# Patient Record
Sex: Male | Born: 1950 | Race: White | Hispanic: No | Marital: Married | Smoking: Never smoker
Health system: Southern US, Community
[De-identification: ages and names within clinical notes are randomized; demographics above are authoritative.]

## PROBLEM LIST (undated history)

## (undated) DIAGNOSIS — I10 Essential (primary) hypertension: Secondary | ICD-10-CM

## (undated) SURGERY — Surgical Case
Anesthesia: *Unknown

---

## 2005-01-04 ENCOUNTER — Emergency Department: Payer: Self-pay | Admitting: Internal Medicine

## 2005-01-04 ENCOUNTER — Other Ambulatory Visit: Payer: Self-pay

## 2005-01-05 ENCOUNTER — Ambulatory Visit: Payer: Self-pay | Admitting: Internal Medicine

## 2008-11-24 ENCOUNTER — Ambulatory Visit: Payer: Self-pay | Admitting: Oncology

## 2008-12-01 ENCOUNTER — Inpatient Hospital Stay: Payer: Self-pay | Admitting: Internal Medicine

## 2008-12-01 ENCOUNTER — Ambulatory Visit: Payer: Self-pay | Admitting: Internal Medicine

## 2008-12-10 ENCOUNTER — Ambulatory Visit: Payer: Self-pay | Admitting: Internal Medicine

## 2008-12-13 ENCOUNTER — Ambulatory Visit: Payer: Self-pay | Admitting: Oncology

## 2008-12-24 ENCOUNTER — Ambulatory Visit: Payer: Self-pay | Admitting: Oncology

## 2009-01-24 ENCOUNTER — Ambulatory Visit: Payer: Self-pay | Admitting: Oncology

## 2009-06-24 ENCOUNTER — Ambulatory Visit: Payer: Self-pay | Admitting: Oncology

## 2009-07-04 ENCOUNTER — Ambulatory Visit: Payer: Self-pay | Admitting: Oncology

## 2009-07-24 ENCOUNTER — Ambulatory Visit: Payer: Self-pay | Admitting: Oncology

## 2014-04-06 DIAGNOSIS — E785 Hyperlipidemia, unspecified: Secondary | ICD-10-CM | POA: Insufficient documentation

## 2014-04-06 DIAGNOSIS — R7302 Impaired glucose tolerance (oral): Secondary | ICD-10-CM | POA: Insufficient documentation

## 2015-04-07 DIAGNOSIS — Z79899 Other long term (current) drug therapy: Secondary | ICD-10-CM | POA: Insufficient documentation

## 2017-07-01 DIAGNOSIS — N182 Chronic kidney disease, stage 2 (mild): Secondary | ICD-10-CM | POA: Insufficient documentation

## 2017-12-16 DIAGNOSIS — R42 Dizziness and giddiness: Secondary | ICD-10-CM | POA: Insufficient documentation

## 2018-04-04 DIAGNOSIS — D509 Iron deficiency anemia, unspecified: Secondary | ICD-10-CM | POA: Insufficient documentation

## 2018-08-02 ENCOUNTER — Other Ambulatory Visit: Payer: Self-pay

## 2018-08-02 ENCOUNTER — Encounter: Payer: Self-pay | Admitting: Emergency Medicine

## 2018-08-02 ENCOUNTER — Emergency Department
Admission: EM | Admit: 2018-08-02 | Discharge: 2018-08-02 | Disposition: A | Discharge status: Home / Self Care | Payer: Managed Care, Other (non HMO) | Attending: Emergency Medicine | Admitting: Emergency Medicine

## 2018-08-02 ENCOUNTER — Emergency Department: Payer: Managed Care, Other (non HMO)

## 2018-08-02 DIAGNOSIS — W231XXA Caught, crushed, jammed, or pinched between stationary objects, initial encounter: Secondary | ICD-10-CM | POA: Insufficient documentation

## 2018-08-02 DIAGNOSIS — Y9389 Activity, other specified: Secondary | ICD-10-CM | POA: Insufficient documentation

## 2018-08-02 DIAGNOSIS — S62665A Nondisplaced fracture of distal phalanx of left ring finger, initial encounter for closed fracture: Secondary | ICD-10-CM | POA: Diagnosis not present

## 2018-08-02 DIAGNOSIS — Y9289 Other specified places as the place of occurrence of the external cause: Secondary | ICD-10-CM | POA: Diagnosis not present

## 2018-08-02 DIAGNOSIS — Y998 Other external cause status: Secondary | ICD-10-CM | POA: Diagnosis not present

## 2018-08-02 DIAGNOSIS — S61315A Laceration without foreign body of left ring finger with damage to nail, initial encounter: Secondary | ICD-10-CM | POA: Diagnosis not present

## 2018-08-02 HISTORY — DX: Essential (primary) hypertension: I10

## 2018-08-02 MED ORDER — BUPIVACAINE HCL 0.5 % IJ SOLN
5.0000 mL | Freq: Once | INTRAMUSCULAR | Status: DC
  Filled 2018-08-02: qty 5

## 2018-08-02 MED ORDER — BUPIVACAINE HCL (PF) 0.5 % IJ SOLN
5.0000 mL | Freq: Once | INTRAMUSCULAR | Status: AC
  Administered 2018-08-02: 12:00:00 5 mL
  Filled 2018-08-02: qty 30

## 2018-08-02 MED ORDER — CEPHALEXIN 500 MG PO CAPS: 500.0000 mg | ORAL_CAPSULE | Freq: Four times a day (QID) | ORAL | 0 refills | Status: AC

## 2018-08-02 MED ORDER — CEPHALEXIN 500 MG PO CAPS
500.0000 mg | ORAL_CAPSULE | Freq: Once | ORAL | Status: AC
  Administered 2018-08-02: 500 mg via ORAL
  Filled 2018-08-02: qty 1

## 2018-08-02 NOTE — ED Provider Notes (Signed)
[facility] Emergency Department Provider Note   ____________________________________________   First MD Initiated Contact with Patient 08/02/18 1008     (approximate)  I have reviewed the triage vital signs and the nursing notes.   HISTORY  Chief Complaint Laceration    HPI Paul Mitchell is a 68 y.o. male here for evaluation for left ring finger injury  Just about 2 hours ago, patient was splitting wood when he got his left ring finger stuck between the splinter and a piece of wood.  He reports it has not been super painful, he has had injury to the right hand with loss of part of the finger in a similar situation years ago.  Reports that seem similar, he denies any injury to the hand itself or other fingers just the left ring finger got crushed between a wooden the splitter edge.  It bled some.  Denies history of any bleeding problems or diabetes.  His last tetanus shot he reports was given in January by his primary doctor.  He is not any nausea or vomiting.  He feels a little bit of tingling on the left side of the tip of the ring finger on the left, but reports that the right side of that finger he can feel normally and is not super painful.  He has not noticed a cold or blue tip of the finger.  He does not leave any of the fingertip behind, and reports it is able to bend back-and-forth okay without difficulty but obviously is lacerated.  He went to urgent care who referred him here.   Past Medical History:  Diagnosis Date  . Hypertension     There are no active problems to display for this patient.   History reviewed. No pertinent surgical history.  Prior to Admission medications   Medication Sig Start Date End Date Taking? Authorizing Provider  aspirin EC 81 MG tablet Take 81 mg by mouth daily.   Yes [provider]  hydrochlorothiazide (MICROZIDE) 12.5 MG capsule Take 12.5 mg by mouth every morning. 04/04/18  Yes [provider]  losartan (COZAAR) 100 MG tablet Take 100 mg by mouth daily. 04/04/18  Yes [provider]  cephALEXin (KEFLEX) 500 MG capsule Take 1 capsule (500 mg total) by mouth 4 (four) times daily for 10 days. 08/02/18 08/12/18  Sharyn Creamer, MD    Allergies Enalapril  No family history on file.  Social History Social History   Tobacco Use  . Smoking status: Never Smoker  . Smokeless tobacco: Never Used  Substance Use Topics  . Alcohol use: Not Currently  . Drug use: Not Currently    Review of Systems Constitutional: No fever/chills or recent illness.  Denies any coronavirus-like symptoms. Eyes: No visual changes. Cardiovascular: Denies chest pain. Respiratory: Denies shortness of breath. Genitourinary: Negative for dysuria. Musculoskeletal: Negative for back pain.  The HPI. Skin: Negative for rash. Neurological: Negative for headaches, areas of focal weakness or numbness.    ____________________________________________   PHYSICAL EXAM:  VITAL SIGNS: ED Triage Vitals  Enc Vitals Group     BP 08/02/18 1003 110/74     Pulse Rate 08/02/18 1003 75     Resp 08/02/18 1003 16     Temp 08/02/18 1003 (!) 97.5 F (36.4 C)     Temp Source 08/02/18 1003 Oral     SpO2 08/02/18 1003 97 %     Weight 08/02/18 1001 245 lb (111.1 kg)     Height  08/02/18 1001 6\' 2"  (1.88 m)     Head Circumference --      Peak Flow --      Pain Score 08/02/18 1001 0     Pain Loc --      Pain Edu? --      Excl. in GC? --     Constitutional: Alert and oriented. Well appearing and in no acute distress. Eyes: Conjunctivae are normal. Head: Atraumatic. Nose: No congestion/rhinnorhea. Mouth/Throat: Mucous membranes are moist. Neck: No stridor.  Cardiovascular: Normal rate, regular rhythm. Grossly normal heart sounds.  Good peripheral circulation. Respiratory: Normal respiratory effort.  No retractions. Lungs CTAB. Gastrointestinal: Soft and nontender. No distention. Musculoskeleta  RIGHT  Right upper extremity demonstrates normal strength, good use of all muscles. No edema bruising or contusions of the right shoulder/upper arm, right elbow, right forearm / hand. Full range of motion of the right right upper extremity without pain. No evidence of trauma. Strong radial pulse. Intact median/ulnar/radial neuro-muscular exam.  LEFT Left upper extremity demonstrates normal strength, good use of all muscles. No edema bruising or contusions of the left shoulder/upper arm, left elbow, left forearm / hand. Full range of motion of the left  upper extremity without pain kept a little bit at the distal tuft of the left ring finger.  Strong radial pulse. Intact median/ulnar/radial neuro-muscular exam.  Patient has macerated laceration that is not completely through and through, but does expose fatty tissue with an underlying fracture of the distal tuft.  Bleeding is well controlled.  Some damage to the distal nailbed the horizontal fracturing of it but the majority of the nail in the mid to nail matrix are intact.   Neurologic:  Normal speech and language. No gross focal neurologic deficits are appreciated.  Skin:  Skin is warm, dry and intact. No rash noted. Psychiatric: Mood and affect are normal. Speech and behavior are normal.  ____________________________________________   LABS (all labs ordered are listed, but only abnormal results are displayed)  Labs Reviewed - No data to display ____________________________________________  EKG   ____________________________________________  RADIOLOGY  Dg Finger Ring Left  Result Date: 08/02/2018 CLINICAL DATA:  Patient left ring finger caught between wood splitter and wood today. Now with laceration to distal end of ring finger. wood splitter vs finger, tip injury EXAM: LEFT RING FINGER 2+V COMPARISON:  None. FINDINGS: Mildly comminuted fracture of the distal aspect of the distal phalanx. Laceration in the nail bed and inferior margin of the  distal phalanx IMPRESSION: Comminuted fracture of the tuft distal phalanx. Electronically Signed   By: Genevive BiStewart  Edmunds M.D.   On: 08/02/2018 10:38    ____________________________________________   PROCEDURES  Procedure(s) performed: laceration  .Marland Kitchen.Laceration Repair Date/Time: 08/02/2018 2:53 PM Performed by: Sharyn CreamerQuale, , MD Authorized by: Sharyn CreamerQuale, , MD   Consent:    Consent obtained:  Verbal   Consent given by:  Patient   Risks discussed:  Infection, vascular damage, poor wound healing, poor cosmetic result, need for additional repair and nerve damage Anesthesia (see MAR for exact dosages):    Anesthesia method:  Nerve block   Block location:  Digital L ring   Block needle gauge:  24 G   Block anesthetic:  Bupivacaine 0.5% w/o epi   Block injection procedure:  Anatomic landmarks identified, introduced needle, incremental injection, negative aspiration for blood and anatomic landmarks palpated   Block outcome:  Anesthesia achieved Laceration details:    Location:  Finger   Finger location:  L ring finger  Length (cm):  2   Depth (mm):  5 Repair type:    Repair type:  Simple Pre-procedure details:    Preparation:  Patient was prepped and draped in usual sterile fashion and imaging obtained to evaluate for foreign bodies Exploration:    Hemostasis achieved with:  Direct pressure   Wound exploration: wound explored through full range of motion     Wound extent: areolar tissue violated, nerve damage and underlying fracture     Wound extent: no fascia violation noted, no foreign bodies/material noted, no muscle damage noted, no tendon damage noted and no vascular damage noted     Contaminated: no   Treatment:    Area cleansed with:  Betadine and soap and water   Amount of cleaning:  Extensive   Irrigation solution:  Tap water   Irrigation method:  Tap   Visualized foreign bodies/material removed: no   Skin repair:    Repair method:  Sutures   Suture size:  4-0   Suture  material:  Prolene   Suture technique:  Simple interrupted   Number of sutures: 9. Approximation:    Approximation:  Loose Post-procedure details:    Dressing:  Non-adherent dressing and splint for protection   Patient tolerance of procedure:  Tolerated well, no immediate complications Comments:     Did discuss with the patient that it does appear that he has some damage likely to the digital nerve as he has loss of sensation over one aspect of the finger.  Patient understanding, comfortable with plan for follow-up with his primary.  Buddy taped to the adjoining finger.  Repair went very well, loosely approximated.  A single suture was placed through the center of the nailbed and good approximation was made with 9 interrupted sutures.    Critical Care performed: No  ____________________________________________   INITIAL IMPRESSION / ASSESSMENT AND PLAN / ED COURSE  Pertinent labs & imaging results that were available during my care of the patient were reviewed by me and considered in my medical decision making (see chart for details).   Isolated injury to the left index finger distal.  Underlying fracture is evident consistent with his mechanism of injury.  Though the tissue is somewhat macerated, I was able to realign and get fairly good alignment considering the amount of tissue damage.  There is no obvious tendon injury and is able to flex and extend the entirety of the left ring finger.  I repaired this, discussed very careful return precautions, follow-up signs and symptoms of infection and provided antibiotic.  Patient is in good agreement.     Paul Mitchell was evaluated in Emergency Department on 08/02/2018 for the symptoms described in the history of present illness. He was evaluated in the context of the global COVID-19 pandemic, which necessitated consideration that the patient might be at risk for infection with the SARS-CoV-2 virus that causes COVID-19. Institutional protocols  and algorithms that pertain to the evaluation of patients at risk for COVID-19 are in a state of rapid change based on information released by regulatory bodies including the CDC and federal and state organizations. These policies and algorithms were followed during the patient's care in the ED.  Return precautions and treatment recommendations and follow-up discussed with the patient who is agreeable with the plan.  ____________________________________________   FINAL CLINICAL IMPRESSION(S) / ED DIAGNOSES  Final diagnoses:  Laceration of left ring finger without foreign body with damage to nail, initial encounter  Closed nondisplaced fracture of distal  phalanx of left ring finger, initial encounter        Note:  This document was prepared using Dragon voice recognition software and may include unintentional dictation errors       Sharyn Creamer, MD 08/02/18 1501

## 2018-08-02 NOTE — ED Triage Notes (Signed)
Pt to ED via POV c/o laceration to the left middle finger. Pt states that he was splitting wood this morning and accidentally cut his finger. Tip of finger is almost cut completely off. Bleeding is controlled at this time.

## 2018-08-02 NOTE — ED Notes (Signed)
Pt finger that is injured is L ring finger, NOT L middle finger.

## 2018-08-02 NOTE — Discharge Instructions (Addendum)
You have been seen in the Emergency Department (ED) today for a laceration (cut).  Please keep the cut clean but do not submerge it in the water.  It has been repaired with staples or sutures that will need to be removed in about 7-9 days. Please follow up with your doctor, an urgent care, or return to the ED for suture removal.    Please take Tylenol (acetaminophen) or Motrin (ibuprofen) as needed for discomfort as written on the box.   Please follow up with your doctor as soon as possible regarding today's emergent visit.   Return to the ED or call your doctor if you notice any signs of infection such as fever, increased pain, increased redness, pus, or other symptoms that concern you.

## 2020-02-19 ENCOUNTER — Emergency Department: Payer: Managed Care, Other (non HMO)

## 2020-02-19 ENCOUNTER — Other Ambulatory Visit: Payer: Self-pay

## 2020-02-19 ENCOUNTER — Emergency Department
Admission: EM | Admit: 2020-02-19 | Discharge: 2020-02-19 | Disposition: A | Discharge status: Home / Self Care | Payer: Managed Care, Other (non HMO) | Attending: Emergency Medicine | Admitting: Emergency Medicine

## 2020-02-19 DIAGNOSIS — R031 Nonspecific low blood-pressure reading: Secondary | ICD-10-CM | POA: Diagnosis not present

## 2020-02-19 DIAGNOSIS — Z5321 Procedure and treatment not carried out due to patient leaving prior to being seen by health care provider: Secondary | ICD-10-CM | POA: Insufficient documentation

## 2020-02-19 DIAGNOSIS — R531 Weakness: Secondary | ICD-10-CM | POA: Insufficient documentation

## 2020-02-19 LAB — BASIC METABOLIC PANEL
Anion gap: 11 (ref 5–15)
BUN: 30 mg/dL — ABNORMAL HIGH (ref 8–23)
CO2: 26 mmol/L (ref 22–32)
Calcium: 8.4 mg/dL — ABNORMAL LOW (ref 8.9–10.3)
Chloride: 95 mmol/L — ABNORMAL LOW (ref 98–111)
Creatinine, Ser: 1.65 mg/dL — ABNORMAL HIGH (ref 0.61–1.24)
GFR, Estimated: 45 mL/min — ABNORMAL LOW (ref >60)
Glucose, Bld: 103 mg/dL — ABNORMAL HIGH (ref 70–99)
Potassium: 3.6 mmol/L (ref 3.5–5.1)
Sodium: 132 mmol/L — ABNORMAL LOW (ref 135–145)

## 2020-02-19 LAB — CBC
HCT: 41.5 % (ref 39.0–52.0)
Hemoglobin: 14.2 g/dL (ref 13.0–17.0)
MCH: 30.6 pg (ref 26.0–34.0)
MCHC: 34.2 g/dL (ref 30.0–36.0)
MCV: 89.4 fL (ref 80.0–100.0)
Platelets: 126 10*3/uL — ABNORMAL LOW (ref 150–400)
RBC: 4.64 MIL/uL (ref 4.22–5.81)
RDW: 13.7 % (ref 11.5–15.5)
WBC: 5.1 10*3/uL (ref 4.0–10.5)
nRBC: 0 % (ref 0.0–0.2)

## 2020-02-19 NOTE — ED Triage Notes (Signed)
Pt comes via WC from [facility] with c/o weakness. Pt states he has bee nfeeling weak for few days. Pt states he went to walk in clinic to get checked out. Pt states they told him his BP was low and his O2.   Pt denies any CP or SOB, N/V/D.  Pt arrived with 2L [place] unsure what O2 reading was from [facility].

## 2020-02-22 ENCOUNTER — Telehealth: Payer: Self-pay | Admitting: Emergency Medicine

## 2020-02-22 NOTE — Telephone Encounter (Signed)
Called patient due to left emergency department before provider exam to inquire about condition and follow up plans. He says he has called his pcp this am and is going at 1pm today.

## 2020-02-24 ENCOUNTER — Inpatient Hospital Stay
Admission: EM | Admit: 2020-02-24 | Discharge: 2020-02-29 | DRG: 177 | Disposition: A | Discharge status: Home Health | Payer: Managed Care, Other (non HMO) | Attending: Internal Medicine | Admitting: Internal Medicine

## 2020-02-24 ENCOUNTER — Emergency Department: Payer: Managed Care, Other (non HMO)

## 2020-02-24 ENCOUNTER — Encounter: Payer: Self-pay | Admitting: Intensive Care

## 2020-02-24 ENCOUNTER — Other Ambulatory Visit: Payer: Self-pay

## 2020-02-24 DIAGNOSIS — G8929 Other chronic pain: Secondary | ICD-10-CM | POA: Diagnosis present

## 2020-02-24 DIAGNOSIS — J1282 Pneumonia due to coronavirus disease 2019: Secondary | ICD-10-CM | POA: Diagnosis present

## 2020-02-24 DIAGNOSIS — A419 Sepsis, unspecified organism: Secondary | ICD-10-CM | POA: Diagnosis not present

## 2020-02-24 DIAGNOSIS — N179 Acute kidney failure, unspecified: Secondary | ICD-10-CM | POA: Diagnosis not present

## 2020-02-24 DIAGNOSIS — I10 Essential (primary) hypertension: Secondary | ICD-10-CM | POA: Diagnosis present

## 2020-02-24 DIAGNOSIS — J9611 Chronic respiratory failure with hypoxia: Secondary | ICD-10-CM | POA: Insufficient documentation

## 2020-02-24 DIAGNOSIS — U071 COVID-19: Secondary | ICD-10-CM | POA: Diagnosis present

## 2020-02-24 DIAGNOSIS — Z7982 Long term (current) use of aspirin: Secondary | ICD-10-CM

## 2020-02-24 DIAGNOSIS — Z79899 Other long term (current) drug therapy: Secondary | ICD-10-CM | POA: Diagnosis not present

## 2020-02-24 DIAGNOSIS — R7989 Other specified abnormal findings of blood chemistry: Secondary | ICD-10-CM | POA: Diagnosis not present

## 2020-02-24 DIAGNOSIS — I824Y2 Acute embolism and thrombosis of unspecified deep veins of left proximal lower extremity: Secondary | ICD-10-CM | POA: Diagnosis not present

## 2020-02-24 DIAGNOSIS — J9601 Acute respiratory failure with hypoxia: Secondary | ICD-10-CM | POA: Diagnosis present

## 2020-02-24 DIAGNOSIS — I4891 Unspecified atrial fibrillation: Secondary | ICD-10-CM | POA: Diagnosis not present

## 2020-02-24 LAB — CBC
HCT: 40.6 % (ref 39.0–52.0)
Hemoglobin: 13.9 g/dL (ref 13.0–17.0)
MCH: 30.1 pg (ref 26.0–34.0)
MCHC: 34.2 g/dL (ref 30.0–36.0)
MCV: 87.9 fL (ref 80.0–100.0)
Platelets: 196 10*3/uL (ref 150–400)
RBC: 4.62 MIL/uL (ref 4.22–5.81)
RDW: 13.3 % (ref 11.5–15.5)
WBC: 6.7 10*3/uL (ref 4.0–10.5)
nRBC: 0 % (ref 0.0–0.2)

## 2020-02-24 LAB — BASIC METABOLIC PANEL
Anion gap: 10 (ref 5–15)
BUN: 22 mg/dL (ref 8–23)
CO2: 26 mmol/L (ref 22–32)
Calcium: 7.8 mg/dL — ABNORMAL LOW (ref 8.9–10.3)
Chloride: 99 mmol/L (ref 98–111)
Creatinine, Ser: 1.09 mg/dL (ref 0.61–1.24)
GFR, Estimated: >60 mL/min (ref >60)
Glucose, Bld: 124 mg/dL — ABNORMAL HIGH (ref 70–99)
Potassium: 3.5 mmol/L (ref 3.5–5.1)
Sodium: 135 mmol/L (ref 135–145)

## 2020-02-24 LAB — RESP PANEL BY RT-PCR (FLU A&B, COVID) ARPGX2
Influenza A by PCR: NEGATIVE
Influenza B by PCR: NEGATIVE
SARS Coronavirus 2 by RT PCR: POSITIVE — AB

## 2020-02-24 LAB — LACTIC ACID, PLASMA: Lactic Acid, Venous: 1.7 mmol/L (ref 0.5–1.9)

## 2020-02-24 LAB — PROCALCITONIN: Procalcitonin: 0.1 ng/mL

## 2020-02-24 LAB — FIBRIN DERIVATIVES D-DIMER (ARMC ONLY): Fibrin derivatives D-dimer (ARMC): 1281.86 ng/mL (FEU) — ABNORMAL HIGH (ref 0.00–499.00)

## 2020-02-24 MED ORDER — GUAIFENESIN-DM 100-10 MG/5ML PO SYRP: 10.0000 mL | ORAL_SOLUTION | ORAL | Status: DC | PRN

## 2020-02-24 MED ORDER — HYDROCOD POLST-CPM POLST ER 10-8 MG/5ML PO SUER
5.0000 mL | Freq: Two times a day (BID) | ORAL | Status: DC | PRN
  Administered 2020-02-26: 5 mL via ORAL
  Filled 2020-02-24: qty 5

## 2020-02-24 MED ORDER — SODIUM CHLORIDE 0.9 % IV SOLN
100.0000 mg | Freq: Every day | INTRAVENOUS | Status: AC
  Administered 2020-02-25 – 2020-02-28 (×4): 100 mg via INTRAVENOUS
  Filled 2020-02-24 (×5): qty 20

## 2020-02-24 MED ORDER — METHYLPREDNISOLONE SODIUM SUCC 125 MG IJ SOLR
1.0000 mg/kg | Freq: Two times a day (BID) | INTRAMUSCULAR | Status: AC
  Administered 2020-02-24 – 2020-02-27 (×6): 111.25 mg via INTRAVENOUS
  Filled 2020-02-24 (×6): qty 2

## 2020-02-24 MED ORDER — ONDANSETRON HCL 4 MG/2ML IJ SOLN: 4.0000 mg | Freq: Four times a day (QID) | INTRAMUSCULAR | Status: DC | PRN

## 2020-02-24 MED ORDER — DEXAMETHASONE SODIUM PHOSPHATE 10 MG/ML IJ SOLN
10.0000 mg | Freq: Once | INTRAMUSCULAR | Status: AC
  Administered 2020-02-24: 10 mg via INTRAVENOUS
  Filled 2020-02-24: qty 1

## 2020-02-24 MED ORDER — ONDANSETRON HCL 4 MG PO TABS: 4.0000 mg | ORAL_TABLET | Freq: Four times a day (QID) | ORAL | Status: DC | PRN

## 2020-02-24 MED ORDER — ACETAMINOPHEN 325 MG PO TABS: 325.0000 mg | ORAL_TABLET | Freq: Four times a day (QID) | ORAL | Status: DC | PRN

## 2020-02-24 MED ORDER — SODIUM CHLORIDE 0.9 % IV SOLN
200.0000 mg | Freq: Once | INTRAVENOUS | Status: AC
  Administered 2020-02-24: 200 mg via INTRAVENOUS
  Filled 2020-02-24: qty 40

## 2020-02-24 MED ORDER — PREDNISONE 50 MG PO TABS
50.0000 mg | ORAL_TABLET | Freq: Every day | ORAL | Status: DC
  Administered 2020-02-27 – 2020-02-29 (×3): 50 mg via ORAL
  Filled 2020-02-24 (×3): qty 1

## 2020-02-24 MED ORDER — ENOXAPARIN SODIUM 60 MG/0.6ML ~~LOC~~ SOLN
0.5000 mg/kg | SUBCUTANEOUS | Status: DC
  Administered 2020-02-24 – 2020-02-28 (×5): 55 mg via SUBCUTANEOUS
  Filled 2020-02-24 (×6): qty 0.6

## 2020-02-24 MED ORDER — LACTATED RINGERS IV BOLUS
1000.0000 mL | Freq: Once | INTRAVENOUS | Status: AC
  Administered 2020-02-24: 1000 mL via INTRAVENOUS

## 2020-02-24 MED ORDER — ASPIRIN EC 81 MG PO TBEC
81.0000 mg | DELAYED_RELEASE_TABLET | Freq: Every day | ORAL | Status: DC
  Administered 2020-02-25 – 2020-02-29 (×5): 81 mg via ORAL
  Filled 2020-02-24 (×5): qty 1

## 2020-02-24 MED ORDER — LOSARTAN POTASSIUM 50 MG PO TABS
100.0000 mg | ORAL_TABLET | Freq: Every day | ORAL | Status: DC
  Administered 2020-02-26 – 2020-02-29 (×3): 100 mg via ORAL
  Filled 2020-02-24 (×5): qty 2

## 2020-02-24 MED ORDER — HYDROCHLOROTHIAZIDE 12.5 MG PO CAPS
12.5000 mg | ORAL_CAPSULE | Freq: Every morning | ORAL | Status: DC
  Administered 2020-02-25 – 2020-02-29 (×4): 12.5 mg via ORAL
  Filled 2020-02-24 (×5): qty 1

## 2020-02-24 NOTE — H&P (Signed)
History and Physical   Paul Mitchell:[id number] DOB: 07/27/1950 DOA: 02/24/2020  PCP: [facility] Specialists: Dr. Bennie [facility] Patient coming from: home   I have personally briefly reviewed patient's old medical records in [facility] EMR.  Chief Concern: worsening shortness of breath since Monday, 02/22/20.  HPI: Paul Mitchell is a 69 y.o. male with medical history significant for hypertension presents to the ED with chief concerns of shortness of breath and low energy on Monday, 02/22/20 and was tested positive for [facility].   He reports he was tested for Covid on 02/22/2020 and results came back today showing that he tested positive for Covid.  He felt worsening shortness of breath and therefore prompted him to present to the ED for further evaluation.  He reports improved symptoms with O2 supplementations in the emergency department.  ROS denies fever, headache, vision changes, chest pain, abdominal pain, nausea, vomiting, hearing loss, dysphagia, odynophagia, dysuria, hematuria, hematuria, diarrhea, constipation, blood in stool. He endorses intact sense of smell and taste. He denies extremity weakness. He denies muscle aches.   [name] endorses that he will get vaccinated for COVID-19 after discharge.   ED Course: Cussed with ED provider, requesting admission for COVID-19 pneumonia causing acute hypoxic respiratory failure.  Review of Systems: As per HPI otherwise 10 point review of systems negative.  Assessment/Plan  Principal Problem:   Acute hypoxemic respiratory failure due to COVID-19 [facility]) Active Problems:   Essential hypertension   Acute hypoxic respiratory failure secondary to covid 19  - Remdesivir pharmacy consult and decadron per ED provider - Continue remdesivir and solumedrol IV - Admit to inpatient progressive cardiac -Continue high flow nasal cannula to maintain SPO2 greater than  92%  Hypertension - resumed home hctz 12.5 mg daily and losartan 100 mg daily  Sinus tachycardia secondary to acute hypoxic respiratory failure secondary to covid 19 - Treat as above   Chart reviewed.   DVT prophylaxis: enoxaparin 55 mg subcutaneous Code Status: full code Diet: heart healthy Family Communication: updated spouse, Mrs. Tenoch Mcclure. Mrs. Latin states she has covid infection as well  Disposition Plan: pending clinical course Consults called: not at this time Admission status: inpatient to progressive cardiac  Past Medical History:  Diagnosis Date  . Hypertension    History reviewed. No pertinent surgical history.  Social History:  reports that he has never smoked. He has never used smokeless tobacco. He reports previous alcohol use. He reports previous drug use.  Allergies  Allergen Reactions  . Enalapril Cough   History reviewed. No pertinent family history. Family history: Family history reviewed and not pertinent  Prior to Admission medications   Medication Sig Start Date End Date Taking? Authorizing Provider  aspirin EC 81 MG tablet Take 81 mg by mouth daily.    [provider]  hydrochlorothiazide (MICROZIDE) 12.5 MG capsule Take 12.5 mg by mouth every morning. 04/04/18   [provider]  losartan (COZAAR) 100 MG tablet Take 100 mg by mouth daily. 04/04/18   [provider]   Physical Exam: Vitals:   02/24/20 1514 02/24/20 1530 02/24/20 1535 02/24/20 1645  BP:   135/71 116/70  Pulse:   99 97  Resp:   (!) 22 (!) 28  Temp:      TempSrc:      SpO2: (!) 88% 95% 91% 94%  Weight:      Height:       Constitutional: appears age appropriate, NAD, calm,  comfortable Eyes: PERRL, lids and conjunctivae normal ENMT: Mucous membranes are moist. Posterior pharynx clear of any exudate or lesions. Age-appropriate dentition. Hearing appropriate Neck: normal, supple, no masses, no thyromegaly Respiratory: bilateral decreased breath sounds.  Normal respiratory effort. mild accessory muscle use.  Cardiovascular: Regular rate and rhythm, no murmurs / rubs / gallops. No extremity edema. 2+ pedal pulses. No carotid bruits.  Abdomen: no tenderness, no masses palpated, no hepatosplenomegaly. Bowel sounds positive.  Musculoskeletal: no clubbing / cyanosis. No joint deformity upper and lower extremities. Good ROM, no contractures, no atrophy. Normal muscle tone.  Skin: no rashes, lesions, ulcers. No induration Neurologic: Sensation intact. Strength 5/5 in all 4.  Psychiatric: Normal judgment and insight. Alert and oriented x 3. Normal mood.   EKG: Independently reviewed, showing sinus tachycardia with rate of 101 and Qtc 444  Chest x-ray on Admission: Personally reviewed and I agree with radiologist reading as below.  DG Chest Port 1 View  Result Date: 02/24/2020 CLINICAL DATA:  69 year old male with shortness of breath. EXAM: PORTABLE CHEST 1 VIEW COMPARISON:  Chest radiograph dated 02/19/2020. FINDINGS: Significant interval worsening of bilateral pulmonary infiltrate compared to prior radiograph in keeping with worsening multifocal pneumonia. Clinical correlation and follow-up to resolution recommended. No pleural effusion pneumothorax. Stable cardiac silhouette. Atherosclerotic calcification of the aorta. No acute osseous pathology. IMPRESSION: Worsening multifocal pneumonia. Electronically Signed   By: Elgie Collard M.D.   On: 02/24/2020 15:34   Labs on Admission: I have personally reviewed following labs  CBC: Recent Labs  Lab 02/19/20 1253 02/24/20 1528  WBC 5.1 6.7  HGB 14.2 13.9  HCT 41.5 40.6  MCV 89.4 87.9  PLT 126* 196   Basic Metabolic Panel: Recent Labs  Lab 02/19/20 1253 02/24/20 1528  NA 132* 135  K 3.6 3.5  CL 95* 99  CO2 26 26  GLUCOSE 103* 124*  BUN 30* 22  CREATININE 1.65* 1.09  CALCIUM 8.4* 7.8*   Zayn Selley N Dorianne Perret D.O. [facility]  If 12AM-7AM, please contact overnight-coverage provider If  7AM-7PM, please contact day coverage provider [email or web address]  02/24/2020, 5:09 PM

## 2020-02-24 NOTE — ED Provider Notes (Signed)
[facility] Emergency Department Provider Note   ____________________________________________   First MD Initiated Contact with Patient 02/24/20 1502     (approximate)  I have reviewed the triage vital signs and the nursing notes.   HISTORY  Chief Complaint Shortness of Breath and Covid Positive    HPI Paul Mitchell is a 69 y.o. male history of hypertension  Patient comes for evaluation due to concerns of COVID-19.  Reports shortness of breath and fatigue worsening over the last 2 days.  Symptoms were support started just a couple of days ago, tested positive for Covid at his doctor's office 2 days ago  No chest pain.  Does report shortness of breath.  Improved on oxygen.  No muscle aches or pains.  Reports he just has a dry cough and shortness of breath that are worsening   Past Medical History:  Diagnosis Date  . Hypertension     Patient Active Problem List   Diagnosis Date Noted  . Acute hypoxemic respiratory failure due to COVID-19 [facility]) 02/24/2020    History reviewed. No pertinent surgical history.  Prior to Admission medications   Medication Sig Start Date End Date Taking? Authorizing Provider  aspirin EC 81 MG tablet Take 81 mg by mouth daily.    [provider]  hydrochlorothiazide (MICROZIDE) 12.5 MG capsule Take 12.5 mg by mouth every morning. 04/04/18   [provider]  losartan (COZAAR) 100 MG tablet Take 100 mg by mouth daily. 04/04/18   [provider]    Allergies Enalapril  History reviewed. No pertinent family history.  Social History Social History   Tobacco Use  . Smoking status: Never Smoker  . Smokeless tobacco: Never Used  Substance Use Topics  . Alcohol use: Not Currently  . Drug use: Not Currently    Review of Systems Constitutional: No fever/chills but feeling fatigued Eyes: No visual changes. ENT: No sore throat. Cardiovascular: Denies chest pain. Respiratory: See  HPI Gastrointestinal: No abdominal pain.   Genitourinary: Negative for dysuria. Musculoskeletal: Negative for back pain. Skin: Negative for rash. Neurological: Negative for headaches, areas of focal weakness or numbness.    ____________________________________________   PHYSICAL EXAM:  VITAL SIGNS: ED Triage Vitals  Enc Vitals Group     BP 02/24/20 1456 111/68     Pulse Rate 02/24/20 1456 (!) 110     Resp 02/24/20 1456 (!) 27     Temp 02/24/20 1456 99.8 F (37.7 C)     Temp Source 02/24/20 1456 Oral     SpO2 02/24/20 1456 (!) 79 %     Weight 02/24/20 1506 245 lb (111.1 kg)     Height 02/24/20 1506 6\' 3"  (1.905 m)     Head Circumference --      Peak Flow --      Pain Score 02/24/20 1456 0     Pain Loc --      Pain Edu? --      Excl. in GC? --     Constitutional: Alert and oriented.  Teague in appearance slight increased work of breathing but no frank distress Eyes: Conjunctivae are normal. Head: Atraumatic. Nose: No congestion/rhinnorhea. Mouth/Throat: Mucous membranes are moist. Neck: No stridor.  Cardiovascular: Slightly tachycardic rate, regular rhythm. Grossly normal heart sounds.  Good peripheral circulation. Respiratory: Moderately tachypneic, lung sounds clear bilaterally.  Mild use of accessory muscles.  Of note patient hypoxic saturations less than 80% on room air, correct to low 90s on 5 L nasal cannula.  Gastrointestinal: Soft and nontender. No distention. Musculoskeletal: No lower extremity tenderness nor edema. Neurologic:  Normal speech and language. No gross focal neurologic deficits are appreciated.  Skin:  Skin is warm, dry and intact. No rash noted. Psychiatric: Mood and affect are normal. Speech and behavior are normal.  ____________________________________________   LABS (all labs ordered are listed, but only abnormal results are displayed)  Labs Reviewed  BASIC METABOLIC PANEL - Abnormal; Notable for the following components:      Result Value    Glucose, Bld 124 (*)    Calcium 7.8 (*)    All other components within normal limits  CULTURE, BLOOD (ROUTINE X 2)  CULTURE, BLOOD (ROUTINE X 2)  CBC  LACTIC ACID, PLASMA  PROCALCITONIN   ____________________________________________  EKG  Reviewed interpreted by me at 1520 Heart rate 100 QRS 110 QTc 440 Sinus tachycardia, no evidence of acute ischemia. ____________________________________________  RADIOLOGY  DG Chest Port 1 View  Result Date: 02/24/2020 CLINICAL DATA:  69 year old male with shortness of breath. EXAM: PORTABLE CHEST 1 VIEW COMPARISON:  Chest radiograph dated 02/19/2020. FINDINGS: Significant interval worsening of bilateral pulmonary infiltrate compared to prior radiograph in keeping with worsening multifocal pneumonia. Clinical correlation and follow-up to resolution recommended. No pleural effusion pneumothorax. Stable cardiac silhouette. Atherosclerotic calcification of the aorta. No acute osseous pathology. IMPRESSION: Worsening multifocal pneumonia. Electronically Signed   By: Elgie Collard M.D.   On: 02/24/2020 15:34    Chest x-ray reviewed concerning for multifocal pneumonia and infiltrate. ____________________________________________   PROCEDURES  Procedure(s) performed: None  Procedures  Critical Care performed: Yes, see critical care note(s)  CRITICAL CARE Performed by: Sharyn Creamer   Total critical care time: 30 minutes  Critical care time was exclusive of separately billable procedures and treating other patients.  Critical care was necessary to treat or prevent imminent or life-threatening deterioration.  Critical care was time spent personally by me on the following activities: development of treatment plan with patient and/or surrogate as well as nursing, discussions with consultants, evaluation of patient's response to treatment, examination of patient, obtaining history from patient or surrogate, ordering and performing treatments  and interventions, ordering and review of laboratory studies, ordering and review of radiographic studies, pulse oximetry and re-evaluation of patient's condition.  ____________________________________________   INITIAL IMPRESSION / ASSESSMENT AND PLAN / ED COURSE  Pertinent labs & imaging results that were available during my care of the patient were reviewed by me and considered in my medical decision making (see chart for details).   Patient Modena Jansky for dyspnea, found to be notably hypoxic.  Known COVID-19 tested positive just couple days ago in [employer] reviewed.  He has not initiated any Covid treatments.  Discussed with the patient he is agreeable to proceed with remdesivir and steroids  Additionally will rule out bacterial etiologies though I have a low suspicion for this.  Awaiting procalcitonin, additionally awaiting blood cultures was have been ordered.  Fluid bolus lactic acid pending.  Admission discussed with Dr. Sedalia Muta.  Test such as procalcitonin blood culture results etc. are pending at time of admission decision  ----------------------------------------- 4:21 PM on 02/24/2020 -----------------------------------------  Patient respiratory status improving.  Tolerating high flow heated oxygenation well, respiratory rate has improved markedly and his work of breathing appears much more comfortable.  Patient comfortable, awaiting admission to hospital      ____________________________________________   FINAL CLINICAL IMPRESSION(S) / ED DIAGNOSES  Final diagnoses:  Sepsis, due to unspecified organism, unspecified whether acute organ  dysfunction present [facility])  Pneumonia due to COVID-19 virus        Note:  This document was prepared using Dragon voice recognition software and may include unintentional dictation errors       Sharyn Creamer, MD 02/24/20 1621

## 2020-02-24 NOTE — Progress Notes (Signed)
PHARMACIST - PHYSICIAN COMMUNICATION  CONCERNING:  Enoxaparin (Lovenox) for DVT Prophylaxis    RECOMMENDATION: Patient was prescribed enoxaparin 40 mg q24 hours for VTE prophylaxis.   Filed Weights   02/24/20 1506  Weight: 111.1 kg (245 lb)    Body mass index is 30.62 kg/m.  Estimated Creatinine Clearance: 88.5 mL/min (by C-G formula based on SCr of 1.09 mg/dL).   Based on [facility] policy patient is candidate for enoxaparin 0.5mg /kg TBW SQ every 24 hours based on BMI being >30.   DESCRIPTION: Pharmacy has adjusted enoxaparin dose per [facility] policy.  Patient is now receiving enoxaparin 55 mg every 24 hours    Pricilla Riffle, PharmD Clinical Pharmacist  02/24/2020 4:37 PM

## 2020-02-24 NOTE — ED Triage Notes (Signed)
Patient arrived sob from home. Tested for covid X2 days ago and results came back today positive. Reports O2 at home 83-87%. Patient has been feeling sick since 02/17/2020.

## 2020-02-24 NOTE — Consult Note (Signed)
Remdesivir - Pharmacy Brief Note   O:  CXR: significant worsening of bilateral pulmonary infiltration c/f multifocal PNA or Covid PNA SpO2: 91% on 40+ HFNC   A/P:  Remdesivir 200 mg IVPB once followed by 100 mg IVPB daily x 4 days.   .me 02/24/2020 3:57 PM

## 2020-02-24 NOTE — ED Notes (Signed)
RT called for heated high flow oxygen

## 2020-02-25 DIAGNOSIS — U071 COVID-19: Secondary | ICD-10-CM | POA: Diagnosis not present

## 2020-02-25 DIAGNOSIS — J9601 Acute respiratory failure with hypoxia: Secondary | ICD-10-CM | POA: Diagnosis not present

## 2020-02-25 LAB — C-REACTIVE PROTEIN: CRP: 7.1 mg/dL — ABNORMAL HIGH (ref <1.0)

## 2020-02-25 LAB — HIV ANTIBODY (ROUTINE TESTING W REFLEX): HIV Screen 4th Generation wRfx: NONREACTIVE

## 2020-02-25 MED ORDER — BARICITINIB 2 MG PO TABS
2.0000 mg | ORAL_TABLET | Freq: Every day | ORAL | Status: DC
  Filled 2020-02-25: qty 1

## 2020-02-25 MED ORDER — ORAL CARE MOUTH RINSE
15.0000 mL | Freq: Two times a day (BID) | OROMUCOSAL | Status: DC
  Administered 2020-02-25 – 2020-02-29 (×7): 15 mL via OROMUCOSAL

## 2020-02-25 MED ORDER — BARICITINIB 2 MG PO TABS
4.0000 mg | ORAL_TABLET | Freq: Every day | ORAL | Status: DC
  Administered 2020-02-25 – 2020-02-29 (×5): 4 mg via ORAL
  Filled 2020-02-25 (×5): qty 2

## 2020-02-25 MED ORDER — FUROSEMIDE 10 MG/ML IJ SOLN
40.0000 mg | Freq: Once | INTRAMUSCULAR | Status: AC
  Administered 2020-02-25: 40 mg via INTRAVENOUS
  Filled 2020-02-25: qty 4

## 2020-02-25 NOTE — Progress Notes (Signed)
PROGRESS NOTE    Paul Mitchell  ZJI:[id number] DOB: 25-May-1950 DOA: 02/24/2020 PCP: Patient, No Pcp Per   Brief Narrative:   69 y.o. male with medical history significant for hypertension presents to the ED with chief concerns of shortness of breath and low energy on Monday, 02/22/20 and was tested positive for [facility].   He reports he was tested for Covid on 02/22/2020 and results came back today showing that he tested positive for Covid.  He felt worsening shortness of breath and therefore prompted him to present to the ED for further evaluation.  12/2: Patient has been on heated high flow nasal cannula at 60 L overnight.  Saturations low to mid 90s on this rate.  Patient reports improvement in respiratory symptoms since initiation of antivirals, steroids, supplemental oxygen   Assessment & Plan:   Principal Problem:   Acute hypoxemic respiratory failure due to COVID-19 [facility]) Active Problems:   Essential hypertension  Multifocal pneumonia secondary to COVID-19 Acute hypoxic respiratory failure secondary to above Patient has required heated high flow nasal cannula since admission Normal work of breathing, intact mentation Chest x-ray findings consistent with multifocal infiltrate Procalcitonin negative, do not suspect bacterial coinfection Plan: Continue remdesivir, day 2/5 Continue steroids, day 2/10 Add baricitinib, day 1/14 High rate oxygen, wean as tolerated Stress incentive spirometry and flutter valve use Prone as tolerated Diet laboratory markers Lasix 40 mg IV x1 today, reassess daily for diuretic need and tolerance  Essential hypertension Continue home hydrochlorothiazide 12.5 mg daily Normal start 100 mg daily  Tachycardia Sinus rhythm on telemetry Suspect secondary to hypoxia and acute illness Treat acute issues as above  DVT prophylaxis: Lovenox Code Status: Full code Family Communication: None today.  Offered to call patient's spouse but he  declined.  Will revisit tomorrow Disposition Plan: Status is: Inpatient  Remains inpatient appropriate because:Inpatient level of care appropriate due to severity of illness   Dispo: The patient is from: Home              Anticipated d/c is to: Home              Anticipated d/c date is: > 3 days              Patient currently is not medically stable to d/c.   Remains markedly hypoxic dependent on heated high flow nasal cannula.  Not medically stable for discharge      Consultants:   None  Procedures:   None  Antimicrobials:   Remdesivir   Subjective: Patient seen and examined.  Reports improvement in respiratory status since admission.  Reports chronic pain in left hip.  Otherwise asymptomatic  Objective: Vitals:   02/25/20 1200 02/25/20 1224 02/25/20 1230 02/25/20 1300  BP: 111/66 114/66 104/71 113/77  Pulse: 89 80 81 79  Resp: (!) 23 (!) 21 18 (!) 24  Temp:  98 F (36.7 C)    TempSrc:  Oral    SpO2: 94% 95% 94% 99%  Weight:      Height:        Intake/Output Summary (Last 24 hours) at 02/25/2020 1338 Last data filed at 02/25/2020 1200 Gross per 24 hour  Intake 99.93 ml  Output 550 ml  Net -450.07 ml   Filed Weights   02/24/20 1506  Weight: 111.1 kg    Examination:  General exam: Appears calm and comfortable  Respiratory system: Coarse crackles bilaterally.  Normal work of breathing.  60 L Cardiovascular system: S1 & S2 heard,  RRR. No JVD, murmurs, rubs, gallops or clicks. No pedal edema. Gastrointestinal system: Abdomen is nondistended, soft and nontender. No organomegaly or masses felt. Normal bowel sounds heard. Central nervous system: Alert and oriented. No focal neurological deficits. Extremities: Symmetric 5 x 5 power. Skin: No rashes, lesions or ulcers Psychiatry: Judgement and insight appear normal. Mood & affect appropriate.     Data Reviewed: I have personally reviewed following labs and imaging studies  CBC: Recent Labs  Lab  02/19/20 1253 02/24/20 1528  WBC 5.1 6.7  HGB 14.2 13.9  HCT 41.5 40.6  MCV 89.4 87.9  PLT 126* 196   Basic Metabolic Panel: Recent Labs  Lab 02/19/20 1253 02/24/20 1528  NA 132* 135  K 3.6 3.5  CL 95* 99  CO2 26 26  GLUCOSE 103* 124*  BUN 30* 22  CREATININE 1.65* 1.09  CALCIUM 8.4* 7.8*   GFR: Estimated Creatinine Clearance: 88.5 mL/min (by C-G formula based on SCr of 1.09 mg/dL). Liver Function Tests: No results for input(s): AST, ALT, ALKPHOS, BILITOT, PROT, ALBUMIN in the last 168 hours. No results for input(s): LIPASE, AMYLASE in the last 168 hours. No results for input(s): AMMONIA in the last 168 hours. Coagulation Profile: No results for input(s): INR, PROTIME in the last 168 hours. Cardiac Enzymes: No results for input(s): CKTOTAL, CKMB, CKMBINDEX, TROPONINI in the last 168 hours. BNP (last 3 results) No results for input(s): PROBNP in the last 8760 hours. HbA1C: No results for input(s): HGBA1C in the last 72 hours. CBG: No results for input(s): GLUCAP in the last 168 hours. Lipid Profile: No results for input(s): CHOL, HDL, LDLCALC, TRIG, CHOLHDL, LDLDIRECT in the last 72 hours. Thyroid Function Tests: No results for input(s): TSH, T4TOTAL, FREET4, T3FREE, THYROIDAB in the last 72 hours. Anemia Panel: No results for input(s): VITAMINB12, FOLATE, FERRITIN, TIBC, IRON, RETICCTPCT in the last 72 hours. Sepsis Labs: Recent Labs  Lab 02/24/20 1528 02/24/20 1544  PROCALCITON 0.10  --   LATICACIDVEN  --  1.7    Recent Results (from the past 240 hour(s))  Culture, blood (Routine X 2) w Reflex to ID Panel     Status: None (Preliminary result)   Collection Time: 02/24/20  3:44 PM   Specimen: BLOOD  Result Value Ref Range Status   Specimen Description BLOOD RIGHT ANTECUBITAL  Final   Special Requests   Final    BOTTLES DRAWN AEROBIC AND ANAEROBIC Blood Culture adequate volume   Culture   Final    NO GROWTH < 24 HOURS Performed at [facility],  [address] [place], [place] [address]    Report Status PENDING  Incomplete  Culture, blood (Routine X 2) w Reflex to ID Panel     Status: None (Preliminary result)   Collection Time: 02/24/20  3:44 PM   Specimen: BLOOD  Result Value Ref Range Status   Specimen Description BLOOD LEFT ANTECUBITAL  Final   Special Requests   Final    BOTTLES DRAWN AEROBIC AND ANAEROBIC Blood Culture results may not be optimal due to an excessive volume of blood received in culture bottles   Culture   Final    NO GROWTH < 24 HOURS Performed at [facility] [address] [place], [place] [address]    Report Status PENDING  Incomplete  Resp Panel by RT-PCR (Flu A&B, Covid)     Status: Abnormal   Collection Time: 02/24/20  8:23 PM  Result Value Ref Range Status   SARS Coronavirus 2  by RT PCR POSITIVE (A) NEGATIVE Final    Comment: RESULT CALLED TO, READ BACK BY AND VERIFIED WITH: ZAC REGISTER @2150  ON 02/24/20 SKL (NOTE) SARS-CoV-2 target nucleic acids are DETECTED.  The SARS-CoV-2 RNA is generally detectable in upper respiratory specimens during the acute phase of infection. Positive results are indicative of the presence of the identified virus, but do not rule out bacterial infection or co-infection with other pathogens not detected by the test. Clinical correlation with patient history and other diagnostic information is necessary to determine patient infection status. The expected result is Negative.  Fact Sheet for Patients: 14/01/21  Fact Sheet for Healthcare Providers: BloggerCourse.com  This test is not yet approved or cleared by the [employer].[place] FDA and  has been authorized for detection and/or diagnosis of SARS-CoV-2 by FDA under an Emergency Use Authorization (EUA).  This EUA will remain in effect (meaning this test can b e used) for the duration of  the COVID-19 declaration under Section 564(b)(1) of the Act,  21 U.S.C. section 360bbb-3(b)(1), unless the authorization is terminated or revoked sooner.     Influenza A by PCR NEGATIVE NEGATIVE Final   Influenza B by PCR NEGATIVE NEGATIVE Final    Comment: (NOTE) The Xpert Xpress SARS-CoV-2/FLU/RSV plus assay is intended as an aid in the diagnosis of influenza from Nasopharyngeal swab specimens and should not be used as a sole basis for treatment. Nasal washings and aspirates are unacceptable for Xpert Xpress SARS-CoV-2/FLU/RSV testing.  Fact Sheet for Patients: [place]  Fact Sheet for Healthcare Providers: BloggerCourse.com  This test is not yet approved or cleared by the [employer]ious[employer] FDA and has been authorized for detection and/or diagnosis of SARS-CoV-2 by FDA under an Emergency Use Authorization (EUA). This EUA will remain in effect (meaning this test can be used) for the duration of the COVID-19 declaration under Section 564(b)(1) of the Act, 21 U.S.C. section 360bbb-3(b)(1), unless the authorization is terminated or revoked.  Performed at [facility], [address], [place], [place]          Radiology Studies: DG Chest [place] 1 View  Result Date: 02/24/2020 CLINICAL DATA:  69 year old male with shortness of breath. EXAM: PORTABLE CHEST 1 VIEW COMPARISON:  Chest radiograph dated 02/19/2020. FINDINGS: Significant interval worsening of bilateral pulmonary infiltrate compared to prior radiograph in keeping with worsening multifocal pneumonia. Clinical correlation and follow-up to resolution recommended. No pleural effusion pneumothorax. Stable cardiac silhouette. Atherosclerotic calcification of the aorta. No acute osseous pathology. IMPRESSION: Worsening multifocal pneumonia. Electronically Signed   By: 02/21/2020 M.D.   On: 02/24/2020 15:34        Scheduled Meds: . aspirin EC  81 mg Oral Daily  . baricitinib  4 mg Oral Daily  . enoxaparin  (LOVENOX) injection  0.5 mg/kg Subcutaneous Q24H  . hydrochlorothiazide  12.5 mg Oral q morning - 10a  . losartan  100 mg Oral Daily  . methylPREDNISolone (SOLU-MEDROL) injection  1 mg/kg Intravenous Q12H   Followed by  . [START ON 02/27/2020] predniSONE  50 mg Oral Daily   Continuous Infusions: . remdesivir 100 mg in NS 100 mL Stopped (02/25/20 1059)     LOS: 1 day    Time spent: 25 minutes    14/02/21, MD [facility] Pager [phone]  If 7PM-7AM, please contact night-coverage 02/25/2020, 1:38 PM

## 2020-02-25 NOTE — Progress Notes (Signed)
PHARMACY NOTE:  ANTIMICROBIAL RENAL DOSAGE ADJUSTMENT  Current antimicrobial regimen includes a mismatch between antimicrobial dosage and estimated renal function.  As per policy approved by the Pharmacy & Therapeutics and Medical Executive Committees, the antimicrobial dosage will be adjusted accordingly.  Current antimicrobial dosage:  baricitinib 2mg   Indication: COVID  Renal Function:  GFR now > 60  Estimated Creatinine Clearance: 88.5 mL/min (by C-G formula based on SCr of 1.09 mg/dL).     Antimicrobial dosage has been changed to:  baricitinib 4mg    Thank you for allowing pharmacy to be a part of this patient's care.  , PharmD, BCPS Clinical Pharmacist 02/25/2020 8:22 AM

## 2020-02-25 NOTE — TOC Initial Note (Signed)
Transition of Care [facility]) - Initial/Assessment Note    Patient Details  Name: Paul Mitchell MRN: [id number] Date of Birth: Sep 24, 1950  [facility]) CM/SW Contact:    Marina Goodell Phone Number: [phone] 02/25/2020, 12:34 PM  Clinical Narrative:                  CSW received call from [facility] RN/Cm with Rosann Auerbach [phone], ext. [phone]. She stated if the patient needed in network home health or discharge planning services the contact number for [employer] is [phone], fax# [phone].  Morrie Sheldon RN/CM asked about patient status and contact information for family.  CSW updated Morrie Sheldon RN/CM on patient status and gave her patient's soupe's contact information.        Patient Goals and CMS Choice        Expected Discharge Plan and Services                                                Prior Living Arrangements/Services                       Activities of Daily Living      Permission Sought/Granted                  Emotional Assessment              Admission diagnosis:  Acute hypoxemic respiratory failure due to COVID-19 (HCC) [U07.1, J96.01] Patient Active Problem List   Diagnosis Date Noted  . Acute hypoxemic respiratory failure due to COVID-19 (HCC) 02/24/2020  . Essential hypertension 02/24/2020   PCP:  Patient, No Pcp Per Pharmacy:   [facility] [address]), [place] - [address] [place]) [place] [address] Phone: [phone] Fax: [phone]     Social Determinants of Health (SDOH) Interventions    Readmission Risk Interventions No flowsheet data found.

## 2020-02-25 NOTE — Plan of Care (Signed)

## 2020-02-26 DIAGNOSIS — U071 COVID-19: Secondary | ICD-10-CM | POA: Diagnosis not present

## 2020-02-26 DIAGNOSIS — J9601 Acute respiratory failure with hypoxia: Secondary | ICD-10-CM | POA: Diagnosis not present

## 2020-02-26 LAB — C-REACTIVE PROTEIN: CRP: 3.9 mg/dL — ABNORMAL HIGH (ref <1.0)

## 2020-02-26 MED ORDER — TRAZODONE HCL 50 MG PO TABS
50.0000 mg | ORAL_TABLET | Freq: Every day | ORAL | Status: DC
  Administered 2020-02-26 – 2020-02-28 (×3): 50 mg via ORAL
  Filled 2020-02-26 (×3): qty 1

## 2020-02-26 NOTE — Plan of Care (Signed)

## 2020-02-26 NOTE — Evaluation (Signed)
Occupational Therapy Evaluation Patient Details Name: Paul Mitchell MRN: [id number] DOB: 09-Mar-1951 Today's Date: 02/26/2020    History of Present Illness  Paul Mitchell is a 69 y.o. male with medical history significant for hypertension presents to the ED with chief concerns of shortness of breath and low energy on Monday, 02/22/20 and was tested positive for [facility]   Clinical Impression   Mr Schar was seen for OT evaluation this date. Prior to hospital admission, pt was Independent in mobility and I/ADLs. Pt lives c wife in 1 level home c 4 STE and R rail. Pt presents to acute OT demonstrating impaired ADL performance and functional mobility 2/2 decreased activity tolerance and functional balance defiicts. Pt currently requires SBA + single UE support for ADL t/f - intermittent CGA for mild R lateral LOB. SBA standing tooth brushing - requires single UE support and VCs for PLB/seated rest.   Upon arrival pt in bed c SpO2 86% on 3L [place] - RN notified and ok to increase O2 for mobility. Seated EOB pt remained in mid 80s on 4-6L [place] and on RA trial. Pt unable to close mouth fully for PLB. Pt desats SpO2 77% during standing tooth brushing - resolved c ~46min seated rest. RN in room to assess, left pt on 4L  SpO2 87%. Pt educated in energy conservation strategies including pursed lip breathing, activity pacing, home/routines modifications, and falls prevention. Pt verbalized understanding and would benefit from additional skilled OT services to maximize recall and carryover of learned techniques and facilitate implementation of learned techniques into daily routines. Upon discharge, anticipate no follow up OT needed.     Follow Up Recommendations  No OT follow up;Supervision - Intermittent    Equipment Recommendations  Tub/shower seat    Recommendations for Other Services       Precautions / Restrictions Precautions Precautions: Fall Restrictions Weight Bearing Restrictions: No       Mobility Bed Mobility Overal bed mobility: Modified Independent      General bed mobility comments: bed rails     Transfers Overall transfer level: Needs assistance Equipment used: 1 person hand held assist Transfers: Sit to/from Stand;Lateral/Scoot Transfers Sit to Stand: Supervision     Lateral/Scoot Transfers: Supervision      Balance Overall balance assessment: Needs assistance Sitting-balance support: No upper extremity supported;Feet supported Sitting balance-Leahy Scale: Normal     Standing balance support: Single extremity supported;During functional activity Standing balance-Leahy Scale: Good Standing balance comment: requires single UE support         ADL either performed or assessed with clinical judgement   ADL Overall ADL's : Needs assistance/impaired        General ADL Comments: SBA standing tooth brushing - requires single UE support and VCs for PLB/seated rest. SBA + ingle UE support for ADL t/f - intermittent CGA for mild R lateral LOB - able to self correct                  Pertinent Vitals/Pain Pain Assessment: No/denies pain     Hand Dominance Left   Extremity/Trunk Assessment Upper Extremity Assessment Upper Extremity Assessment: Overall WFL for tasks assessed   Lower Extremity Assessment Lower Extremity Assessment: Overall WFL for tasks assessed       Communication Communication Communication: No difficulties   Cognition Arousal/Alertness: Awake/alert Behavior During Therapy: WFL for tasks assessed/performed Overall Cognitive Status: Within Functional Limits for tasks assessed  General Comments  Upon arrival SpO2 86% on 3L [place] - RN notified and ok to increase for mobility. Mid 80s on RA - 6L [place] no change despite PLB - pt unable to close mouth. Desat 77% during standing tooth brushing - resolved c ~86min seated rest. RN in room to assess, left on 4L [place]    Exercises Exercises: Other exercises Other  Exercises Other Exercises: Pt educated re: OT role, DME recs, d/c recs, falls prevention, ECS Other Exercises: LBD, tooth brushing, sup>sit, sit<>stand, sitting/standing balance/toelrance, PLB,    Shoulder Instructions      Home Living Family/patient expects to be discharged to:: Private residence Living Arrangements: Spouse/significant other Available Help at Discharge: Family;Available 24 hours/day (wife works from home) Type of Home: House Home Access: Stairs to enter [employer] of Steps: 4 Entrance Stairs-Rails: Right Home Layout: One level     Bathroom Shower/Tub: Tub/shower unit;Door   [employer] Toilet: Standard     Home Equipment: None          Prior Functioning/Environment Level of Independence: Independent                 OT Problem List: Decreased strength;Decreased activity tolerance;Impaired balance (sitting and/or standing);Cardiopulmonary status limiting activity      OT Treatment/Interventions: Self-care/ADL training;Therapeutic exercise;Energy conservation;DME and/or AE instruction;Therapeutic activities;Patient/family education;Balance training    OT Goals(Current goals can be found in the care plan section) Acute Rehab OT Goals Patient Stated Goal: To go home OT Goal Formulation: With patient Time For Goal Achievement: 03/11/20 Potential to Achieve Goals: Good ADL Goals Pt Will Perform Grooming: Independently;standing Pt Will Transfer to Toilet: Independently;ambulating;regular height toilet Additional ADL Goal #1: Pt will Independentyl verbalize plan to implement x3 ECS  OT Frequency: Min 1X/week    AM-PAC OT "6 Clicks" Daily Activity     Outcome Measure Help from another person eating meals?: None Help from another person taking care of personal grooming?: A Little Help from another person toileting, which includes using toliet, bedpan, or urinal?: A Little Help from another person bathing (including washing, rinsing, drying)?:  A Little Help from another person to put on and taking off regular upper body clothing?: None Help from another person to put on and taking off regular lower body clothing?: A Little 6 Click Score: 20   End of Session Equipment Utilized During Treatment: Oxygen (4L [place]) Nurse Communication: Mobility status  Activity Tolerance: Patient tolerated treatment well Patient left: in bed;with call bell/phone within reach  OT Visit Diagnosis: Other abnormalities of gait and mobility (R26.89)                Time: 4010-2725 OT Time Calculation (min): 33 min Charges:  OT General Charges $OT Visit: 1 Visit OT Evaluation $OT Eval Moderate Complexity: 1 Mod OT Treatments $Self Care/Home Management : 8-22 mins $Therapeutic Activity: 8-22 mins  Kathie Dike, M.S. OTR/L  02/26/20, 3:29 PM  ascom [phone]

## 2020-02-26 NOTE — Progress Notes (Signed)
PROGRESS NOTE    Paul PaxKenneth E Mitchell  ZOX:[id number] DOB: 21-Oct-1950 DOA: 02/24/2020 PCP: Patient, No Pcp Per   Brief Narrative:   69 y.o. male with medical history significant for hypertension presents to the ED with chief concerns of shortness of breath and low energy on Monday, 02/22/20 and [facility]   He reports he was tested for Covid on 02/22/2020 and results came back today showing that he tested positive for Covid.  He felt worsening shortness of breath and therefore prompted him to present to the ED for further evaluation.  12/2: Patient has been on heated high flow nasal cannula at 60 L overnight.  Saturations low to mid 90s on this rate.  Patient reports improvement in respiratory symptoms since initiation of antivirals, steroids, supplemental oxygen  12/3: Substantial improvement in respiratory status overnight.  Patient was weaned from heated high flow nasal cannula to 3 L.  Respiratory status stabilized.  Patient denies overt shortness of breath or cough.   Assessment & Plan:   Principal Problem:   Acute hypoxemic respiratory failure due to COVID-19 [facility](HCC) Active Problems:   Essential hypertension  Multifocal pneumonia secondary to COVID-19 Acute hypoxic respiratory failure secondary to above Patient has required heated high flow nasal cannula since admission Normal work of breathing, intact mentation Chest x-ray findings consistent with multifocal infiltrate Procalcitonin negative, do not suspect bacterial coinfection Respiratory status improving Plan: Continue remdesivir day 3/5 Continue Solu-Medrol, day 3/10 Continue baricitinib, day 2/14 Wean oxygen as tolerated Stress incentive spirometry and flutter valve use Prone as tolerated Diet laboratory markers No diuretic today.  Reassess daily for diuretic need and tolerance  Essential hypertension Continue home hydrochlorothiazide 12.5 mg daily Normal start 100 mg  daily  Tachycardia Sinus rhythm on telemetry Suspect secondary to hypoxia and acute illness Treat acute issues as above  DVT prophylaxis: Lovenox Code Status: Full code Family Communication:  Disposition Plan: Status is: Inpatient  Remains inpatient appropriate because:Inpatient level of care appropriate due to severity of illness   Dispo: The patient is from: Home              Anticipated d/c is to: Home              Anticipated d/c date is: 1 day              Patient currently is not medically stable to d/c.   Substantial improvement over interval and respiratory status.  Will attempt to wean entirely from supplemental oxygen over the next 24 hours.  Possible discharge 02/27/2020.   Consultants:   None  Procedures:   None  Antimicrobials:   Remdesivir   Subjective: Patient seen and examined.  Improvement in respiratory status.  Pain in left hip improved.  Objective: Vitals:   02/25/20 2012 02/25/20 2345 02/26/20 0359 02/26/20 0812  BP:  109/64 104/60 113/67  Pulse:  78 76 79  Resp:  20 16 16   Temp:  97.7 F (36.5 C) 97.9 F (36.6 C) (!) 96.9 F (36.1 C)  TempSrc:    Axillary  SpO2: 91% 90% 91% 90%  Weight:      Height:        Intake/Output Summary (Last 24 hours) at 02/26/2020 0959 Last data filed at 02/26/2020 0800 Gross per 24 hour  Intake 99.93 ml  Output 950 ml  Net -850.07 ml   Filed Weights   02/24/20 1506  Weight: 111.1 kg    Examination:  General exam: Appears calm and  comfortable  Respiratory system: Fine crackles bilaterally.  Normal work of breathing.  3 L Cardiovascular system: S1 & S2 heard, RRR. No JVD, murmurs, rubs, gallops or clicks. No pedal edema. Gastrointestinal system: Abdomen is nondistended, soft and nontender. No organomegaly or masses felt. Normal bowel sounds heard. Central nervous system: Alert and oriented. No focal neurological deficits. Extremities: Symmetric 5 x 5 power. Skin: No rashes, lesions or  ulcers Psychiatry: Judgement and insight appear normal. Mood & affect appropriate.     Data Reviewed: I have personally reviewed following labs and imaging studies  CBC: Recent Labs  Lab 02/19/20 1253 02/24/20 1528  WBC 5.1 6.7  HGB 14.2 13.9  HCT 41.5 40.6  MCV 89.4 87.9  PLT 126* 196   Basic Metabolic Panel: Recent Labs  Lab 02/19/20 1253 02/24/20 1528  NA 132* 135  K 3.6 3.5  CL 95* 99  CO2 26 26  GLUCOSE 103* 124*  BUN 30* 22  CREATININE 1.65* 1.09  CALCIUM 8.4* 7.8*   GFR: Estimated Creatinine Clearance: 88.5 mL/min (by C-G formula based on SCr of 1.09 mg/dL). Liver Function Tests: No results for input(s): AST, ALT, ALKPHOS, BILITOT, PROT, ALBUMIN in the last 168 hours. No results for input(s): LIPASE, AMYLASE in the last 168 hours. No results for input(s): AMMONIA in the last 168 hours. Coagulation Profile: No results for input(s): INR, PROTIME in the last 168 hours. Cardiac Enzymes: No results for input(s): CKTOTAL, CKMB, CKMBINDEX, TROPONINI in the last 168 hours. BNP (last 3 results) No results for input(s): PROBNP in the last 8760 hours. HbA1C: No results for input(s): HGBA1C in the last 72 hours. CBG: No results for input(s): GLUCAP in the last 168 hours. Lipid Profile: No results for input(s): CHOL, HDL, LDLCALC, TRIG, CHOLHDL, LDLDIRECT in the last 72 hours. Thyroid Function Tests: No results for input(s): TSH, T4TOTAL, FREET4, T3FREE, THYROIDAB in the last 72 hours. Anemia Panel: No results for input(s): VITAMINB12, FOLATE, FERRITIN, TIBC, IRON, RETICCTPCT in the last 72 hours. Sepsis Labs: Recent Labs  Lab 02/24/20 1528 02/24/20 1544  PROCALCITON 0.10  --   LATICACIDVEN  --  1.7    Recent Results (from the past 240 hour(s))  Culture, blood (Routine X 2) w Reflex to ID Panel     Status: None (Preliminary result)   Collection Time: 02/24/20  3:44 PM   Specimen: BLOOD  Result Value Ref Range Status   Specimen Description BLOOD RIGHT  ANTECUBITAL  Final   Special Requests   Final    BOTTLES DRAWN AEROBIC AND ANAEROBIC Blood Culture adequate volume   Culture   Final    NO GROWTH 2 DAYS Performed at [facility], [address], [place], [place] [address]    Report Status PENDING  Incomplete  Culture, blood (Routine X 2) w Reflex to ID Panel     Status: None (Preliminary result)   Collection Time: 02/24/20  3:44 PM   Specimen: BLOOD  Result Value Ref Range Status   Specimen Description BLOOD LEFT ANTECUBITAL  Final   Special Requests   Final    BOTTLES DRAWN AEROBIC AND ANAEROBIC Blood Culture results may not be optimal due to an excessive volume of blood received in culture bottles   Culture   Final    NO GROWTH 2 DAYS Performed at [facility], [address] [place], [place] [address]    Report Status PENDING  Incomplete  Resp Panel by RT-PCR (Flu A&B, Covid)     Status: Abnormal  Collection Time: 02/24/20  8:23 PM  Result Value Ref Range Status   SARS Coronavirus 2 by RT PCR POSITIVE (A) NEGATIVE Final    Comment: RESULT CALLED TO, READ BACK BY AND VERIFIED WITH: ZAC REGISTER @2150  ON 02/24/20 SKL (NOTE) SARS-CoV-2 target nucleic acids are DETECTED.  The SARS-CoV-2 RNA is generally detectable in upper respiratory specimens during the acute phase of infection. Positive results are indicative of the presence of the identified virus, but do not rule out bacterial infection or co-infection with other pathogens not detected by the test. Clinical correlation with patient history and other diagnostic information is necessary to determine patient infection status. The expected result is Negative.  Fact Sheet for Patients: 14/01/21  Fact Sheet for Healthcare Providers: BloggerCourse.com  This test is not yet approved or cleared by the [employer] FDA and  has been authorized for detection and/or diagnosis of SARS-CoV-2 by FDA  under an Emergency Use Authorization (EUA).  This EUA will remain in effect (meaning this test can b e used) for the duration of  the COVID-19 declaration under Section 564(b)(1) of the Act, 21 U.S.C. section 360bbb-3(b)(1), unless the authorization is terminated or revoked sooner.     Influenza A by PCR NEGATIVE NEGATIVE Final   Influenza B by PCR NEGATIVE NEGATIVE Final    Comment: (NOTE) The Xpert Xpress SARS-CoV-2/FLU/RSV plus assay is intended as an aid in the diagnosis of influenza from Nasopharyngeal swab specimens and should not be used as a sole basis for treatment. Nasal washings and aspirates are unacceptable for Xpert Xpress SARS-CoV-2/FLU/RSV testing.  Fact Sheet for Patients: [place]  Fact Sheet for Healthcare Providers: BloggerCourse.com  This test is not yet approved or cleared by the [employer]ious[employer] FDA and has been authorized for detection and/or diagnosis of SARS-CoV-2 by FDA under an Emergency Use Authorization (EUA). This EUA will remain in effect (meaning this test can be used) for the duration of the COVID-19 declaration under Section 564(b)(1) of the Act, 21 U.S.C. section 360bbb-3(b)(1), unless the authorization is terminated or revoked.  Performed at [facility], [address] [place], [place]          Radiology Studies: DG Chest [place] 1 View  Result Date: 02/24/2020 CLINICAL DATA:  69 year old male with shortness of breath. EXAM: PORTABLE CHEST 1 VIEW COMPARISON:  Chest radiograph dated 02/19/2020. FINDINGS: Significant interval worsening of bilateral pulmonary infiltrate compared to prior radiograph in keeping with worsening multifocal pneumonia. Clinical correlation and follow-up to resolution recommended. No pleural effusion pneumothorax. Stable cardiac silhouette. Atherosclerotic calcification of the aorta. No acute osseous pathology. IMPRESSION: Worsening multifocal  pneumonia. Electronically Signed   By: 02/21/2020 M.D.   On: 02/24/2020 15:34        Scheduled Meds: . aspirin EC  81 mg Oral Daily  . baricitinib  4 mg Oral Daily  . enoxaparin (LOVENOX) injection  0.5 mg/kg Subcutaneous Q24H  . hydrochlorothiazide  12.5 mg Oral q morning - 10a  . losartan  100 mg Oral Daily  . mouth rinse  15 mL Mouth Rinse BID  . methylPREDNISolone (SOLU-MEDROL) injection  1 mg/kg Intravenous Q12H   Followed by  . [START ON 02/27/2020] predniSONE  50 mg Oral Daily  . traZODone  50 mg Oral QHS   Continuous Infusions: . remdesivir 100 mg in NS 100 mL 100 mg (02/26/20 0856)     LOS: 2 days    Time spent: 25 minutes    14/03/21, MD [facility]  Pager [phone]  If 7PM-7AM, please contact night-coverage 02/26/2020, 9:59 AM

## 2020-02-27 DIAGNOSIS — U071 COVID-19: Secondary | ICD-10-CM | POA: Diagnosis not present

## 2020-02-27 DIAGNOSIS — J9601 Acute respiratory failure with hypoxia: Secondary | ICD-10-CM | POA: Diagnosis not present

## 2020-02-27 LAB — BASIC METABOLIC PANEL
Anion gap: 11 (ref 5–15)
BUN: 43 mg/dL — ABNORMAL HIGH (ref 8–23)
CO2: 24 mmol/L (ref 22–32)
Calcium: 7.9 mg/dL — ABNORMAL LOW (ref 8.9–10.3)
Chloride: 103 mmol/L (ref 98–111)
Creatinine, Ser: 1.24 mg/dL (ref 0.61–1.24)
GFR, Estimated: >60 mL/min (ref >60)
Glucose, Bld: 175 mg/dL — ABNORMAL HIGH (ref 70–99)
Potassium: 4 mmol/L (ref 3.5–5.1)
Sodium: 138 mmol/L (ref 135–145)

## 2020-02-27 LAB — CBC WITH DIFFERENTIAL/PLATELET
Abs Immature Granulocytes: 0.07 10*3/uL (ref 0.00–0.07)
Basophils Absolute: 0 10*3/uL (ref 0.0–0.1)
Basophils Relative: 0 %
Eosinophils Absolute: 0 10*3/uL (ref 0.0–0.5)
Eosinophils Relative: 0 %
HCT: 36.6 % — ABNORMAL LOW (ref 39.0–52.0)
Hemoglobin: 12.4 g/dL — ABNORMAL LOW (ref 13.0–17.0)
Immature Granulocytes: 1 %
Lymphocytes Relative: 15 %
Lymphs Abs: 1.3 10*3/uL (ref 0.7–4.0)
MCH: 30.1 pg (ref 26.0–34.0)
MCHC: 33.9 g/dL (ref 30.0–36.0)
MCV: 88.8 fL (ref 80.0–100.0)
Monocytes Absolute: 0.6 10*3/uL (ref 0.1–1.0)
Monocytes Relative: 7 %
Neutro Abs: 6.7 10*3/uL (ref 1.7–7.7)
Neutrophils Relative %: 77 %
Platelets: 258 10*3/uL (ref 150–400)
RBC: 4.12 MIL/uL — ABNORMAL LOW (ref 4.22–5.81)
RDW: 13.2 % (ref 11.5–15.5)
WBC: 8.7 10*3/uL (ref 4.0–10.5)
nRBC: 0 % (ref 0.0–0.2)

## 2020-02-27 LAB — C-REACTIVE PROTEIN: CRP: 1.3 mg/dL — ABNORMAL HIGH (ref <1.0)

## 2020-02-27 MED ORDER — MELATONIN 5 MG PO TABS
2.5000 mg | ORAL_TABLET | Freq: Every evening | ORAL | Status: DC | PRN
  Filled 2020-02-27: qty 0.5

## 2020-02-27 NOTE — Plan of Care (Signed)

## 2020-02-27 NOTE — Progress Notes (Signed)
Paul Mitchell  JAS:[id number] DOB: 03-10-1951 DOA: 02/24/2020 PCP: Patient, No Pcp Per   Brief Narrative:   69 y.o. male with medical history significant for hypertension presents to the ED with chief concerns of shortness of breath and low energy on Monday, 02/22/20 and was tested positive for [facility].   He reports he was tested for Covid on 02/22/2020 and results came back today showing that he tested positive for Covid.  He felt worsening shortness of breath and therefore prompted him to present to the ED for further evaluation.  12/2: Patient has been on heated high flow nasal cannula at 60 L overnight.  Saturations low to mid 90s on this rate.  Patient reports improvement in respiratory symptoms since initiation of antivirals, steroids, supplemental oxygen  12/3: Substantial improvement in respiratory status overnight.  Patient was weaned from heated high flow nasal cannula to 3 L.  Respiratory status stabilized.  Patient denies overt shortness of breath or cough.    Assessment & Plan:   Principal Problem:   Acute hypoxemic respiratory failure due to COVID-19 [facility]) Active Problems:   Essential hypertension  Multifocal pneumonia secondary to COVID-19 Acute hypoxic respiratory failure secondary to above Patient has required heated high flow nasal cannula since admission Normal work of breathing, intact mentation Chest x-ray findings consistent with multifocal infiltrate Procalcitonin negative, do not suspect bacterial coinfection Oxygen requirement and respiratory status slowly improving Plan: Continue remdesivir, day 4/5 Solu-Medrol, day 4/10 Baricitinib, day 3/14 Continue oxygen, wean as tolerated Stress incentive spirometry and flutter valve use Prone as tolerated Daily inflammatory markers No Lasix today given mild increase in creatinine  Essential hypertension Continue home hydrochlorothiazide 12.5 mg daily Start 100 mg  daily  Tachycardia Sinus rhythm on telemetry Suspect secondary to hypoxia and acute illness Treat acute issues as above  DVT prophylaxis: Lovenox Code Status: Full code Family Communication: None today Disposition Plan: Status is: Inpatient  Remains inpatient appropriate because:Inpatient level of care appropriate due to severity of illness   Dispo: The patient is from: Home              Anticipated d/c is to: Home              Anticipated d/c date is: 1 day              Patient currently is not medically stable to d/c.   Remains hypoxic currently requiring between three and 5 L submental oxygen. Will attempt to wean entirely from supplemental oxygen prior to discharge. Anticipate discharge home on 02/28/2020. If patient on 2 L or less will ambulate and possibly recommend home oxygen.    Consultants:   None  Procedures:   None  Antimicrobials:   Remdesivir   Subjective: Patient seen and examined. Improvement in respiratory status. Request to go home. No pain complaints this morning.  Objective: Vitals:   02/27/20 0040 02/27/20 0300 02/27/20 0502 02/27/20 0817  BP: 117/65  108/62 (!) 102/57  Pulse: 67  71 73  Resp: 18  20 16   Temp: 97.6 F (36.4 C)  97.8 F (36.6 C) 97.6 F (36.4 C)  TempSrc: Oral  Oral   SpO2: 94% 94% 94% 92%  Weight:      Height:       No intake or output data in the 24 hours ending 02/27/20 1053 Filed Weights   02/24/20 1506  Weight: 111.1 kg    Examination:  General exam: Appears calm and comfortable  Respiratory system: Bibasilar crackles. Normal work of breathing. 3 L Cardiovascular system: S1 & S2 heard, RRR. No JVD, murmurs, rubs, gallops or clicks. No pedal edema. Gastrointestinal system: Abdomen is nondistended, soft and nontender. No organomegaly or masses felt. Normal bowel sounds heard. Central nervous system: Alert and oriented. No focal neurological deficits. Extremities: Symmetric 5 x 5 power. Skin: No rashes, lesions  or ulcers Psychiatry: Judgement and insight appear normal. Mood & affect appropriate.     Data Reviewed: I have personally reviewed following labs and imaging studies  CBC: Recent Labs  Lab 02/24/20 1528 02/27/20 0424  WBC 6.7 8.7  NEUTROABS  --  6.7  HGB 13.9 12.4*  HCT 40.6 36.6*  MCV 87.9 88.8  PLT 196 258   Basic Metabolic Panel: Recent Labs  Lab 02/24/20 1528 02/27/20 0424  NA 135 138  K 3.5 4.0  CL 99 103  CO2 26 24  GLUCOSE 124* 175*  BUN 22 43*  CREATININE 1.09 1.24  CALCIUM 7.8* 7.9*   GFR: Estimated Creatinine Clearance: 77.8 mL/min (by C-G formula based on SCr of 1.24 mg/dL). Liver Function Tests: No results for input(s): AST, ALT, ALKPHOS, BILITOT, PROT, ALBUMIN in the last 168 hours. No results for input(s): LIPASE, AMYLASE in the last 168 hours. No results for input(s): AMMONIA in the last 168 hours. Coagulation Profile: No results for input(s): INR, PROTIME in the last 168 hours. Cardiac Enzymes: No results for input(s): CKTOTAL, CKMB, CKMBINDEX, TROPONINI in the last 168 hours. BNP (last 3 results) No results for input(s): PROBNP in the last 8760 hours. HbA1C: No results for input(s): HGBA1C in the last 72 hours. CBG: No results for input(s): GLUCAP in the last 168 hours. Lipid Profile: No results for input(s): CHOL, HDL, LDLCALC, TRIG, CHOLHDL, LDLDIRECT in the last 72 hours. Thyroid Function Tests: No results for input(s): TSH, T4TOTAL, FREET4, T3FREE, THYROIDAB in the last 72 hours. Anemia Panel: No results for input(s): VITAMINB12, FOLATE, FERRITIN, TIBC, IRON, RETICCTPCT in the last 72 hours. Sepsis Labs: Recent Labs  Lab 02/24/20 1528 02/24/20 1544  PROCALCITON 0.10  --   LATICACIDVEN  --  1.7    Recent Results (from the past 240 hour(s))  Culture, blood (Routine X 2) w Reflex to ID Panel     Status: None (Preliminary result)   Collection Time: 02/24/20  3:44 PM   Specimen: BLOOD  Result Value Ref Range Status   Specimen  Description BLOOD RIGHT ANTECUBITAL  Final   Special Requests   Final    BOTTLES DRAWN AEROBIC AND ANAEROBIC Blood Culture adequate volume   Culture   Final    NO GROWTH 3 DAYS Performed at [facility], [address], [place], [place] [address]    Report Status PENDING  Incomplete  Culture, blood (Routine X 2) w Reflex to ID Panel     Status: None (Preliminary result)   Collection Time: 02/24/20  3:44 PM   Specimen: BLOOD  Result Value Ref Range Status   Specimen Description BLOOD LEFT ANTECUBITAL  Final   Special Requests   Final    BOTTLES DRAWN AEROBIC AND ANAEROBIC Blood Culture results may not be optimal due to an excessive volume of blood received in culture bottles   Culture   Final    NO GROWTH 3 DAYS Performed at [facility], [address] [place], [place] [address]    Report Status PENDING  Incomplete  Resp Panel by RT-PCR (Flu A&B, Covid)     Status:  Abnormal   Collection Time: 02/24/20  8:23 PM  Result Value Ref Range Status   SARS Coronavirus 2 by RT PCR POSITIVE (A) NEGATIVE Final    Comment: RESULT CALLED TO, READ BACK BY AND VERIFIED WITH: ZAC REGISTER @2150  ON 02/24/20 SK[facility] (NOTE) SARS-CoV-2 target nucleic acids are DETECTED.  The SARS-CoV-2 RNA is generally detectable in upper respiratory specimens during the acute phase of infection. Positive results are indicative of the presence of the identified virus, but do not rule out bacterial infection or co-infection with other pathogens not detected by the test. Clinical correlation with patient history and other diagnostic information is necessary to determine patient infection status. The expected result is Negative.  Fact Sheet for Patients: 14/01/21  Fact Sheet for Healthcare Providers: BloggerCourse.com  This test is not yet approved or cleared by the [employer]ious[employer] FDA and  has been authorized for detection and/or diagnosis  of SARS-CoV-2 by FDA under an Emergency Use Authorization (EUA).  This EUA will remain in effect (meaning this test can b e used) for the duration of  the COVID-19 declaration under Section 564(b)(1) of the Act, 21 U.S.C. section 360bbb-3(b)(1), unless the authorization is terminated or revoked sooner.     Influenza A by PCR NEGATIVE NEGATIVE Final   Influenza B by PCR NEGATIVE NEGATIVE Final    Comment: (NOTE) The Xpert Xpress SARS-CoV-2/FLU/RSV plus assay is intended as an aid in the diagnosis of influenza from Nasopharyngeal swab specimens and should not be used as a sole basis for treatment. Nasal washings and aspirates are unacceptable for Xpert Xpress SARS-CoV-2/FLU/RSV testing.  Fact Sheet for Patients: [place]  Fact Sheet for Healthcare Providers: BloggerCourse.com  This test is not yet approved or cleared by the [employer]ious[employer]ker.it FDA and has been authorized for detection and/or diagnosis of SARS-CoV-2 by FDA under an Emergency Use Authorization (EUA). This EUA will remain in effect (meaning this test can be used) for the duration of the COVID-19 declaration under Section 564(b)(1) of the Act, 21 U.S.C. section 360bbb-3(b)(1), unless the authorization is terminated or revoked.  Performed at [facility], [address], [place], [place]          Radiology Studies: No results found.      Scheduled Meds: . aspirin EC  81 mg Oral Daily  . baricitinib  4 mg Oral Daily  . enoxaparin (LOVENOX) injection  0.5 mg/kg Subcutaneous Q24H  . hydrochlorothiazide  12.5 mg Oral q morning - 10a  . losartan  100 mg Oral Daily  . mouth rinse  15 mL Mouth Rinse BID  . predniSONE  50 mg Oral Daily  . traZODone  50 mg Oral QHS   Continuous Infusions: . remdesivir 100 mg in NS 100 mL 100 mg (02/27/20 1004)     LOS: 3 days    Time spent: 25 minutes    14/04/21, MD [facility] Pager [phone]  If 7PM-7AM, please contact night-coverage 02/27/2020, 10:53 AM

## 2020-02-27 NOTE — Evaluation (Signed)
Physical Therapy Evaluation Patient Details Name: Paul Mitchell MRN: [id number] DOB: January 29, 1951 Today's Date: 02/27/2020   History of Present Illness  Paul Mitchell is a 69 y.o. male with medical history significant for hypertension presents to the ED with chief concerns of shortness of breath and low energy on Monday, 02/22/20 and was tested positive for COVID19    Clinical Impression  Patient reclining on bed at start of visit. Alert and willing to participate in PT. Most of history obtained from recent OT evaluation and reviewed with patient. Patient lives in a single story home with his wife. He works full time as a Radiographer, therapeutic. Prior to hospitalization he was fully independent in all aspects of care and mobility without an assistive device. Upon physical therapy evaluation, patient was independent with bed mobility and sit <> stand transfers. He ambulated 2x60 feet in the room with supervision and no AD. Did have one loss of balance which he self-corrected with stepping strategy. States he has not been up and walking due to being told not to without someone there. No stairs available in the room but patient was able to complete 5 Time Sit To Stand Test in 18 seconds from edge of bed with B UE support pushing off and was able to complete two sets of high knee marching, x7 each side and x10 each side which partially simulates the strength, balance, and endurance required for 4 steps. Patient was limited by SpO2 dropping and activity was discontinued for seated rest at ~80% but went as low as 75% at one time. Patient remained asymptomatic throughout. Practiced pursed lip breathing and incentive spirometer which patient was instructed in how to use and recommended to use 10 times every hour with rests between breaths. Patient does appear to have experienced a decrease in functional mobility tolerance and would benefit from continued PT to help him return to PLOF. Patient would  benefit from skilled physical therapy to address impairments and functional limitations (see PT Problem List) to work towards stated goals and return to PLOF or maximal functional independence.       Follow Up Recommendations Home health PT    Equipment Recommendations  3in1 (PT) (for use for energy conservation while showering)    Recommendations for Other Services       Precautions / Restrictions Precautions Precautions: Fall Restrictions Weight Bearing Restrictions: No      Mobility  Bed Mobility Overal bed mobility: Independent             General bed mobility comments: head of bed flat    Transfers Overall transfer level: Needs assistance Equipment used: None Transfers: Sit to/from Stand Sit to Stand: Independent         General transfer comment: sit <> stand from edge of bed with no AD. 5 Time Sit To Stand at edge of bed with BUE support to push off: 18 seconds.(SpO2 stayed at high 80s).  Ambulation/Gait Ambulation/Gait assistance: Supervision Gait Distance (Feet): 60 Feet (2x60 feet) Assistive device: None Gait Pattern/deviations: WFL(Within Functional Limits) Gait velocity: moderately slow (not a lot of space to go back and forth in room).   General Gait Details: Patient with one stagger but recovered on his own. SpO2 dropped to 76% from 89% so discontinued to allow PLB in seated position until he recoverd. Praticed standing marching with no AD: able to get to x7 each side but lost balance when lifting R leg (able to self correct with hip strategy once  and had to grab bed 2nd time, SpO2 dropped to 75% after stopping at 80%, and recovered with PLB and rest).  Stairs Stairs:  (No stairs available)          Wheelchair Mobility    Modified Rankin (Stroke Patients Only)       Balance     Sitting balance-Leahy Scale: Normal       Standing balance-Leahy Scale: Good Standing balance comment: able to stand and walk with one small LOB that  patient was able to recover from with step strategy. Did lose balance with standing marching high knees when standing on L LE and required hip strategy or grasping bed to recover. able to do 10 high knee marches with no UE support 2nd time through (spo2 stayed up to 89%)                             Pertinent Vitals/Pain Pain Assessment: No/denies pain    Home Living Family/patient expects to be discharged to:: Private residence Living Arrangements: Spouse/significant other Available Help at Discharge: Family;Available 24 hours/day (wife works from home) Type of Home: House Home Access: Stairs to enter Entrance Stairs-Rails: Right Entrance Stairs-Number of Steps: 4 Home Layout: One level Home Equipment: None      Prior Function Level of Independence: Independent         Comments: Independent in all aspects of care. Works full time driving a truck.     Hand Dominance   Dominant Hand: Left    Extremity/Trunk Assessment   Upper Extremity Assessment Upper Extremity Assessment: Overall WFL for tasks assessed    Lower Extremity Assessment Lower Extremity Assessment: Overall WFL for tasks assessed    Cervical / Trunk Assessment Cervical / Trunk Assessment: Normal  Communication   Communication: No difficulties  Cognition Arousal/Alertness: Awake/alert Behavior During Therapy: WFL for tasks assessed/performed Overall Cognitive Status: Within Functional Limits for tasks assessed                                        General Comments General comments (skin integrity, edema, etc.): 4.5L/min throughout session as found when PT arrived. SpO2 dropped as low 75% per finger pulse ox (no symptoms). recovered with seated PLB.    Exercises Other Exercises Other Exercises: educated pt on role of PT in acute care setting, discharge reccomendations. Other Exercises: Practiced high marching 2x10 each side, 5 times sit to stand with BUE on bed.    Assessment/Plan    PT Assessment Patient needs continued PT services  PT Problem List Decreased strength;Decreased activity tolerance;Decreased balance;Decreased mobility;Cardiopulmonary status limiting activity       PT Treatment Interventions Therapeutic exercise;Balance training;Functional mobility training;Stair training;Gait training;Therapeutic activities;Patient/family education;Neuromuscular re-education    PT Goals (Current goals can be found in the Care Plan section)  Acute Rehab PT Goals Patient Stated Goal: to go home PT Goal Formulation: With patient Time For Goal Achievement: 03/12/20 Potential to Achieve Goals: Good    Frequency Min 2X/week   Barriers to discharge        Co-evaluation               AM-PAC PT "6 Clicks" Mobility  Outcome Measure Help needed turning from your back to your side while in a flat bed without using bedrails?: None Help needed moving from lying on your back to sitting on  the side of a flat bed without using bedrails?: None Help needed moving to and from a bed to a chair (including a wheelchair)?: None Help needed standing up from a chair using your arms (e.g., wheelchair or bedside chair)?: None Help needed to walk in hospital room?: A Little Help needed climbing 3-5 steps with a railing? : A Little 6 Click Score: 22    End of Session Equipment Utilized During Treatment: Oxygen (4.5L/min) Activity Tolerance: Patient tolerated treatment well;Other (comment) (drop in SpO2) Patient left: in bed;with call bell/phone within reach Nurse Communication: Mobility status PT Visit Diagnosis: Difficulty in walking, not elsewhere classified (R26.2);Unsteadiness on feet (R26.81)    Time: 1716-1730 PT Time Calculation (min) (ACUTE ONLY): 14 min   Charges:   PT Evaluation $PT Eval Moderate Complexity: 1 Mod PT Treatments $Therapeutic Activity: 8-22 mins        Luretha Murphy. Ilsa Iha, PT, DPT 02/27/20, 6:12 PM

## 2020-02-28 DIAGNOSIS — J9601 Acute respiratory failure with hypoxia: Secondary | ICD-10-CM | POA: Diagnosis not present

## 2020-02-28 DIAGNOSIS — U071 COVID-19: Secondary | ICD-10-CM | POA: Diagnosis not present

## 2020-02-28 LAB — CBC WITH DIFFERENTIAL/PLATELET
Abs Immature Granulocytes: 0.09 10*3/uL — ABNORMAL HIGH (ref 0.00–0.07)
Basophils Absolute: 0 10*3/uL (ref 0.0–0.1)
Basophils Relative: 0 %
Eosinophils Absolute: 0 10*3/uL (ref 0.0–0.5)
Eosinophils Relative: 0 %
HCT: 34.4 % — ABNORMAL LOW (ref 39.0–52.0)
Hemoglobin: 11.9 g/dL — ABNORMAL LOW (ref 13.0–17.0)
Immature Granulocytes: 1 %
Lymphocytes Relative: 18 %
Lymphs Abs: 1.2 10*3/uL (ref 0.7–4.0)
MCH: 30.7 pg (ref 26.0–34.0)
MCHC: 34.6 g/dL (ref 30.0–36.0)
MCV: 88.9 fL (ref 80.0–100.0)
Monocytes Absolute: 0.6 10*3/uL (ref 0.1–1.0)
Monocytes Relative: 9 %
Neutro Abs: 4.7 10*3/uL (ref 1.7–7.7)
Neutrophils Relative %: 72 %
Platelets: 233 10*3/uL (ref 150–400)
RBC: 3.87 MIL/uL — ABNORMAL LOW (ref 4.22–5.81)
RDW: 13.2 % (ref 11.5–15.5)
WBC: 6.5 10*3/uL (ref 4.0–10.5)
nRBC: 0 % (ref 0.0–0.2)

## 2020-02-28 LAB — BASIC METABOLIC PANEL
Anion gap: 8 (ref 5–15)
BUN: 38 mg/dL — ABNORMAL HIGH (ref 8–23)
CO2: 25 mmol/L (ref 22–32)
Calcium: 7.9 mg/dL — ABNORMAL LOW (ref 8.9–10.3)
Chloride: 103 mmol/L (ref 98–111)
Creatinine, Ser: 1.12 mg/dL (ref 0.61–1.24)
GFR, Estimated: >60 mL/min (ref >60)
Glucose, Bld: 165 mg/dL — ABNORMAL HIGH (ref 70–99)
Potassium: 4.6 mmol/L (ref 3.5–5.1)
Sodium: 136 mmol/L (ref 135–145)

## 2020-02-28 LAB — C-REACTIVE PROTEIN: CRP: 0.7 mg/dL (ref <1.0)

## 2020-02-28 LAB — CULTURE, BLOOD (ROUTINE X 2): Culture: NO GROWTH

## 2020-02-28 MED ORDER — FUROSEMIDE 10 MG/ML IJ SOLN
40.0000 mg | Freq: Once | INTRAMUSCULAR | Status: DC
  Filled 2020-02-28: qty 4

## 2020-02-28 NOTE — Progress Notes (Signed)
Pt's O2 was 88% at rest. Increased O2 to 4 LPM. O2 back up to 92%. Will continue to monitor.

## 2020-02-28 NOTE — Progress Notes (Signed)
SATURATION QUALIFICATIONS: (This note is used to comply with regulatory documentation for home oxygen)  Patient Saturations on Room Air at Rest = 90%  Patient Saturations on Room Air while Ambulating = 86%  Patient Saturations on 2 Liters of oxygen while Ambulating = 87%  Please briefly explain why patient needs home oxygen:  Patient O2 sats 86%- 87% on 2L while ambulating.  Once back to bed placed on 3L O2.  O2 sats up to 92% on 3L.

## 2020-02-28 NOTE — TOC Progression Note (Signed)
Transition of Care [facility]) - Progression Note    Patient Details  Name: Paul Mitchell MRN: [id number] Date of Birth: 10/03/1950  Transition of Care [facility]) CM/SW Contact  [place], Johnnette Litter, [place] Phone Number: 02/28/2020, 9:33 AM  Clinical Narrative:  [facility] team spoke with patient. He is alert and shares he is ready to go home. He continues on O2 presently. Call to [facility]: [phone] to order 3 in 1 delivery to the bedside per PT recommendations. Explained the DME and its ust to the patient. Will continue to monitor for discharge planning.         Expected Discharge Plan and Services                                                 Social Determinants of Health (SDOH) Interventions    Readmission Risk Interventions No flowsheet data found.

## 2020-02-28 NOTE — Progress Notes (Signed)
PROGRESS NOTE    Paul Mitchell  HKV:[id number] DOB: May 11, 1950 DOA: 02/24/2020 PCP: Patient, No Pcp Per   Brief Narrative:   69 y.o. male with medical history significant for hypertension presents to the ED with chief concerns of shortness of breath and low energy on Monday, 02/22/20 and was tested positive for [facility].   He reports he was tested for Covid on 02/22/2020 and results came back today showing that he tested positive for Covid.  He felt worsening shortness of breath and therefore prompted him to present to the ED for further evaluation.  12/2: Patient has been on heated high flow nasal cannula at 60 L overnight.  Saturations low to mid 90s on this rate.  Patient reports improvement in respiratory symptoms since initiation of antivirals, steroids, supplemental oxygen  12/3: Substantial improvement in respiratory status overnight.  Patient was weaned from heated high flow nasal cannula to 3 L.  Respiratory status stabilized.  Patient denies overt shortness of breath or cough.    Assessment & Plan:   Principal Problem:   Acute hypoxemic respiratory failure due to COVID-19 [facility]) Active Problems:   Essential hypertension  Multifocal pneumonia secondary to COVID-19 Acute hypoxic respiratory failure secondary to above Patient has required heated high flow nasal cannula since admission Normal work of breathing, intact mentation Chest x-ray findings consistent with multifocal infiltrate Procalcitonin negative, do not suspect bacterial coinfection Oxygen requirement and respiratory status slowly improving Plan: Continue remdesivir, day 5/5 Solu-Medrol, day 5/10 Baricitinib, day 4/14 Continue oxygen, wean as tolerated Stress incentive spirometry and flutter valve use Prone as tolerated Daily inflammatory markers Lasix 40 mg IV x1 today  Essential hypertension Continue home hydrochlorothiazide 12.5 mg daily Losartan 100 mg daily  Tachycardia Sinus rhythm on  telemetry Suspect secondary to hypoxia and acute illness Treat acute issues as above  DVT prophylaxis: Lovenox Code Status: Full code Family Communication: None today Disposition Plan: Status is: Inpatient  Remains inpatient appropriate because:Inpatient level of care appropriate due to severity of illness   Dispo: The patient is from: Home              Anticipated d/c is to: Home              Anticipated d/c date is: 1 day              Patient currently is not medically stable to d/c.   Remains on 4 L nasal cannula.  Unable to wean further at this time.  Will keep stressing incentive spirometry use, ambulate as tolerated.  Lasix 40 mg IV x1 today.  Will attempt to wean to 2 L of oxygen or less.  If successful will discharge home on 02/29/2020   Consultants:   None  Procedures:   None  Antimicrobials:   Remdesivir   Subjective: Patient seen and examined.  Stable respiratory status over interval.  Again interested in going home.  Objective: Vitals:   02/28/20 0017 02/28/20 0512 02/28/20 0817 02/28/20 1221  BP: 104/66 102/65 99/69 114/81  Pulse: 66 65 74 66  Resp: 18 18 20 18   Temp: 98.5 F (36.9 C) 97.6 F (36.4 C) 97.6 F (36.4 C) 97.6 F (36.4 C)  TempSrc: Oral Oral Oral   SpO2: 93% 94% 92% 95%  Weight:      Height:        Intake/Output Summary (Last 24 hours) at 02/28/2020 1335 Last data filed at 02/28/2020 1023 Gross per 24 hour  Intake 306.76 ml  Output --  Net 306.76 ml   Filed Weights   02/24/20 1506  Weight: 111.1 kg    Examination:  General exam: Appears calm and comfortable  Respiratory system: Bibasilar crackles.  Normal work of breathing.  3 L Cardiovascular system: S1 & S2 heard, RRR. No JVD, murmurs, rubs, gallops or clicks. No pedal edema. Gastrointestinal system: Abdomen is nondistended, soft and nontender. No organomegaly or masses felt. Normal bowel sounds heard. Central nervous system: Alert and oriented. No focal neurological  deficits. Extremities: Symmetric 5 x 5 power. Skin: No rashes, lesions or ulcers Psychiatry: Judgement and insight appear normal. Mood & affect appropriate.     Data Reviewed: I have personally reviewed following labs and imaging studies  CBC: Recent Labs  Lab 02/24/20 1528 02/27/20 0424 02/28/20 0415  WBC 6.7 8.7 6.5  NEUTROABS  --  6.7 4.7  HGB 13.9 12.4* 11.9*  HCT 40.6 36.6* 34.4*  MCV 87.9 88.8 88.9  PLT 196 258 233   Basic Metabolic Panel: Recent Labs  Lab 02/24/20 1528 02/27/20 0424 02/28/20 0415  NA 135 138 136  K 3.5 4.0 4.6  CL 99 103 103  CO2 26 24 25   GLUCOSE 124* 175* 165*  BUN 22 43* 38*  CREATININE 1.09 1.24 1.12  CALCIUM 7.8* 7.9* 7.9*   GFR: Estimated Creatinine Clearance: 86.1 mL/min (by C-G formula based on SCr of 1.12 mg/dL). Liver Function Tests: No results for input(s): AST, ALT, ALKPHOS, BILITOT, PROT, ALBUMIN in the last 168 hours. No results for input(s): LIPASE, AMYLASE in the last 168 hours. No results for input(s): AMMONIA in the last 168 hours. Coagulation Profile: No results for input(s): INR, PROTIME in the last 168 hours. Cardiac Enzymes: No results for input(s): CKTOTAL, CKMB, CKMBINDEX, TROPONINI in the last 168 hours. BNP (last 3 results) No results for input(s): PROBNP in the last 8760 hours. HbA1C: No results for input(s): HGBA1C in the last 72 hours. CBG: No results for input(s): GLUCAP in the last 168 hours. Lipid Profile: No results for input(s): CHOL, HDL, LDLCALC, TRIG, CHOLHDL, LDLDIRECT in the last 72 hours. Thyroid Function Tests: No results for input(s): TSH, T4TOTAL, FREET4, T3FREE, THYROIDAB in the last 72 hours. Anemia Panel: No results for input(s): VITAMINB12, FOLATE, FERRITIN, TIBC, IRON, RETICCTPCT in the last 72 hours. Sepsis Labs: Recent Labs  Lab 02/24/20 1528 02/24/20 1544  PROCALCITON 0.10  --   LATICACIDVEN  --  1.7    Recent Results (from the past 240 hour(s))  Culture, blood (Routine X 2)  w Reflex to ID Panel     Status: None (Preliminary result)   Collection Time: 02/24/20  3:44 PM   Specimen: BLOOD  Result Value Ref Range Status   Specimen Description BLOOD RIGHT ANTECUBITAL  Final   Special Requests   Final    BOTTLES DRAWN AEROBIC AND ANAEROBIC Blood Culture adequate volume   Culture   Final    NO GROWTH 4 DAYS Performed at [facility], [address], [place], [place]    Report Status PENDING  Incomplete  Culture, blood (Routine X 2) w Reflex to ID Panel     Status: None (Preliminary result)   Collection Time: 02/24/20  3:44 PM   Specimen: BLOOD  Result Value Ref Range Status   Specimen Description BLOOD LEFT ANTECUBITAL  Final   Special Requests   Final    BOTTLES DRAWN AEROBIC AND ANAEROBIC Blood Culture results may not be optimal due to an excessive volume of blood received in culture  bottles   Culture   Final    NO GROWTH 4 DAYS Performed at [facility], [address] [place], [place] [address]    Report Status PENDING  Incomplete  Resp Panel by RT-PCR (Flu A&B, Covid)     Status: Abnormal   Collection Time: 02/24/20  8:23 PM  Result Value Ref Range Status   SARS Coronavirus 2 by RT PCR POSITIVE (A) NEGATIVE Final    Comment: RESULT CALLED TO, READ BACK BY AND VERIFIED WITH: ZAC REGISTER @2150  ON 02/24/20 SKL (NOTE) SARS-CoV-2 target nucleic acids are DETECTED.  The SARS-CoV-2 RNA is generally detectable in upper respiratory specimens during the acute phase of infection. Positive results are indicative of the presence of the identified virus, but do not rule out bacterial infection or co-infection with other pathogens not detected by the test. Clinical correlation with patient history and other diagnostic information is necessary to determine patient infection status. The expected result is Negative.  Fact Sheet for Patients: 14/01/21  Fact Sheet for Healthcare  Providers: BloggerCourse.com  This test is not yet approved or cleared by the [employer]ious[employer].[place] FDA and  has been authorized for detection and/or diagnosis of SARS-CoV-2 by FDA under an Emergency Use Authorization (EUA).  This EUA will remain in effect (meaning this test can b e used) for the duration of  the COVID-19 declaration under Section 564(b)(1) of the Act, 21 U.S.C. section 360bbb-3(b)(1), unless the authorization is terminated or revoked sooner.     Influenza A by PCR NEGATIVE NEGATIVE Final   Influenza B by PCR NEGATIVE NEGATIVE Final    Comment: (NOTE) The Xpert Xpress SARS-CoV-2/FLU/RSV plus assay is intended as an aid in the diagnosis of influenza from Nasopharyngeal swab specimens and should not be used as a sole basis for treatment. Nasal washings and aspirates are unacceptable for Xpert Xpress SARS-CoV-2/FLU/RSV testing.  Fact Sheet for Patients: [place]  Fact Sheet for Healthcare Providers: BloggerCourse.com  This test is not yet approved or cleared by the [employer]ious[employer]ker.it FDA and has been authorized for detection and/or diagnosis of SARS-CoV-2 by FDA under an Emergency Use Authorization (EUA). This EUA will remain in effect (meaning this test can be used) for the duration of the COVID-19 declaration under Section 564(b)(1) of the Act, 21 U.S.C. section 360bbb-3(b)(1), unless the authorization is terminated or revoked.  Performed at [facility], [address], [place], [place]          Radiology Studies: No results found.      Scheduled Meds: . aspirin EC  81 mg Oral Daily  . baricitinib  4 mg Oral Daily  . enoxaparin (LOVENOX) injection  0.5 mg/kg Subcutaneous Q24H  . furosemide  40 mg Intravenous Once  . hydrochlorothiazide  12.5 mg Oral q morning - 10a  . losartan  100 mg Oral Daily  . mouth rinse  15 mL Mouth Rinse BID  . predniSONE  50 mg  Oral Daily  . traZODone  50 mg Oral QHS   Continuous Infusions:    LOS: 4 days    Time spent: 15 minutes    [id number], MD [facility] Pager [phone]  If 7PM-7AM, please contact night-coverage 02/28/2020, 1:35 PM

## 2020-02-28 NOTE — Progress Notes (Signed)
Current BP 104/66.  Lasix IV due.  SBP in am was in the 90's.  BP meds were not given today.  Notified Dr. Georgeann Oppenheim of the above.  Per MD to hold lasix.

## 2020-02-29 DIAGNOSIS — J9601 Acute respiratory failure with hypoxia: Secondary | ICD-10-CM | POA: Diagnosis not present

## 2020-02-29 DIAGNOSIS — U071 COVID-19: Secondary | ICD-10-CM | POA: Diagnosis not present

## 2020-02-29 LAB — CBC WITH DIFFERENTIAL/PLATELET
Abs Immature Granulocytes: 0.07 K/uL (ref 0.00–0.07)
Basophils Absolute: 0 K/uL (ref 0.0–0.1)
Basophils Relative: 0 %
Eosinophils Absolute: 0.1 K/uL (ref 0.0–0.5)
Eosinophils Relative: 1 %
HCT: 35.3 % — ABNORMAL LOW (ref 39.0–52.0)
Hemoglobin: 12 g/dL — ABNORMAL LOW (ref 13.0–17.0)
Immature Granulocytes: 1 %
Lymphocytes Relative: 25 %
Lymphs Abs: 2.2 K/uL (ref 0.7–4.0)
MCH: 30.4 pg (ref 26.0–34.0)
MCHC: 34 g/dL (ref 30.0–36.0)
MCV: 89.4 fL (ref 80.0–100.0)
Monocytes Absolute: 0.9 K/uL (ref 0.1–1.0)
Monocytes Relative: 10 %
Neutro Abs: 5.5 K/uL (ref 1.7–7.7)
Neutrophils Relative %: 63 %
Platelets: 237 K/uL (ref 150–400)
RBC: 3.95 MIL/uL — ABNORMAL LOW (ref 4.22–5.81)
RDW: 13.1 % (ref 11.5–15.5)
WBC: 8.7 K/uL (ref 4.0–10.5)
nRBC: 0 % (ref 0.0–0.2)

## 2020-02-29 LAB — BASIC METABOLIC PANEL WITH GFR
Anion gap: 5 (ref 5–15)
BUN: 31 mg/dL — ABNORMAL HIGH (ref 8–23)
CO2: 28 mmol/L (ref 22–32)
Calcium: 8 mg/dL — ABNORMAL LOW (ref 8.9–10.3)
Chloride: 104 mmol/L (ref 98–111)
Creatinine, Ser: 1.21 mg/dL (ref 0.61–1.24)
GFR, Estimated: >60 mL/min
Glucose, Bld: 100 mg/dL — ABNORMAL HIGH (ref 70–99)
Potassium: 4.3 mmol/L (ref 3.5–5.1)
Sodium: 137 mmol/L (ref 135–145)

## 2020-02-29 LAB — CULTURE, BLOOD (ROUTINE X 2)
Culture: NO GROWTH
Special Requests: ADEQUATE

## 2020-02-29 LAB — C-REACTIVE PROTEIN: CRP: 0.5 mg/dL (ref <1.0)

## 2020-02-29 MED ORDER — GUAIFENESIN-DM 100-10 MG/5ML PO SYRP: 10.0000 mL | ORAL_SOLUTION | ORAL | 0 refills | Status: DC | PRN

## 2020-02-29 MED ORDER — BENZONATATE 100 MG PO CAPS: 100.0000 mg | ORAL_CAPSULE | Freq: Three times a day (TID) | ORAL | 0 refills | Status: AC | PRN

## 2020-02-29 MED ORDER — ALBUTEROL SULFATE HFA 108 (90 BASE) MCG/ACT IN AERS: 2.0000 | INHALATION_SPRAY | Freq: Four times a day (QID) | RESPIRATORY_TRACT | 0 refills | Status: AC | PRN

## 2020-02-29 MED ORDER — DEXAMETHASONE 6 MG PO TABS: 6.0000 mg | ORAL_TABLET | Freq: Every day | ORAL | 0 refills | Status: DC

## 2020-02-29 NOTE — Plan of Care (Signed)
  Problem: Education: Goal: Knowledge of General Education information will improve Description: Including pain rating scale, medication(s)/side effects and non-pharmacologic comfort measures Outcome: Progressing   Problem: Clinical Measurements: Goal: Ability to maintain clinical measurements within normal limits will improve Outcome: Progressing Goal: Will remain free from infection Outcome: Progressing Goal: Diagnostic test results will improve Outcome: Progressing Goal: Respiratory complications will improve Outcome: Progressing   Problem: Nutrition: Goal: Adequate nutrition will be maintained Outcome: Progressing   Problem: Coping: Goal: Level of anxiety will decrease Outcome: Progressing   Problem: Elimination: Goal: Will not experience complications related to bowel motility Outcome: Progressing Goal: Will not experience complications related to urinary retention Outcome: Progressing   Problem: Pain Managment: Goal: General experience of comfort will improve Outcome: Progressing   Problem: Safety: Goal: Ability to remain free from injury will improve Outcome: Progressing   Problem: Skin Integrity: Goal: Risk for impaired skin integrity will decrease Outcome: Progressing

## 2020-02-29 NOTE — TOC Initial Note (Signed)
Transition of Care [facility]) - Initial/Assessment Note    Patient Details  Name: Paul Mitchell MRN: [id number] Date of Birth: 1950-11-24  [facility] CM/SW Contact:    Allayne Butcher, RN Phone Number: 02/29/2020, 12:43 PM  Clinical Narrative:                 Patient admitted to the hospital with COVID requiring supplemental oxygen.  Patient weaned down to 3L [name] and qualifies for home oxygen.  Patient is medically cleared for discharge home today.  Patient declines home health services.  Patient reports that he lives with his wife and other family members at home they will be able to help him if needed.  Patient is current with PCP, Dr. Harrington Challenger.  Patient verbalizes understanding that he needs to set up PCP appointment for 1 week.  Home Oxygen provided by [employer] and has already been ordered and delivered to the room.  Patient's sister in law is picking him up today.  No other discharge needs noted.   Expected Discharge Plan: Home/Self Care Barriers to Discharge: Barriers Resolved   Patient Goals and CMS Choice Patient states their goals for this hospitalization and ongoing recovery are:: Glad to be going home      Expected Discharge Plan and Services Expected Discharge Plan: Home/Self Care   Discharge Planning Services: CM Consult   Living arrangements for the past 2 months: Single Family Home Expected Discharge Date: 02/29/20               DME Arranged: Oxygen, Pulse oximeter DME Agency: [employer] Date DME Agency Contacted: 02/29/20 Time DME Agency Contacted: 1000 Representative spoke with at DME Agency: Oletha Cruel HH Arranged: Refused HH HH Agency: NA        Prior Living Arrangements/Services Living arrangements for the past 2 months: Single Family Home Lives with:: Spouse Patient language and need for interpreter reviewed:: Yes Do you feel safe going back to the place where you live?: Yes      Need for Family Participation in Patient Care: Yes (Comment)  (COVID) Care giver support system in place?: Yes (comment) (wife, daughter, sister in Social worker)   Criminal Activity/Legal Involvement Pertinent to Current Situation/Hospitalization: No - Comment as needed  Activities of Daily Living Home Assistive Devices/Equipment: None ADL Screening (condition at time of admission) Patient's cognitive ability adequate to safely complete daily activities?: Yes Is the patient deaf or have difficulty hearing?: No Does the patient have difficulty seeing, even when wearing glasses/contacts?: No Does the patient have difficulty concentrating, remembering, or making decisions?: No Patient able to express need for assistance with ADLs?: Yes Does the patient have difficulty dressing or bathing?: No Independently performs ADLs?: Yes (appropriate for developmental age) Does the patient have difficulty walking or climbing stairs?: No Weakness of Legs: None Weakness of Arms/Hands: None  Permission Sought/Granted Permission sought to share information with : Case Manager, Family Supports Permission granted to share information with : Yes, Verbal Permission Granted  Share Information with NAME: Mindi Junker     Permission granted to share info w Relationship: wife     Emotional Assessment Appearance:: Appears stated age Attitude/Demeanor/Rapport: Engaged Affect (typically observed): Accepting Orientation: : Oriented to Self, Oriented to Place, Oriented to  Time, Oriented to Situation Alcohol / Substance Use: Not Applicable Psych Involvement: No (comment)  Admission diagnosis:  Sepsis, due to unspecified organism, unspecified whether acute organ dysfunction present (HCC) [A41.9] Acute hypoxemic respiratory failure due to COVID-19 (HCC) [U07.1, J96.01] Pneumonia due to  COVID-19 virus [U07.1, J12.82] COVID-19 [U07.1] Patient Active Problem List   Diagnosis Date Noted  . Acute hypoxemic respiratory failure due to COVID-19 (HCC) 02/24/2020  . Essential hypertension  02/24/2020   PCP:  Patient, No Pcp Per Pharmacy:   [facility] [address]), [place] - [address] [place]) [place] [address] Phone: [phone] Fax: [phone]     Social Determinants of Health (SDOH) Interventions    Readmission Risk Interventions No flowsheet data found.

## 2020-02-29 NOTE — Discharge Summary (Addendum)
Physician Discharge Summary  Paul Mitchell AOZ:[id number] DOB: 04-Jul-1950 DOA: 02/24/2020  PCP: Patient, No Pcp Per  Admit date: 02/24/2020 Discharge date: 02/29/2020  Admitted From: Home Disposition: Home with home health  Recommendations for Outpatient Follow-up:  1. Follow up with PCP in 1-2 weeks 2.   Home Health: Yes Equipment/Devices: Oxygen 3 L Discharge Condition: Stable CODE STATUS: Full Diet recommendation: Heart Healthy  Brief/Interim Summary:  69 y.o.malewith medical history significant forhypertension presents to the EDwith chief concerns ofshortness of breath and low energy on Monday, 02/22/20 and was tested positive for [facility].  He reports he was tested for Covid on 02/22/2020 and results came back today showing that hetested positive forCovid. He felt worsening shortness of breath and therefore prompted him to present to the ED for further evaluation.  12/2: Patient has been on heated high flow nasal cannula at 60 L overnight.  Saturations low to mid 90s on this rate.  Patient reports improvement in respiratory symptoms since initiation of antivirals, steroids, supplemental oxygen  12/3: Substantial improvement in respiratory status overnight.  Patient was weaned from heated high flow nasal cannula to 3 L.  Respiratory status stabilized.  Patient denies overt shortness of breath or cough.  12/6: Seen and examined the day of discharge.  Oxygen status relatively stable.  Remains on 3 L nasal cannula.  Ambulated around the room.  Did become short of breath and had brief desaturation but quickly recovered.  Stable for discharge home at this time.  Will complete steroid course, 10days total on discharge.  As needed albuterol MDI and Tessalon Perles prescribed.  Covid Home safety instructions included in discharge packet.  Discharge Diagnoses:  Principal Problem:   Acute hypoxemic respiratory failure due to COVID-19 [facility]) Active Problems:   Essential  hypertension  Multifocal pneumonia secondary to COVID-19 Acute hypoxic respiratory failure secondary to above Patient has required heated high flow nasal cannula since admission Normal work of breathing, intact mentation Chest x-ray findings consistent with multifocal infiltrate Procalcitonin negative, do not suspect bacterial coinfection Oxygen requirement and respiratory status slowly improving Completed remdesivir in house IV Solu-Medrol, transition p.o. Decadron on discharge.  Complete 10-day course Received 5 days baricitinib 3 L home oxygen prescribed Follow-up PCP 1 to 2 weeks Covid Home safety instructions included in discharge packet Sepsis (mentioned in ED note) ruled out  Essential hypertension Continue home hydrochlorothiazide 12.5 mg daily Losartan 100 mg daily  Tachycardia Sinus rhythm on telemetry Suspect secondary to hypoxia and acute illness Treat acute issues as above  Discharge Instructions  Discharge Instructions    Diet - low sodium heart healthy   Complete by: As directed    Increase activity slowly   Complete by: As directed      Allergies as of 02/29/2020      Reactions   Enalapril Cough      Medication List    TAKE these medications   albuterol 108 (90 Base) MCG/ACT inhaler Commonly known as: VENTOLIN HFA Inhale 2 puffs into the lungs every 6 (six) hours as needed for wheezing or shortness of breath.   aspirin EC 81 MG tablet Take 81 mg by mouth daily.   benzonatate 100 MG capsule Commonly known as: Tessalon Perles Take 1 capsule (100 mg total) by mouth 3 (three) times daily as needed for cough.   dexamethasone 6 MG tablet Commonly known as: Decadron Take 1 tablet (6 mg total) by mouth daily for 5 days.   furosemide 20 MG tablet Commonly known as:  LASIX Take 20 mg by mouth daily.   guaiFENesin-dextromethorphan 100-10 MG/5ML syrup Commonly known as: ROBITUSSIN DM Take 10 mLs by mouth every 4 (four) hours as needed for cough.    hydrochlorothiazide 12.5 MG capsule Commonly known as: MICROZIDE Take 12.5 mg by mouth every morning.   losartan 100 MG tablet Commonly known as: COZAAR Take 100 mg by mouth daily.            Durable Medical Equipment  (From admission, onward)         Start     Ordered   02/29/20 1039  For home use only DME oxygen  Once       Question Answer Comment  Length of Need 6 Months   Mode or (Route) Nasal cannula   Liters per Minute 3   Frequency Continuous (stationary and portable oxygen unit needed)   Oxygen conserving device Yes   Oxygen delivery system Gas      02/29/20 1038   02/28/20 0848  For home use only DME 3 n 1  Once        02/28/20 0847   02/27/20 1051  For home use only DME Shower stool  Once        02/27/20 1050          Allergies  Allergen Reactions  . Enalapril Cough    Consultations:  None   Procedures/Studies: DG Chest 2 View  Result Date: 02/19/2020 CLINICAL DATA:  Shortness of breath. EXAM: CHEST - 2 VIEW COMPARISON:  CT 12/01/2008. FINDINGS: Mediastinum hilar structures normal. Cardiomegaly. No pulmonary venous congestion. Bilateral pulmonary infiltrates/edema. No pleural effusion or pneumothorax. Degenerative change thoracic spine. IMPRESSION: 1. Cardiomegaly. No pulmonary venous congestion. 2. Bilateral pulmonary infiltrates/edema. Electronically Signed   By: Maisie Fushomas  Register   On: 02/19/2020 13:26   DG Chest Port 1 View  Result Date: 02/24/2020 CLINICAL DATA:  69 year old male with shortness of breath. EXAM: PORTABLE CHEST 1 VIEW COMPARISON:  Chest radiograph dated 02/19/2020. FINDINGS: Significant interval worsening of bilateral pulmonary infiltrate compared to prior radiograph in keeping with worsening multifocal pneumonia. Clinical correlation and follow-up to resolution recommended. No pleural effusion pneumothorax. Stable cardiac silhouette. Atherosclerotic calcification of the aorta. No acute osseous pathology. IMPRESSION: Worsening  multifocal pneumonia. Electronically Signed   By: Elgie CollardArash  Radparvar M.D.   On: 02/24/2020 15:34    (Echo, Carotid, EGD, Colonoscopy, ERCP)    Subjective: Seen and examined on the day of discharge.  Stable, no distress.  Speaking in complete sentences.  Not overtly dyspneic.  Remains dependent on 3 L nasal cannula.  Discharge Exam: Vitals:   02/29/20 0532 02/29/20 0810  BP: 104/66 108/68  Pulse: 76 87  Resp: 18 18  Temp: 97.8 F (36.6 C) 98.5 F (36.9 C)  SpO2: 92% 92%   Vitals:   02/28/20 2009 02/29/20 0024 02/29/20 0532 02/29/20 0810  BP: 109/65 104/65 104/66 108/68  Pulse: 89 77 76 87  Resp: 18 18 18 18   Temp: (!) 97.5 F (36.4 C) 98 F (36.7 C) 97.8 F (36.6 C) 98.5 F (36.9 C)  TempSrc: Oral     SpO2: 92% 94% 92% 92%  Weight:      Height:        General: Pt is alert, awake, not in acute distress Cardiovascular: RRR, S1/S2 +, no rubs, no gallops Respiratory: Bibasilar crackles.  Normal work of breathing.  3 L Abdominal: Soft, NT, ND, bowel sounds + Extremities: no edema, no cyanosis    The results of  significant diagnostics from this hospitalization (including imaging, microbiology, ancillary and laboratory) are listed below for reference.     Microbiology: Recent Results (from the past 240 hour(s))  Culture, blood (Routine X 2) w Reflex to ID Panel     Status: None   Collection Time: 02/24/20  3:44 PM   Specimen: BLOOD  Result Value Ref Range Status   Specimen Description BLOOD RIGHT ANTECUBITAL  Final   Special Requests   Final    BOTTLES DRAWN AEROBIC AND ANAEROBIC Blood Culture adequate volume   Culture   Final    NO GROWTH 5 DAYS Performed at [facility], [address] [place], [place] [address]    Report Status 02/29/2020 FINAL  Final  Culture, blood (Routine X 2) w Reflex to ID Panel     Status: None   Collection Time: 02/24/20  3:44 PM   Specimen: BLOOD  Result Value Ref Range Status   Specimen Description BLOOD LEFT ANTECUBITAL   Final   Special Requests   Final    BOTTLES DRAWN AEROBIC AND ANAEROBIC Blood Culture results may not be optimal due to an excessive volume of blood received in culture bottles   Culture   Final    NO GROWTH 5 DAYS Performed at [facility], [address] [place], [place] [address]    Report Status 02/29/2020 FINAL  Final  Resp Panel by RT-PCR (Flu A&B, Covid)     Status: Abnormal   Collection Time: 02/24/20  8:23 PM  Result Value Ref Range Status   SARS Coronavirus 2 by RT PCR POSITIVE (A) NEGATIVE Final    Comment: RESULT CALLED TO, READ BACK BY AND VERIFIED WITH: ZAC REGISTER @2150  ON 02/24/20 SKL (NOTE) SARS-CoV-2 target nucleic acids are DETECTED.  The SARS-CoV-2 RNA is generally detectable in upper respiratory specimens during the acute phase of infection. Positive results are indicative of the presence of the identified virus, but do not rule out bacterial infection or co-infection with other pathogens not detected by the test. Clinical correlation with patient history and other diagnostic information is necessary to determine patient infection status. The expected result is Negative.  Fact Sheet for Patients: 14/01/21  Fact Sheet for Healthcare Providers: BloggerCourse.com  This test is not yet approved or cleared by the [employer]ious[employer].[place] FDA and  has been authorized for detection and/or diagnosis of SARS-CoV-2 by FDA under an Emergency Use Authorization (EUA).  This EUA will remain in effect (meaning this test can b e used) for the duration of  the COVID-19 declaration under Section 564(b)(1) of the Act, 21 U.S.C. section 360bbb-3(b)(1), unless the authorization is terminated or revoked sooner.     Influenza A by PCR NEGATIVE NEGATIVE Final   Influenza B by PCR NEGATIVE NEGATIVE Final    Comment: (NOTE) The Xpert Xpress SARS-CoV-2/FLU/RSV plus assay is intended as an aid in the diagnosis of influenza  from Nasopharyngeal swab specimens and should not be used as a sole basis for treatment. Nasal washings and aspirates are unacceptable for Xpert Xpress SARS-CoV-2/FLU/RSV testing.  Fact Sheet for Patients: [place]  Fact Sheet for Healthcare Providers: BloggerCourse.com  This test is not yet approved or cleared by the SeriousBroker.it FDA and has been authorized for detection and/or diagnosis of SARS-CoV-2 by FDA under an Emergency Use Authorization (EUA). This EUA will remain in effect (meaning this test can be used) for the duration of the COVID-19 declaration under Section 564(b)(1) of the Act, 21 U.S.C. section 360bbb-3(b)(1), unless the authorization  is terminated or revoked.  Performed at [facility], [address] [place], [place] [address]      Labs: BNP (last 3 results) No results for input(s): BNP in the last 8760 hours. Basic Metabolic Panel: Recent Labs  Lab 02/24/20 1528 02/27/20 0424 02/28/20 0415 02/29/20 0431  NA 135 138 136 137  K 3.5 4.0 4.6 4.3  CL 99 103 103 104  CO2 26 24 25 28   GLUCOSE 124* 175* 165* 100*  BUN 22 43* 38* 31*  CREATININE 1.09 1.24 1.12 1.21  CALCIUM 7.8* 7.9* 7.9* 8.0*   Liver Function Tests: No results for input(s): AST, ALT, ALKPHOS, BILITOT, PROT, ALBUMIN in the last 168 hours. No results for input(s): LIPASE, AMYLASE in the last 168 hours. No results for input(s): AMMONIA in the last 168 hours. CBC: Recent Labs  Lab 02/24/20 1528 02/27/20 0424 02/28/20 0415 02/29/20 0431  WBC 6.7 8.7 6.5 8.7  NEUTROABS  --  6.7 4.7 5.5  HGB 13.9 12.4* 11.9* 12.0*  HCT 40.6 36.6* 34.4* 35.3*  MCV 87.9 88.8 88.9 89.4  PLT 196 258 233 237   Cardiac Enzymes: No results for input(s): CKTOTAL, CKMB, CKMBINDEX, TROPONINI in the last 168 hours. BNP: Invalid input(s): POCBNP CBG: No results for input(s): GLUCAP in the last 168 hours. D-Dimer No results for input(s):  DDIMER in the last 72 hours. Hgb A1c No results for input(s): HGBA1C in the last 72 hours. Lipid Profile No results for input(s): CHOL, HDL, LDLCALC, TRIG, CHOLHDL, LDLDIRECT in the last 72 hours. Thyroid function studies No results for input(s): TSH, T4TOTAL, T3FREE, THYROIDAB in the last 72 hours.  Invalid input(s): FREET3 Anemia work up No results for input(s): VITAMINB12, FOLATE, FERRITIN, TIBC, IRON, RETICCTPCT in the last 72 hours. Urinalysis No results found for: COLORURINE, APPEARANCEUR, LABSPEC, PHURINE, GLUCOSEU, HGBUR, BILIRUBINUR, KETONESUR, PROTEINUR, UROBILINOGEN, NITRITE, LEUKOCYTESUR Sepsis Labs Invalid input(s): PROCALCITONIN,  WBC,  LACTICIDVEN Microbiology Recent Results (from the past 240 hour(s))  Culture, blood (Routine X 2) w Reflex to ID Panel     Status: None   Collection Time: 02/24/20  3:44 PM   Specimen: BLOOD  Result Value Ref Range Status   Specimen Description BLOOD RIGHT ANTECUBITAL  Final   Special Requests   Final    BOTTLES DRAWN AEROBIC AND ANAEROBIC Blood Culture adequate volume   Culture   Final    NO GROWTH 5 DAYS Performed at [facility], [address] [place], [place]    Report Status 02/29/2020 FINAL  Final  Culture, blood (Routine X 2) w Reflex to ID Panel     Status: None   Collection Time: 02/24/20  3:44 PM   Specimen: BLOOD  Result Value Ref Range Status   Specimen Description BLOOD LEFT ANTECUBITAL  Final   Special Requests   Final    BOTTLES DRAWN AEROBIC AND ANAEROBIC Blood Culture results may not be optimal due to an excessive volume of blood received in culture bottles   Culture   Final    NO GROWTH 5 DAYS Performed at [facility], [address], [place], [place]    Report Status 02/29/2020 FINAL  Final  Resp Panel by RT-PCR (Flu A&B, Covid)     Status: Abnormal   Collection Time: 02/24/20  8:23 PM  Result Value Ref Range Status   SARS Coronavirus 2 by RT PCR POSITIVE (A)  NEGATIVE Final    Comment: RESULT CALLED TO, READ BACK BY AND VERIFIED WITH: ZAC  REGISTER @2150  ON 02/24/20 SKL (NOTE) SARS-CoV-2 target nucleic acids are DETECTED.  The SARS-CoV-2 RNA is generally detectable in upper respiratory specimens during the acute phase of infection. Positive results are indicative of the presence of the identified virus, but do not rule out bacterial infection or co-infection with other pathogens not detected by the test. Clinical correlation with patient history and other diagnostic information is necessary to determine patient infection status. The expected result is Negative.  Fact Sheet for Patients: 14/01/21  Fact Sheet for Healthcare Providers: BloggerCourse.com  This test is not yet approved or cleared by the [employer] FDA and  has been authorized for detection and/or diagnosis of SARS-CoV-2 by FDA under an Emergency Use Authorization (EUA).  This EUA will remain in effect (meaning this test can b e used) for the duration of  the COVID-19 declaration under Section 564(b)(1) of the Act, 21 U.S.C. section 360bbb-3(b)(1), unless the authorization is terminated or revoked sooner.     Influenza A by PCR NEGATIVE NEGATIVE Final   Influenza B by PCR NEGATIVE NEGATIVE Final    Comment: (NOTE) The Xpert Xpress SARS-CoV-2/FLU/RSV plus assay is intended as an aid in the diagnosis of influenza from Nasopharyngeal swab specimens and should not be used as a sole basis for treatment. Nasal washings and aspirates are unacceptable for Xpert Xpress SARS-CoV-2/FLU/RSV testing.  Fact Sheet for Patients: [place]  Fact Sheet for Healthcare Providers: BloggerCourse.com  This test is not yet approved or cleared by the [employer]ious[employer] FDA and has been authorized for detection and/or diagnosis of SARS-CoV-2 by FDA under an Emergency Use  Authorization (EUA). This EUA will remain in effect (meaning this test can be used) for the duration of the COVID-19 declaration under Section 564(b)(1) of the Act, 21 U.S.C. section 360bbb-3(b)(1), unless the authorization is terminated or revoked.  Performed at [facility], [address], [place], [place]      Time coordinating discharge: Over 30 minutes  SIGNED:   [id number], MD  [facility] 02/29/2020, 10:43 AM Pager   If 7PM-7AM, please contact night-coverage

## 2020-02-29 NOTE — Discharge Instructions (Signed)
10 Things You Can Do to Manage Your COVID-19 Symptoms at Home If you have possible or confirmed COVID-19: 1. Stay home from work and school. And stay away from other public places. If you must go out, avoid using any kind of public transportation, ridesharing, or taxis. 2. Monitor your symptoms carefully. If your symptoms get worse, call your healthcare provider immediately. 3. Get rest and stay hydrated. 4. If you have a medical appointment, call the healthcare provider ahead of time and tell them that you have or may have COVID-19. 5. For medical emergencies, call 911 and notify the dispatch personnel that you have or may have COVID-19. 6. Cover your cough and sneezes with a tissue or use the inside of your elbow. 7. Wash your hands often with soap and water for at least 20 seconds or clean your hands with an alcohol-based hand sanitizer that contains at least 60% alcohol. 8. As much as possible, stay in a specific room and away from other people in your home. Also, you should use a separate bathroom, if available. If you need to be around other people in or outside of the home, wear a mask. 9. Avoid sharing personal items with other people in your household, like dishes, towels, and bedding. 10. Clean all surfaces that are touched often, like counters, tabletops, and doorknobs. Use household cleaning sprays or wipes according to the label instructions. cdc.gov/coronavirus 09/24/2018 This information is not intended to replace advice given to you by your health care provider. Make sure you discuss any questions you have with your health care provider. Document Revised: 02/26/2019 Document Reviewed: 02/26/2019 Elsevier Patient Education  2020 [employer]   COVID-19 COVID-19 is a respiratory infection that is caused by a virus called severe acute respiratory syndrome coronavirus 2 (SARS-CoV-2). The disease is also known as coronavirus disease or novel coronavirus. In some people, the virus may  not cause any symptoms. In others, it may cause a serious infection. The infection can get worse quickly and can lead to complications, such as:  Pneumonia, or infection of the lungs.  Acute respiratory distress syndrome or ARDS. This is a condition in which fluid build-up in the lungs prevents the lungs from filling with air and passing oxygen into the blood.  Acute respiratory failure. This is a condition in which there is not enough oxygen passing from the lungs to the body or when carbon dioxide is not passing from the lungs out of the body.  Sepsis or septic shock. This is a serious bodily reaction to an infection.  Blood clotting problems.  Secondary infections due to bacteria or fungus.  Organ failure. This is when your body's organs stop working. The virus that causes COVID-19 is contagious. This means that it can spread from person to person through droplets from coughs and sneezes (respiratory secretions). What are the causes? This illness is caused by a virus. You may catch the virus by:  Breathing in droplets from an infected person. Droplets can be spread by a person breathing, speaking, singing, coughing, or sneezing.  Touching something, like a table or a doorknob, that was exposed to the virus (contaminated) and then touching your mouth, nose, or eyes. What increases the risk? Risk for infection You are more likely to be infected with this virus if you:  Are within 6 feet (2 meters) of a person with COVID-19.  Provide care for or live with a person who is infected with COVID-19.  Spend time in crowded indoor spaces or   live in shared housing. Risk for serious illness You are more likely to become seriously ill from the virus if you:  Are 50 years of age or older. The higher your age, the more you are at risk for serious illness.  Live in a nursing home or long-term care facility.  Have cancer.  Have a long-term (chronic) disease such as: ? Chronic lung disease,  including chronic obstructive pulmonary disease or asthma. ? A long-term disease that lowers your body's ability to fight infection (immunocompromised). ? Heart disease, including heart failure, a condition in which the arteries that lead to the heart become narrow or blocked (coronary artery disease), a disease which makes the heart muscle thick, weak, or stiff (cardiomyopathy). ? Diabetes. ? Chronic kidney disease. ? Sickle cell disease, a condition in which red blood cells have an abnormal "sickle" shape. ? Liver disease.  Are obese. What are the signs or symptoms? Symptoms of this condition can range from mild to severe. Symptoms may appear any time from 2 to 14 days after being exposed to the virus. They include:  A fever or chills.  A cough.  Difficulty breathing.  Headaches, body aches, or muscle aches.  Runny or stuffy (congested) nose.  A sore throat.  New loss of taste or smell. Some people may also have stomach problems, such as nausea, vomiting, or diarrhea. Other people may not have any symptoms of COVID-19. How is this diagnosed? This condition may be diagnosed based on:  Your signs and symptoms, especially if: ? You live in an area with a COVID-19 outbreak. ? You recently traveled to or from an area where the virus is common. ? You provide care for or live with a person who was diagnosed with COVID-19. ? You were exposed to a person who was diagnosed with COVID-19.  A physical exam.  Lab tests, which may include: ? Taking a sample of fluid from the back of your nose and throat (nasopharyngeal fluid), your nose, or your throat using a swab. ? A sample of mucus from your lungs (sputum). ? Blood tests.  Imaging tests, which may include, X-rays, CT scan, or ultrasound. How is this treated? At present, there is no medicine to treat COVID-19. Medicines that treat other diseases are being used on a trial basis to see if they are effective against COVID-19. Your  health care provider will talk with you about ways to treat your symptoms. For most people, the infection is mild and can be managed at home with rest, fluids, and over-the-counter medicines. Treatment for a serious infection usually takes places in a hospital intensive care unit (ICU). It may include one or more of the following treatments. These treatments are given until your symptoms improve.  Receiving fluids and medicines through an IV.  Supplemental oxygen. Extra oxygen is given through a tube in the nose, a face mask, or a hood.  Positioning you to lie on your stomach (prone position). This makes it easier for oxygen to get into the lungs.  Continuous positive airway pressure (CPAP) or bi-level positive airway pressure (BPAP) machine. This treatment uses mild air pressure to keep the airways open. A tube that is connected to a motor delivers oxygen to the body.  Ventilator. This treatment moves air into and out of the lungs by using a tube that is placed in your windpipe.  Tracheostomy. This is a procedure to create a hole in the neck so that a breathing tube can be inserted.  Extracorporeal membrane   oxygenation (ECMO). This procedure gives the lungs a chance to recover by taking over the functions of the heart and lungs. It supplies oxygen to the body and removes carbon dioxide. Follow these instructions at home: Lifestyle  If you are sick, stay home except to get medical care. Your health care provider will tell you how long to stay home. Call your health care provider before you go for medical care.  Rest at home as told by your health care provider.  Do not use any products that contain nicotine or tobacco, such as cigarettes, e-cigarettes, and chewing tobacco. If you need help quitting, ask your health care provider.  Return to your normal activities as told by your health care provider. Ask your health care provider what activities are safe for you. General  instructions  Take over-the-counter and prescription medicines only as told by your health care provider.  Drink enough fluid to keep your urine pale yellow.  Keep all follow-up visits as told by your health care provider. This is important. How is this prevented?  There is no vaccine to help prevent COVID-19 infection. However, there are steps you can take to protect yourself and others from this virus. To protect yourself:   Do not travel to areas where COVID-19 is a risk. The areas where COVID-19 is reported change often. To identify high-risk areas and travel restrictions, check the CDC travel website: wwwnc.cdc.gov/travel/notices  If you live in, or must travel to, an area where COVID-19 is a risk, take precautions to avoid infection. ? Stay away from people who are sick. ? Wash your hands often with soap and water for 20 seconds. If soap and water are not available, use an alcohol-based hand sanitizer. ? Avoid touching your mouth, face, eyes, or nose. ? Avoid going out in public, follow guidance from your state and local health authorities. ? If you must go out in public, wear a cloth face covering or face mask. Make sure your mask covers your nose and mouth. ? Avoid crowded indoor spaces. Stay at least 6 feet (2 meters) away from others. ? Disinfect objects and surfaces that are frequently touched every day. This may include:  Counters and tables.  Doorknobs and light switches.  Sinks and faucets.  Electronics, such as phones, remote controls, keyboards, computers, and tablets. To protect others: If you have symptoms of COVID-19, take steps to prevent the virus from spreading to others.  If you think you have a COVID-19 infection, contact your health care provider right away. Tell your health care team that you think you may have a COVID-19 infection.  Stay home. Leave your house only to seek medical care. Do not use public transport.  Do not travel while you are  sick.  Wash your hands often with soap and water for 20 seconds. If soap and water are not available, use alcohol-based hand sanitizer.  Stay away from other members of your household. Let healthy household members care for children and pets, if possible. If you have to care for children or pets, wash your hands often and wear a mask. If possible, stay in your own room, separate from others. Use a different bathroom.  Make sure that all people in your household wash their hands well and often.  Cough or sneeze into a tissue or your sleeve or elbow. Do not cough or sneeze into your hand or into the air.  Wear a cloth face covering or face mask. Make sure your mask covers your nose   and mouth. Where to find more information  Centers for Disease Control and Prevention: www.cdc.gov/coronavirus/2019-ncov/index.html  World Health Organization: www.who.int/health-topics/coronavirus Contact a health care provider if:  You live in or have traveled to an area where COVID-19 is a risk and you have symptoms of the infection.  You have had contact with someone who has COVID-19 and you have symptoms of the infection. Get help right away if:  You have trouble breathing.  You have pain or pressure in your chest.  You have confusion.  You have bluish lips and fingernails.  You have difficulty waking from sleep.  You have symptoms that get worse. These symptoms may represent a serious problem that is an emergency. Do not wait to see if the symptoms will go away. Get medical help right away. Call your local emergency services (911 in the [place] Do not drive yourself to the hospital. Let the emergency medical personnel know if you think you have COVID-19. Summary  COVID-19 is a respiratory infection that is caused by a virus. It is also known as coronavirus disease or novel coronavirus. It can cause serious infections, such as pneumonia, acute respiratory distress syndrome, acute respiratory failure,  or sepsis.  The virus that causes COVID-19 is contagious. This means that it can spread from person to person through droplets from breathing, speaking, singing, coughing, or sneezing.  You are more likely to develop a serious illness if you are 50 years of age or older, have a weak immune system, live in a nursing home, or have chronic disease.  There is no medicine to treat COVID-19. Your health care provider will talk with you about ways to treat your symptoms.  Take steps to protect yourself and others from infection. Wash your hands often and disinfect objects and surfaces that are frequently touched every day. Stay away from people who are sick and wear a mask if you are sick. This information is not intended to replace advice given to you by your health care provider. Make sure you discuss any questions you have with your health care provider. Document Revised: 01/09/2019 Document Reviewed: 04/17/2018 Elsevier Patient Education  2020 [employer] Inc.  COVID-19: How to Protect Yourself and Others Know how it spreads  There is currently no vaccine to prevent coronavirus disease 2019 (COVID-19).  The best way to prevent illness is to avoid being exposed to this virus.  The virus is thought to spread mainly from person-to-person. ? Between people who are in close contact with one another (within about 6 feet). ? Through respiratory droplets produced when an infected person coughs, sneezes or talks. ? These droplets can land in the mouths or noses of people who are nearby or possibly be inhaled into the lungs. ? COVID-19 may be spread by people who are not showing symptoms. Everyone should Clean your hands often  Wash your hands often with soap and water for at least 20 seconds especially after you have been in a public place, or after blowing your nose, coughing, or sneezing.  If soap and water are not readily available, use a hand sanitizer that contains at least 60% alcohol. Cover  all surfaces of your hands and rub them together until they feel dry.  Avoid touching your eyes, nose, and mouth with unwashed hands. Avoid close contact  Limit contact with others as much as possible.  Avoid close contact with people who are sick.  Put distance between yourself and other people. ? Remember that some people without symptoms may be   able to spread virus. ? This is especially important for people who are at higher risk of getting very sick.www.cdc.gov/coronavirus/2019-ncov/need-extra-precautions/people-at-higher-risk.html Cover your mouth and nose with a mask when around others  You could spread COVID-19 to others even if you do not feel sick.  Everyone should wear a mask in public settings and when around people not living in their household, especially when social distancing is difficult to maintain. ? Masks should not be placed on young children under age 2, anyone who has trouble breathing, or is unconscious, incapacitated or otherwise unable to remove the mask without assistance.  The mask is meant to protect other people in case you are infected.  Do NOT use a facemask meant for a healthcare worker.  Continue to keep about 6 feet between yourself and others. The mask is not a substitute for social distancing. Cover coughs and sneezes  Always cover your mouth and nose with a tissue when you cough or sneeze or use the inside of your elbow.  Throw used tissues in the trash.  Immediately wash your hands with soap and water for at least 20 seconds. If soap and water are not readily available, clean your hands with a hand sanitizer that contains at least 60% alcohol. Clean and disinfect  Clean AND disinfect frequently touched surfaces daily. This includes tables, doorknobs, light switches, countertops, handles, desks, phones, keyboards, toilets, faucets, and sinks. www.cdc.gov/coronavirus/2019-ncov/prevent-getting-sick/disinfecting-your-home.html  If surfaces are  dirty, clean them: Use detergent or soap and water prior to disinfection.  Then, use a household disinfectant. You can see a list of EPA-registered household disinfectants here. cdc.gov/coronavirus 11/26/2018 This information is not intended to replace advice given to you by your health care provider. Make sure you discuss any questions you have with your health care provider. Document Revised: 12/04/2018 Document Reviewed: 10/02/2018 Elsevier Patient Education  2020 [employer] Inc.  

## 2020-02-29 NOTE — Progress Notes (Signed)
Took IV out before discharge. Went over discharge instructions and medications with patient. All questions answered and patient stated that he understood. Patient going home POV.

## 2020-02-29 NOTE — Progress Notes (Signed)
SATURATION QUALIFICATIONS: (This note is used to comply with regulatory documentation for home oxygen)  Patient Saturations on Room Air at Rest =89%  Patient Saturations on Room Air while Ambulating = 84%  Patient Saturations on 3 Liters of oxygen while Ambulating = 90%  Please briefly explain why patient needs home oxygen:

## 2020-03-03 ENCOUNTER — Inpatient Hospital Stay: Payer: Managed Care, Other (non HMO)

## 2020-03-03 ENCOUNTER — Inpatient Hospital Stay
Admission: EM | Admit: 2020-03-03 | Discharge: 2020-03-10 | DRG: 871 | Disposition: A | Discharge status: Home / Self Care | Payer: Managed Care, Other (non HMO) | Attending: Internal Medicine | Admitting: Internal Medicine

## 2020-03-03 ENCOUNTER — Other Ambulatory Visit: Payer: Self-pay

## 2020-03-03 ENCOUNTER — Emergency Department: Payer: Managed Care, Other (non HMO)

## 2020-03-03 ENCOUNTER — Encounter: Payer: Self-pay | Admitting: Emergency Medicine

## 2020-03-03 DIAGNOSIS — I081 Rheumatic disorders of both mitral and tricuspid valves: Secondary | ICD-10-CM | POA: Diagnosis present

## 2020-03-03 DIAGNOSIS — R739 Hyperglycemia, unspecified: Secondary | ICD-10-CM | POA: Diagnosis present

## 2020-03-03 DIAGNOSIS — R6521 Severe sepsis with septic shock: Secondary | ICD-10-CM | POA: Diagnosis present

## 2020-03-03 DIAGNOSIS — Z7901 Long term (current) use of anticoagulants: Secondary | ICD-10-CM

## 2020-03-03 DIAGNOSIS — J189 Pneumonia, unspecified organism: Secondary | ICD-10-CM | POA: Diagnosis present

## 2020-03-03 DIAGNOSIS — I4891 Unspecified atrial fibrillation: Secondary | ICD-10-CM

## 2020-03-03 DIAGNOSIS — E872 Acidosis: Secondary | ICD-10-CM | POA: Diagnosis present

## 2020-03-03 DIAGNOSIS — I2699 Other pulmonary embolism without acute cor pulmonale: Secondary | ICD-10-CM | POA: Diagnosis present

## 2020-03-03 DIAGNOSIS — E875 Hyperkalemia: Secondary | ICD-10-CM | POA: Diagnosis present

## 2020-03-03 DIAGNOSIS — I951 Orthostatic hypotension: Secondary | ICD-10-CM | POA: Diagnosis present

## 2020-03-03 DIAGNOSIS — I248 Other forms of acute ischemic heart disease: Secondary | ICD-10-CM | POA: Diagnosis present

## 2020-03-03 DIAGNOSIS — I10 Essential (primary) hypertension: Secondary | ICD-10-CM | POA: Diagnosis present

## 2020-03-03 DIAGNOSIS — I480 Paroxysmal atrial fibrillation: Secondary | ICD-10-CM | POA: Diagnosis present

## 2020-03-03 DIAGNOSIS — J1282 Pneumonia due to coronavirus disease 2019: Secondary | ICD-10-CM | POA: Diagnosis present

## 2020-03-03 DIAGNOSIS — I824Y2 Acute embolism and thrombosis of unspecified deep veins of left proximal lower extremity: Secondary | ICD-10-CM

## 2020-03-03 DIAGNOSIS — R0602 Shortness of breath: Secondary | ICD-10-CM

## 2020-03-03 DIAGNOSIS — J9601 Acute respiratory failure with hypoxia: Secondary | ICD-10-CM | POA: Diagnosis present

## 2020-03-03 DIAGNOSIS — Y95 Nosocomial condition: Secondary | ICD-10-CM | POA: Diagnosis present

## 2020-03-03 DIAGNOSIS — A419 Sepsis, unspecified organism: Secondary | ICD-10-CM | POA: Diagnosis present

## 2020-03-03 DIAGNOSIS — J9621 Acute and chronic respiratory failure with hypoxia: Secondary | ICD-10-CM | POA: Diagnosis present

## 2020-03-03 DIAGNOSIS — I824Z2 Acute embolism and thrombosis of unspecified deep veins of left distal lower extremity: Secondary | ICD-10-CM | POA: Diagnosis present

## 2020-03-03 DIAGNOSIS — I82432 Acute embolism and thrombosis of left popliteal vein: Secondary | ICD-10-CM | POA: Diagnosis present

## 2020-03-03 DIAGNOSIS — Z452 Encounter for adjustment and management of vascular access device: Secondary | ICD-10-CM

## 2020-03-03 DIAGNOSIS — N179 Acute kidney failure, unspecified: Secondary | ICD-10-CM | POA: Diagnosis present

## 2020-03-03 DIAGNOSIS — R7303 Prediabetes: Secondary | ICD-10-CM | POA: Diagnosis present

## 2020-03-03 DIAGNOSIS — U071 COVID-19: Secondary | ICD-10-CM | POA: Diagnosis present

## 2020-03-03 DIAGNOSIS — T380X5A Adverse effect of glucocorticoids and synthetic analogues, initial encounter: Secondary | ICD-10-CM | POA: Diagnosis present

## 2020-03-03 DIAGNOSIS — Z79899 Other long term (current) drug therapy: Secondary | ICD-10-CM | POA: Diagnosis not present

## 2020-03-03 DIAGNOSIS — R7989 Other specified abnormal findings of blood chemistry: Secondary | ICD-10-CM | POA: Diagnosis not present

## 2020-03-03 DIAGNOSIS — N170 Acute kidney failure with tubular necrosis: Secondary | ICD-10-CM | POA: Diagnosis not present

## 2020-03-03 LAB — CBC WITH DIFFERENTIAL/PLATELET
Abs Immature Granulocytes: 0.2 10*3/uL — ABNORMAL HIGH (ref 0.00–0.07)
Basophils Absolute: 0 10*3/uL (ref 0.0–0.1)
Basophils Relative: 0 %
Eosinophils Absolute: 0 10*3/uL (ref 0.0–0.5)
Eosinophils Relative: 0 %
HCT: 38.1 % — ABNORMAL LOW (ref 39.0–52.0)
Hemoglobin: 12.8 g/dL — ABNORMAL LOW (ref 13.0–17.0)
Immature Granulocytes: 1 %
Lymphocytes Relative: 11 %
Lymphs Abs: 2 10*3/uL (ref 0.7–4.0)
MCH: 30.5 pg (ref 26.0–34.0)
MCHC: 33.6 g/dL (ref 30.0–36.0)
MCV: 90.7 fL (ref 80.0–100.0)
Monocytes Absolute: 2.3 10*3/uL — ABNORMAL HIGH (ref 0.1–1.0)
Monocytes Relative: 13 %
Neutro Abs: 13.3 10*3/uL — ABNORMAL HIGH (ref 1.7–7.7)
Neutrophils Relative %: 75 %
Platelets: 233 10*3/uL (ref 150–400)
RBC: 4.2 MIL/uL — ABNORMAL LOW (ref 4.22–5.81)
RDW: 13.4 % (ref 11.5–15.5)
WBC: 17.8 10*3/uL — ABNORMAL HIGH (ref 4.0–10.5)
nRBC: 0 % (ref 0.0–0.2)

## 2020-03-03 LAB — URINALYSIS, COMPLETE (UACMP) WITH MICROSCOPIC
Glucose, UA: 50 mg/dL — AB
Hgb urine dipstick: NEGATIVE
Ketones, ur: NEGATIVE mg/dL
Leukocytes,Ua: NEGATIVE
Nitrite: NEGATIVE
Protein, ur: 100 mg/dL — AB
Specific Gravity, Urine: 1.029 (ref 1.005–1.030)
Squamous Epithelial / HPF: NONE SEEN (ref 0–5)
pH: 5 (ref 5.0–8.0)

## 2020-03-03 LAB — BRAIN NATRIURETIC PEPTIDE: B Natriuretic Peptide: 253.8 pg/mL — ABNORMAL HIGH (ref 0.0–100.0)

## 2020-03-03 LAB — TROPONIN I (HIGH SENSITIVITY)
Troponin I (High Sensitivity): 1064 ng/L (ref <18)
Troponin I (High Sensitivity): 1145 ng/L (ref <18)
Troponin I (High Sensitivity): 1064 ng/L (ref <18)

## 2020-03-03 LAB — PROTIME-INR
INR: 1.2 (ref 0.8–1.2)
Prothrombin Time: 14.9 seconds (ref 11.4–15.2)

## 2020-03-03 LAB — CBC
HCT: 43.3 % (ref 39.0–52.0)
Hemoglobin: 14.3 g/dL (ref 13.0–17.0)
MCH: 30.6 pg (ref 26.0–34.0)
MCHC: 33 g/dL (ref 30.0–36.0)
MCV: 92.5 fL (ref 80.0–100.0)
Platelets: 265 10*3/uL (ref 150–400)
RBC: 4.68 MIL/uL (ref 4.22–5.81)
RDW: 13.2 % (ref 11.5–15.5)
WBC: 17.8 10*3/uL — ABNORMAL HIGH (ref 4.0–10.5)
nRBC: 0 % (ref 0.0–0.2)

## 2020-03-03 LAB — BASIC METABOLIC PANEL
Anion gap: 14 (ref 5–15)
BUN: 34 mg/dL — ABNORMAL HIGH (ref 8–23)
CO2: 21 mmol/L — ABNORMAL LOW (ref 22–32)
Calcium: 8.3 mg/dL — ABNORMAL LOW (ref 8.9–10.3)
Chloride: 100 mmol/L (ref 98–111)
Creatinine, Ser: 2.19 mg/dL — ABNORMAL HIGH (ref 0.61–1.24)
GFR, Estimated: 32 mL/min — ABNORMAL LOW (ref >60)
Glucose, Bld: 228 mg/dL — ABNORMAL HIGH (ref 70–99)
Potassium: 4.6 mmol/L (ref 3.5–5.1)
Sodium: 135 mmol/L (ref 135–145)

## 2020-03-03 LAB — APTT: aPTT: 30 seconds (ref 24–36)

## 2020-03-03 LAB — HEPARIN LEVEL (UNFRACTIONATED): Heparin Unfractionated: 0.85 IU/mL — ABNORMAL HIGH (ref 0.30–0.70)

## 2020-03-03 LAB — FERRITIN: Ferritin: 708 ng/mL — ABNORMAL HIGH (ref 24–336)

## 2020-03-03 LAB — PROCALCITONIN: Procalcitonin: 0.16 ng/mL

## 2020-03-03 LAB — GLUCOSE, CAPILLARY
Glucose-Capillary: 227 mg/dL — ABNORMAL HIGH (ref 70–99)
Glucose-Capillary: 152 mg/dL — ABNORMAL HIGH (ref 70–99)

## 2020-03-03 LAB — HEMOGLOBIN A1C
Hgb A1c MFr Bld: 6.4 % — ABNORMAL HIGH (ref 4.8–5.6)
Mean Plasma Glucose: 136.98 mg/dL

## 2020-03-03 LAB — LACTIC ACID, PLASMA
Lactic Acid, Venous: 4.6 mmol/L (ref 0.5–1.9)
Lactic Acid, Venous: 2.8 mmol/L (ref 0.5–1.9)

## 2020-03-03 LAB — RESP PANEL BY RT-PCR (FLU A&B, COVID) ARPGX2
Influenza A by PCR: NEGATIVE
Influenza B by PCR: NEGATIVE
SARS Coronavirus 2 by RT PCR: POSITIVE — AB

## 2020-03-03 LAB — C-REACTIVE PROTEIN: CRP: 1.1 mg/dL — ABNORMAL HIGH (ref <1.0)

## 2020-03-03 LAB — FIBRIN DERIVATIVES D-DIMER (ARMC ONLY): Fibrin derivatives D-dimer (ARMC): 7453.38 ng/mL (FEU) — ABNORMAL HIGH (ref 0.00–499.00)

## 2020-03-03 LAB — MAGNESIUM: Magnesium: 2.4 mg/dL (ref 1.7–2.4)

## 2020-03-03 LAB — MRSA PCR SCREENING: MRSA by PCR: NEGATIVE

## 2020-03-03 MED ORDER — HEPARIN BOLUS VIA INFUSION
7500.0000 [IU] | Freq: Once | INTRAVENOUS | Status: AC
  Administered 2020-03-03: 7500 [IU] via INTRAVENOUS
  Filled 2020-03-03: qty 7500

## 2020-03-03 MED ORDER — AMIODARONE HCL IN DEXTROSE 360-4.14 MG/200ML-% IV SOLN
60.0000 mg/h | INTRAVENOUS | Status: AC
  Administered 2020-03-03: 60 mg/h via INTRAVENOUS
  Filled 2020-03-03: qty 200

## 2020-03-03 MED ORDER — ZINC SULFATE 220 (50 ZN) MG PO CAPS
220.0000 mg | ORAL_CAPSULE | Freq: Every day | ORAL | Status: DC
  Administered 2020-03-03 – 2020-03-10 (×8): 220 mg via ORAL
  Filled 2020-03-03 (×9): qty 1

## 2020-03-03 MED ORDER — SODIUM CHLORIDE 0.9 % IV SOLN
2.0000 g | Freq: Two times a day (BID) | INTRAVENOUS | Status: DC
  Administered 2020-03-03 – 2020-03-06 (×6): 2 g via INTRAVENOUS
  Filled 2020-03-03 (×5): qty 2
  Filled 2020-03-03: qty 0.4
  Filled 2020-03-03 (×3): qty 2

## 2020-03-03 MED ORDER — NOREPINEPHRINE 16 MG/250ML-% IV SOLN
0.0000 ug/min | INTRAVENOUS | Status: DC
  Administered 2020-03-03: 8 ug/min via INTRAVENOUS
  Administered 2020-03-05: 4 ug/min via INTRAVENOUS
  Filled 2020-03-03 (×2): qty 250

## 2020-03-03 MED ORDER — SODIUM CHLORIDE 0.9 % IV BOLUS
1000.0000 mL | Freq: Once | INTRAVENOUS | Status: AC
  Administered 2020-03-03: 1000 mL via INTRAVENOUS

## 2020-03-03 MED ORDER — SODIUM CHLORIDE 0.9 % IV SOLN
2.0000 g | Freq: Once | INTRAVENOUS | Status: AC
  Administered 2020-03-03: 2 g via INTRAVENOUS
  Filled 2020-03-03: qty 2

## 2020-03-03 MED ORDER — ONDANSETRON HCL 4 MG/2ML IJ SOLN: 4.0000 mg | Freq: Four times a day (QID) | INTRAMUSCULAR | Status: DC | PRN

## 2020-03-03 MED ORDER — CHLORHEXIDINE GLUCONATE CLOTH 2 % EX PADS
6.0000 | MEDICATED_PAD | Freq: Every day | CUTANEOUS | Status: DC
  Administered 2020-03-04 – 2020-03-10 (×6): 6 via TOPICAL

## 2020-03-03 MED ORDER — AMIODARONE LOAD VIA INFUSION
150.0000 mg | Freq: Once | INTRAVENOUS | Status: AC
  Administered 2020-03-03: 150 mg via INTRAVENOUS
  Filled 2020-03-03: qty 83.34

## 2020-03-03 MED ORDER — DEXAMETHASONE SODIUM PHOSPHATE 10 MG/ML IJ SOLN
6.0000 mg | INTRAMUSCULAR | Status: DC
  Administered 2020-03-03 – 2020-03-07 (×5): 6 mg via INTRAVENOUS
  Filled 2020-03-03: qty 1
  Filled 2020-03-03 (×3): qty 0.6
  Filled 2020-03-03: qty 1
  Filled 2020-03-03 (×2): qty 0.6

## 2020-03-03 MED ORDER — DILTIAZEM HCL-DEXTROSE 125-5 MG/125ML-% IV SOLN (PREMIX): 5.0000 mg/h | INTRAVENOUS | Status: DC

## 2020-03-03 MED ORDER — VANCOMYCIN HCL IN DEXTROSE 1-5 GM/200ML-% IV SOLN
1000.0000 mg | INTRAVENOUS | Status: DC
  Filled 2020-03-03: qty 200

## 2020-03-03 MED ORDER — ASCORBIC ACID 500 MG PO TABS
500.0000 mg | ORAL_TABLET | Freq: Every day | ORAL | Status: DC
  Administered 2020-03-03 – 2020-03-10 (×8): 500 mg via ORAL
  Filled 2020-03-03 (×9): qty 1

## 2020-03-03 MED ORDER — ALBUTEROL SULFATE HFA 108 (90 BASE) MCG/ACT IN AERS
2.0000 | INHALATION_SPRAY | RESPIRATORY_TRACT | Status: DC | PRN
  Filled 2020-03-03: qty 6.7

## 2020-03-03 MED ORDER — VANCOMYCIN HCL 1500 MG/300ML IV SOLN
1500.0000 mg | Freq: Once | INTRAVENOUS | Status: DC
  Filled 2020-03-03: qty 300

## 2020-03-03 MED ORDER — CHLORHEXIDINE GLUCONATE 0.12 % MT SOLN
15.0000 mL | Freq: Two times a day (BID) | OROMUCOSAL | Status: DC
  Administered 2020-03-03 – 2020-03-10 (×13): 15 mL via OROMUCOSAL
  Filled 2020-03-03 (×9): qty 15

## 2020-03-03 MED ORDER — AMIODARONE HCL IN DEXTROSE 360-4.14 MG/200ML-% IV SOLN
30.0000 mg/h | INTRAVENOUS | Status: DC
  Administered 2020-03-03 – 2020-03-05 (×5): 30 mg/h via INTRAVENOUS
  Filled 2020-03-03 (×5): qty 200

## 2020-03-03 MED ORDER — VANCOMYCIN HCL IN DEXTROSE 1-5 GM/200ML-% IV SOLN
1000.0000 mg | Freq: Once | INTRAVENOUS | Status: AC
  Administered 2020-03-03: 1000 mg via INTRAVENOUS
  Filled 2020-03-03: qty 200

## 2020-03-03 MED ORDER — NOREPINEPHRINE 4 MG/250ML-% IV SOLN
0.0000 ug/min | INTRAVENOUS | Status: DC
  Administered 2020-03-03: 11 ug/min via INTRAVENOUS
  Filled 2020-03-03: qty 250

## 2020-03-03 MED ORDER — GUAIFENESIN-DM 100-10 MG/5ML PO SYRP
15.0000 mL | ORAL_SOLUTION | Freq: Four times a day (QID) | ORAL | Status: DC | PRN
  Administered 2020-03-08: 15 mL via ORAL
  Filled 2020-03-03: qty 15

## 2020-03-03 MED ORDER — NOREPINEPHRINE 4 MG/250ML-% IV SOLN
1.0000 ug/min | INTRAVENOUS | Status: DC
  Administered 2020-03-03: 1 ug/min via INTRAVENOUS
  Filled 2020-03-03: qty 250

## 2020-03-03 MED ORDER — MELATONIN 5 MG PO TABS
5.0000 mg | ORAL_TABLET | Freq: Every evening | ORAL | Status: DC | PRN
  Administered 2020-03-03 – 2020-03-08 (×6): 5 mg via ORAL
  Filled 2020-03-03 (×7): qty 1

## 2020-03-03 MED ORDER — DILTIAZEM LOAD VIA INFUSION
10.0000 mg | Freq: Once | INTRAVENOUS | Status: DC
  Filled 2020-03-03: qty 10

## 2020-03-03 MED ORDER — BENZONATATE 100 MG PO CAPS: 200.0000 mg | ORAL_CAPSULE | Freq: Two times a day (BID) | ORAL | Status: DC | PRN

## 2020-03-03 MED ORDER — AMIODARONE LOAD VIA INFUSION
150.0000 mg | Freq: Once | INTRAVENOUS | Status: DC
  Filled 2020-03-03: qty 83.34

## 2020-03-03 MED ORDER — DOCUSATE SODIUM 100 MG PO CAPS: 100.0000 mg | ORAL_CAPSULE | Freq: Two times a day (BID) | ORAL | Status: DC | PRN

## 2020-03-03 MED ORDER — HEPARIN (PORCINE) 25000 UT/250ML-% IV SOLN
1600.0000 [IU]/h | INTRAVENOUS | Status: DC
  Administered 2020-03-03 (×2): 1800 [IU]/h via INTRAVENOUS
  Administered 2020-03-04: 1600 [IU]/h via INTRAVENOUS
  Filled 2020-03-03 (×3): qty 250

## 2020-03-03 MED ORDER — ONDANSETRON HCL 4 MG/2ML IJ SOLN
INTRAMUSCULAR | Status: AC
  Administered 2020-03-03: 4 mg via INTRAVENOUS
  Filled 2020-03-03: qty 2

## 2020-03-03 MED ORDER — POLYETHYLENE GLYCOL 3350 17 G PO PACK: 17.0000 g | PACK | Freq: Every day | ORAL | Status: DC | PRN

## 2020-03-03 MED ORDER — INSULIN ASPART 100 UNIT/ML ~~LOC~~ SOLN
0.0000 [IU] | SUBCUTANEOUS | Status: DC
  Administered 2020-03-03: 3 [IU] via SUBCUTANEOUS
  Administered 2020-03-03: 5 [IU] via SUBCUTANEOUS
  Administered 2020-03-04 (×3): 2 [IU] via SUBCUTANEOUS
  Filled 2020-03-03 (×5): qty 1

## 2020-03-03 MED ORDER — ORAL CARE MOUTH RINSE
15.0000 mL | Freq: Two times a day (BID) | OROMUCOSAL | Status: DC
  Administered 2020-03-03 – 2020-03-09 (×9): 15 mL via OROMUCOSAL

## 2020-03-03 NOTE — Progress Notes (Signed)
ANTICOAGULATION CONSULT NOTE  Pharmacy Consult for heparin infusion Indication: chest pain/ACS and presumed pulmonary embolus  Patient Measurements: Height: 6\' 2"  (188 cm) Weight: 108.9 kg (240 lb) IBW/kg (Calculated) : 82.2 Heparin Dosing Weight: 104.6  Vital Signs: BP: 80/64 (12/09 1300) Pulse Rate: 65 (12/09 1300)  Labs: Recent Labs    03/03/20 0758 03/03/20 0900 03/03/20 1027  HGB 14.3  --   --   HCT 43.3  --   --   PLT 265  --   --   CREATININE  --  2.19*  --   TROPONINIHS  --  1,064* 1,145*    Estimated Creatinine Clearance: 43 mL/min (A) (by C-G formula based on SCr of 2.19 mg/dL (H)).   Medical History: Past Medical History:  Diagnosis Date  . Hypertension     Assessment: 69 y.o. Male recently admitted from 12/1 to 12/6 for COVID-19 Pneumonia, now presents with Acute Hypoxic Respiratory Failure and Septic Shock due to suspected Healthcare Associated Pneumonia and presumed Pulmonary Embolus. According to the completed Med Rec he is on no chronic anticoacoagulation except aspirin. Baseline H&H, platelets wnl, other labs ordered but not yet resulted.  Goal of Therapy:  Heparin level 0.3-0.7 units/ml Monitor platelets by anticoagulation protocol: Yes   Plan:  Give 7500 units bolus x 1 Start heparin infusion at 1800 units/hr Check anti-Xa level in 6 hours and daily while on heparin Continue to monitor H&H and platelets  14/6 03/03/2020,1:14 PM

## 2020-03-03 NOTE — ED Notes (Signed)
Date and time results received: 03/03/20 1105 (use smartphrase ".now" to insert current time)  Test: Lactic acid Critical Value: 4.6  Name of Provider Notified: Vicente Males, MD  Orders Received? Or Actions Taken?: Orders Received - See Orders for details

## 2020-03-03 NOTE — ED Notes (Signed)
Accepting RN is busy with another pt - ICU secretary states RN will call when available

## 2020-03-03 NOTE — ED Notes (Signed)
Date and time results received: 03/03/20 0935  Test: troponin Critical Value: 1064  Name of Provider Notified: Vicente Males, MD  Orders Received? Or Actions Taken?: Orders Received - See Orders for details

## 2020-03-03 NOTE — H&P (Signed)
NAME:  Paul Mitchell, MRN:  [id number], DOB:  Sep 03, 1950, LOS: 0 ADMISSION DATE:  03/03/2020, CONSULTATION DATE:  03/03/2020 REFERRING MD:  Dr. Cheri Fowler, CHIEF COMPLAINT:  Acute Respiratory Distress   Brief History   69 y.o. Male recently admitted from 02/24/20 to 02/29/20 for COVID-19  Pneumonia, now presents with Acute Hypoxic Respiratory Failure and Septic Shock due to COVID-19 Pneumonia, suspected Healthcare Associated Pneumonia, along with presumed Pulmonary Embolus and new onset Atrial Fibrillation with RVR.  History of present illness   Paul Mitchell is a 69 y.o. Male with a past medical history significant for hypertension and recent COVID-19 diagnosis on 02/22/2020 requiring hospitalization from 02/24/2020 to 02/29/2020 and discharged on 4 L of oxygen via nasal cannula,  who presents to [facility] ED on 03/03/2020 due to acute respiratory distress.  He reports he has become increasingly short of breath beginning last night.  Upon EMS arrival he was found to be severely hypoxic with O2 saturations in the low 70s of which he was placed on nonrebreather mask with mild improvement.  The patient endorses palpitations and some mild chest tightness in addition to shortness of breath.  He denies fever, chills, abdominal pain, nausea, vomiting, diarrhea, dysuria.  Upon presentation to the ED vitals included: RR 20, HR 133 (atrial fibrillation), BP 75/52, and 92% SpO2 on nonrebreather mask.  EKG with Atrial Fibrillation with RVR, but no evidence of acute ischemia.  Initial work-up in the ED revealed WBC 17.8, lactic acid 4.6, BUN 34, creatinine 2.19, glucose 228, BNP 253, high-sensitivity troponin 1064, and FDP's 7453.  His repeat COVID-19 PCR is positive.  Chest x-ray shows mildly decreasing bilateral lung opacities consistent with slightly improving multifocal pneumonia.  He met sepsis criteria therefore he was given IV fluid resuscitation and broad-spectrum antibiotics with cefepime and vancomycin.  Despite IV  fluid resuscitation, he remained hypotensive requiring Levophed infusion.  He was also placed on amiodarone infusion due to atrial fibrillation with RVR.  PCCM is asked to admit the patient to ICU for further work-up and treatment of acute hypoxic respiratory failure and septic shock  in the setting of COVID-19 pneumonia, suspected superimposed healthcare associated pneumonia, and presumed pulmonary embolus.   Past Medical History  Hypertension Recent COVID-19 Pneumonia (diagnosed 02/22/20, hospitalized 02/24/20 to 02/29/20)   Significant Hospital Events   12/9: PCCM asked to admit to ICU  Consults:  PCCM Cardiology  Procedures:  N/A  Significant Diagnostic Tests:  12/9: Venous US BLE>> 12/9: 2D Echocardiogram>>  Micro Data:  12/9: COVID-19 PCR>> positive 12/9: Influenza PCR>> negative 12/9: MRSA PCR>> 12/9: Blood culture x2>> 12/9: Sputum>> 12/9: Strep pneumo urinary antigen>> 12/9: Legionella urinary antigen>>  Antimicrobials:  Cefepime 12/9>> Vancomycin 12/9>>  Interim history/subjective:  Currently on NRB  Requiring Levophed (currently at 12 mcg) infusion for BP support Awake and alert Complains of Shortness of Breath (improved since arrival to ED), chest tightness, and intermittent nonproductive cough Denies fever/chills, wheezing, abdominal pain, N/V/D, dysuria, leg swelling, pain, or erythema  Objective   Blood pressure (!) 80/64, pulse 65, resp. rate (!) 26, height _0  (1.88 m), weight 108.9 kg, SpO2 98 %.        Intake/Output Summary (Last 24 hours) at 03/03/2020 1313 Last data filed at 03/03/2020 1300 Gross per 24 hour  Intake 2105.49 ml  Output --  Net 2105.49 ml   Filed Weights   03/03/20 0746  Weight: 108.9 kg    Examination: General: Acutely ill appearing male, sitting in bed, on NRB mask,  in NAD HENT: Atraumatic, normocephalic, neck supple, no JVD Lungs: Unable to auscultate due to CAPR, mild tachypnea, even Cardiovascular: Regular rate  and rhythm, 1+ distal pulses Abdomen: Soft, nontender, nondistended, no guarding or rebound tenderness Extremities: Normal bulk and tone, no deformities, no edema, negative Homan's sign Neuro: Awake, A&O x4, follows commands, no focal deficits, speech clear Skin: Warm and dry.  No obvious rashes, lesions, or ulcerations  Resolved Hospital Problem list   N/A  Assessment & Plan:   Acute Hypoxic Respiratory Failure in the setting of COVID-19 Pneumonia, suspected superimposed Healthcare Associated Pneumonia, and presumed Pulmonary Embolus -Supplemental O2 as needed to maintain O2 sats >88% -High risk for intubation -Follow intermittent CXR & ABG as needed -Placed on Cefepime and Vancomycin -Treating for presumed PE with Heparin gtt, unable to obtain CTA Chest at this time due to AKI, will obtain Venous US of BLE -Prn Bronchodilators via MDI -Antitussives -Incentive spirometry -Self-proning as tolerated -Maintain Euvovolemia to net negative fluid balance as able   Septic Shock Elevated Troponin, demand ischemia vs. NSTEMI Atrial Fibrillation w/ RVR>>has converted to NSR -Continous cardiac monitoring -Maintain MAP >65 -Received IV fluid resuscitation in ED -Vasopressors if needed to maintain MAP goal -Trend lactic acid -Trend Troponin -Heparin gtt -Amiodarone gtt -Cardiology consulted, appreciate input -Obtain 2D Echocardiogram   Severe Sepsis secondary to COVID-19 Pneumonia and Suspected superimposed Bacterial Pneumonia -Monitor fever curve -Trend WBC's and Procalcitonin -Follow cultures as above -Placed on Cefepime and Vancomycin for now pending cultures and sensitivity -Previously completed course of Remdesivir and 5 day course of Baricitinib during his previous hospital admission from 02/24/20 to 02/29/20 -IV Steroids -Follow inflammatory markers: Ferritin, D-dimer, CRP, IL-6, LDH -Vitamin C, zinc -Maintain Airborne and contact precautions   AKI -Monitor I&O's /  urinary output -Follow BMP -Ensure adequate renal perfusion -Avoid nephrotoxic agents as able -Replace electrolytes as indicated -IV Fluids   Hyperglycemia -CBG's -SSI -Follow ICU Hypo/hyperglycemia protocol    Best practice (evaluated daily)   Diet: NPO pending improvement in respiratory status, then can advance as tolerated Pain/Anxiety/Delirium protocol (if indicated): N/A VAP protocol (if indicated): N/A DVT prophylaxis: Heparin gtt GI prophylaxis: N/A Glucose control: SSI Mobility: As tolerated last date of multidisciplinary goals of care discussion: N/A Family and staff present: Updated pt and RN at bedside.  Called and updated his wife via telephone 03/03/20. Summary of discussion: Treating for septic shock, bacterial PNA and PE with ABX, Heparin, and Vasopressors Follow up goals of care discussion due: 03/04/2020 Code Status: Full Code Disposition: ICU  Labs   CBC: Recent Labs  Lab 02/27/20 0424 02/28/20 0415 02/29/20 0431 03/03/20 0758  WBC 8.7 6.5 8.7 17.8*  NEUTROABS 6.7 4.7 5.5  --   HGB 12.4* 11.9* 12.0* 14.3  HCT 36.6* 34.4* 35.3* 43.3  MCV 88.8 88.9 89.4 92.5  PLT 258 233 237 196    Basic Metabolic Panel: Recent Labs  Lab 02/27/20 0424 02/28/20 0415 02/29/20 0431 03/03/20 0900  NA 138 136 137 135  K 4.0 4.6 4.3 4.6  CL 103 103 104 100  CO2 _0 21*  GLUCOSE 175* 165* 100* 228*  BUN 43* 38* 31* 34*  CREATININE 1.24 1.12 1.21 2.19*  CALCIUM 7.9* 7.9* 8.0* 8.3*  MG  --   --   --  2.4   GFR: Estimated Creatinine Clearance: 43 mL/min (A) (by C-G formula based on SCr of 2.19 mg/dL (H)). Recent Labs  Lab 02/27/20 0424 02/28/20 0415 02/29/20 0431 03/03/20 0758 03/03/20 1036  WBC 8.7 6.5 8.7 17.8*  --   LATICACIDVEN  --   --   --   --  4.6*    Liver Function Tests: No results for input(s): AST, ALT, ALKPHOS, BILITOT, PROT, ALBUMIN in the last 168 hours. No results for input(s): LIPASE, AMYLASE in the last 168 hours. No results  for input(s): AMMONIA in the last 168 hours.  ABG No results found for: PHART, PCO2ART, PO2ART, HCO3, TCO2, ACIDBASEDEF, O2SAT   Coagulation Profile: No results for input(s): INR, PROTIME in the last 168 hours.  Cardiac Enzymes: No results for input(s): CKTOTAL, CKMB, CKMBINDEX, TROPONINI in the last 168 hours.  HbA1C: No results found for: HGBA1C  CBG: No results for input(s): GLUCAP in the last 168 hours.  Review of Systems:   Positives in BOLD: Gen: Denies fever, chills, weight change, fatigue, night sweats HEENT: Denies blurred vision, double vision, hearing loss, tinnitus, sinus congestion, rhinorrhea, sore throat, neck stiffness, dysphagia PULM: Denies +shortness of breath, +cough, sputum production, hemoptysis, wheezing CV: Denies +chest tightness, edema, orthopnea, paroxysmal nocturnal dyspnea, palpitations GI: Denies abdominal pain, nausea, vomiting, diarrhea, hematochezia, melena, constipation, change in bowel habits GU: Denies dysuria, hematuria, polyuria, oliguria, urethral discharge Endocrine: Denies hot or cold intolerance, polyuria, polyphagia or appetite change Derm: Denies rash, dry skin, scaling or peeling skin change Heme: Denies easy bruising, bleeding, bleeding gums Neuro: Denies headache, numbness, weakness, slurred speech, loss of memory or consciousness   Past Medical History  He,  has a past medical history of Hypertension.   Surgical History   History reviewed. No pertinent surgical history.   Social History   reports that he has never smoked. He has never used smokeless tobacco. He reports previous alcohol use. He reports previous drug use.   Family History   His family history is not on file.   Allergies Allergies  Allergen Reactions  . Enalapril Cough     Home Medications  Prior to Admission medications   Medication Sig Start Date End Date Taking? Authorizing Provider  albuterol (VENTOLIN HFA) 108 (90 Base) MCG/ACT inhaler Inhale 2  puffs into the lungs every 6 (six) hours as needed for wheezing or shortness of breath. 02/29/20  Yes Sreenath, Sudheer B, MD  aspirin EC 81 MG tablet Take 81 mg by mouth daily.   Yes [provider]  benzonatate (TESSALON PERLES) 100 MG capsule Take 1 capsule (100 mg total) by mouth 3 (three) times daily as needed for cough. 02/29/20 02/28/21 Yes Sreenath, Sudheer B, MD  dexamethasone (DECADRON) 6 MG tablet Take 1 tablet (6 mg total) by mouth daily for 5 days. 02/29/20 03/05/20 Yes Sreenath, Sudheer B, MD  furosemide (LASIX) 20 MG tablet Take 20 mg by mouth daily. 12/26/19  Yes [provider]  guaiFENesin-dextromethorphan (ROBITUSSIN DM) 100-10 MG/5ML syrup Take 10 mLs by mouth every 4 (four) hours as needed for cough. 02/29/20  Yes Sreenath, Sudheer B, MD  hydrochlorothiazide (MICROZIDE) 12.5 MG capsule Take 12.5 mg by mouth every morning. 04/04/18  Yes [provider]  losartan (COZAAR) 100 MG tablet Take 100 mg by mouth daily. 04/04/18  Yes [provider]     Critical care time: 50 minutes       Darel Hong, [facility] Pager: [phone]

## 2020-03-03 NOTE — ED Notes (Signed)
Radiology at bedside

## 2020-03-03 NOTE — ED Provider Notes (Signed)
[facility] Emergency Department Provider Note   ____________________________________________   None    (approximate)  I have reviewed the triage vital signs and the nursing notes.   HISTORY  Chief Complaint Respiratory Distress    HPI Paul Mitchell is a 69 y.o. male with a stated past medical history of hypertension and recent Covid diagnosis on 02/24/2020 who presents via EMS from home for worsening shortness of breath and respiratory distress.  Patient's wife called EMS after patient was having respiratory distress and use of accessory muscles.  Patient was recently discharged from the hospital on 02/29/2020 with 4 L of oxygen via nasal cannula continuously.  Since that time, patient states that his respiratory status has worsened and he has become increasingly short of breath.  The shortness of breath is worsened with exertion and partially relieved at rest or with supplemental oxygen.  EMS state they placed patient on 15 L by nonrebreather with significant improvement in patient's respiratory status.  They initially found patient with oxygen saturations in the low 70s and have been unable to get an accurate oxygen saturation however patient's clinical respiratory distress has decreased.  Patient endorses palpitations and some mild chest tightness in addition to the shortness of breath.  Patient denies fever, chills, nausea/vomiting/diarrhea, dysuria, abdominal pain         Past Medical History:  Diagnosis Date  . Hypertension     Patient Active Problem List   Diagnosis Date Noted  . Acute hypoxemic respiratory failure due to COVID-19 (HCC) 02/24/2020  . Essential hypertension 02/24/2020    History reviewed. No pertinent surgical history.  Prior to Admission medications   Medication Sig Start Date End Date Taking? Authorizing Provider  albuterol (VENTOLIN HFA) 108 (90 Base) MCG/ACT inhaler Inhale 2 puffs into the lungs every 6 (six) hours as  needed for wheezing or shortness of breath. 02/29/20  Yes Sreenath, Sudheer B, MD  aspirin EC 81 MG tablet Take 81 mg by mouth daily.   Yes [provider]  benzonatate (TESSALON PERLES) 100 MG capsule Take 1 capsule (100 mg total) by mouth 3 (three) times daily as needed for cough. 02/29/20 02/28/21 Yes Sreenath, Sudheer B, MD  dexamethasone (DECADRON) 6 MG tablet Take 1 tablet (6 mg total) by mouth daily for 5 days. 02/29/20 03/05/20 Yes Sreenath, Sudheer B, MD  furosemide (LASIX) 20 MG tablet Take 20 mg by mouth daily. 12/26/19  Yes [provider]  guaiFENesin-dextromethorphan (ROBITUSSIN DM) 100-10 MG/5ML syrup Take 10 mLs by mouth every 4 (four) hours as needed for cough. 02/29/20  Yes Sreenath, Sudheer B, MD  hydrochlorothiazide (MICROZIDE) 12.5 MG capsule Take 12.5 mg by mouth every morning. 04/04/18  Yes [provider]  losartan (COZAAR) 100 MG tablet Take 100 mg by mouth daily. 04/04/18  Yes [provider]    Allergies Enalapril  History reviewed. No pertinent family history.  Social History Social History   Tobacco Use  . Smoking status: Never Smoker  . Smokeless tobacco: Never Used  Substance Use Topics  . Alcohol use: Not Currently  . Drug use: Not Currently    Review of Systems Constitutional: No fever/chills Eyes: No visual changes. ENT: No sore throat. Cardiovascular: Endorses chest pain. Respiratory: Endorses shortness of breath. Gastrointestinal: No abdominal pain.  No nausea, no vomiting.  No diarrhea. Genitourinary: Negative for dysuria. Musculoskeletal: Negative for acute arthralgias Skin: Negative for rash. Neurological: Negative for headaches, weakness/numbness/paresthesias in any extremity Psychiatric: Negative for suicidal ideation/homicidal ideation  ____________________________________________   PHYSICAL EXAM:  VITAL SIGNS: ED Triage Vitals  Enc Vitals Group     BP      Pulse      Resp      Temp      Temp  src      SpO2      Weight      Height      Head Circumference      Peak Flow      Pain Score      Pain Loc      Pain Edu?      Excl. in GC?    Constitutional: Alert and oriented. Well appearing and in no acute distress. Eyes: Conjunctivae are normal. PERRL. Head: Atraumatic. Nose: No congestion/rhinnorhea. Mouth/Throat: Mucous membranes are moist. Neck: No stridor Cardiovascular: Grossly normal heart sounds.  Good peripheral circulation. Respiratory: Tachypnea.  Use of accessory muscles.  Clear to auscultation bilaterally.  15 L nonrebreather in place Gastrointestinal: Soft and nontender. No distention. Musculoskeletal: No obvious deformities Neurologic:  Normal speech and language. No gross focal neurologic deficits are appreciated. Skin:  Skin is warm and dry. No rash noted. Psychiatric: Mood and affect are normal. Speech and behavior are normal.  ____________________________________________   LABS (all labs ordered are listed, but only abnormal results are displayed)  Labs Reviewed  RESP PANEL BY RT-PCR (FLU A&B, COVID) ARPGX2 - Abnormal; Notable for the following components:      Result Value   SARS Coronavirus 2 by RT PCR POSITIVE (*)    All other components within normal limits  CBC - Abnormal; Notable for the following components:   WBC 17.8 (*)    All other components within normal limits  BASIC METABOLIC PANEL - Abnormal; Notable for the following components:   CO2 21 (*)    Glucose, Bld 228 (*)    BUN 34 (*)    Creatinine, Ser 2.19 (*)    Calcium 8.3 (*)    GFR, Estimated 32 (*)    All other components within normal limits  BRAIN NATRIURETIC PEPTIDE - Abnormal; Notable for the following components:   B Natriuretic Peptide 253.8 (*)    All other components within normal limits  FIBRIN DERIVATIVES D-DIMER ([facility] ONLY) - Abnormal; Notable for the following components:   Fibrin derivatives D-dimer [facility]) 4,081.44 (*)    All other components within normal limits   LACTIC ACID, PLASMA - Abnormal; Notable for the following components:   Lactic Acid, Venous 4.6 (*)    All other components within normal limits  TROPONIN I (HIGH SENSITIVITY) - Abnormal; Notable for the following components:   Troponin I (High Sensitivity) 1,064 (*)    All other components within normal limits  TROPONIN I (HIGH SENSITIVITY) - Abnormal; Notable for the following components:   Troponin I (High Sensitivity) 1,145 (*)    All other components within normal limits  CULTURE, BLOOD (ROUTINE X 2)  CULTURE, BLOOD (ROUTINE X 2)  EXPECTORATED SPUTUM ASSESSMENT W REFEX TO RESP CULTURE  MAGNESIUM  LACTIC ACID, PLASMA  PROCALCITONIN  CBC WITH DIFFERENTIAL/PLATELET  STREP PNEUMONIAE URINARY ANTIGEN  LEGIONELLA PNEUMOPHILA SEROGP 1 UR AG  C-REACTIVE PROTEIN  FERRITIN   ____________________________________________  EKG  ED ECG REPORT I, Merwyn Katos, the attending physician, personally viewed and interpreted this ECG.  Date: 03/03/2020 EKG Time: 0759 Rate: 123 Rhythm: Atrial fibrillation with rapid ventricular response QRS Axis: normal Intervals: normal ST/T Wave abnormalities: normal Narrative Interpretation: no evidence of acute ischemia  ____________________________________________  RADIOLOGY  ED MD interpretation: Single one-view portable x-ray of the chest shows mild decrease in bilateral lung opacities however still showing multifocal pneumonia.  No evidence of pneumothorax or widened mediastinum  Official radiology report(s): DG Chest 1 View  Result Date: 03/03/2020 CLINICAL DATA:  Shortness of breath. EXAM: CHEST  1 VIEW COMPARISON:  February 24, 2020. FINDINGS: Stable cardiomediastinal silhouette. No pneumothorax or pleural effusion is noted. Mildly decreased bilateral lung opacities are noted suggesting improving multifocal pneumonia. Bony thorax is unremarkable. IMPRESSION: Mildly decreased bilateral lung opacities are noted suggesting improving multifocal  pneumonia. Aortic Atherosclerosis (ICD10-I70.0). Electronically Signed   By: Lupita Raider M.D.   On: 03/03/2020 08:28    ____________________________________________   PROCEDURES  Procedure(s) performed (including Critical Care):  .Critical Care Performed by: Merwyn Katos, MD Authorized by: Merwyn Katos, MD   Critical care provider statement:    Critical care time (minutes):  51   Critical care time was exclusive of:  Separately billable procedures and treating other patients   Critical care was necessary to treat or prevent imminent or life-threatening deterioration of the following conditions:  Sepsis and circulatory failure   Critical care was time spent personally by me on the following activities:  Discussions with consultants, evaluation of patient's response to treatment, examination of patient, ordering and performing treatments and interventions, ordering and review of laboratory studies, ordering and review of radiographic studies, pulse oximetry, re-evaluation of patient's condition, obtaining history from patient or surrogate and review of old charts   I assumed direction of critical care for this patient from another provider in my specialty: no   .1-3 Lead EKG Interpretation Performed by: Merwyn Katos, MD Authorized by: Merwyn Katos, MD     Interpretation: abnormal     ECG rate:  98   ECG rate assessment: normal     Rhythm: atrial fibrillation     Ectopy: none     Conduction: normal       ____________________________________________   INITIAL IMPRESSION / ASSESSMENT AND PLAN / ED COURSE  As part of my medical decision making, I reviewed the following data within the electronic MEDICAL RECORD NUMBER Nursing notes reviewed and incorporated, Labs reviewed, EKG interpreted, Old chart reviewed, Radiograph reviewed and Notes from prior ED visits reviewed and incorporated        Patient is a 69 year old male with the above-stated past medical history the  presents via EMS for worsening respiratory status as well as new onset A. fib with RVR Differential diagnosis for this patient includes but is not limited to.  Atrial fibrillation with rapid ventricular response, sepsis, pneumonia, pulmonary edema, acute on chronic heart failure Laboratory evaluation significant for leukocytosis to 17, dimer elevation 7453,  troponin elevation 1145, BNP 253, creatinine 2.19 that are all significantly abnormal from patient's baseline Patient also found to have lactic acidosis of 4.6 Patient empirically treated for sepsis with 30 cc/kg bolus of NS, empiric antibiotics, blood cultures, and repeat lactic acid Despite 30 cc/kg fluid bolus, patient remained hypotensive and was started on norepinephrine with improvement in his blood pressure significantly I spoke to Dr. Belia Heman in critical care medicine who agrees to accept this patient to his service for further evaluation and management.      ____________________________________________   FINAL CLINICAL IMPRESSION(S) / ED DIAGNOSES  Final diagnoses:  Acute on chronic respiratory failure with hypoxia (HCC)  Atrial fibrillation with rapid ventricular response (HCC)  Sepsis, due to unspecified organism, unspecified whether acute organ  dysfunction present [facility](HCC)     ED Discharge Orders         Ordered    Amb referral to AFIB Clinic        03/03/20 0742           Note:  This document was prepared using Dragon voice recognition software and may include unintentional dictation errors.   Merwyn KatosBradler, Nochum Fenter K, MD 03/03/20 [phone]

## 2020-03-03 NOTE — Plan of Care (Signed)
Pt admitted today from ED for reoccuring SOB after recent discharge on 12/6. Now in ICU, on levophed, heparin, amio gtt for new afib with hypotension. Central line placement now at bedside. Pt on 10L humified [place]. Foley placed for inability to void along with critical condition requiring pressors. Pt is calm and cooperative and able to participate in care.

## 2020-03-03 NOTE — Progress Notes (Signed)
Pharmacy Antibiotic Note  Paul Mitchell is a 69 y.o. Male recently admitted from 12/1 to 12/6 for COVID-19 Pneumonia, now presents with Acute Hypoxic Respiratory Failure and Septic Shock due to suspected Healthcare Associated Pneumonia and presumed Pulmonary Embolus. Pharmacy has been consulted for cefepime and vancomycin dosing. In the ED he received 1000 mg IV vancomycin and 2 grams IV cefepime. His renal function on admission is significantly impaired compared to his apparent baseline level.  Plan:  1) vancomycin   Add an additional 1000 mg IV vancomycin (total loading dose 2000 mg)  Start vancomycin 1000 mg IV Q 24 hrs   Ke: 0.036 h-1, T1/2: 19.3 h  Css (calculated): 31.8/13.9 mcg/mL  Daily SCr to assess renal function while on vancomycin  Levels as clinically indicated   2) start cefepime 2 grams IV every 12 hours  Height: 6\' 2"  (188 cm) Weight: 108.9 kg (240 lb) IBW/kg (Calculated) : 82.2  No data recorded.  Recent Labs  Lab 02/27/20 0424 02/28/20 0415 02/29/20 0431 03/03/20 0758 03/03/20 0900 03/03/20 1036 03/03/20 1250 03/03/20 1304  WBC 8.7 6.5 8.7 17.8*  --   --   --  17.8*  CREATININE 1.24 1.12 1.21  --  2.19*  --   --   --   LATICACIDVEN  --   --   --   --   --  4.6* 2.8*  --     Estimated Creatinine Clearance: 43 mL/min (A) (by C-G formula based on SCr of 2.19 mg/dL (H)).    Allergies  Allergen Reactions  . Enalapril Cough    Antimicrobials this admission: vancomycin 12/9 >>  cefepime 12/9 >>   Microbiology results: 12/9 BCx: pending 12/9 SARS CoV-2: positive 12/9 influenza A/B: negative 12/9 Sputum: pending  12/9 MRSA PCR: pending  Thank you for allowing pharmacy to be a part of this patient's care.  14/9 03/03/2020 1:28 PM

## 2020-03-03 NOTE — Consult Note (Signed)
CARDIOLOGY CONSULT NOTE               Patient ID: REDFORD BEHRLE MRN: [id number] DOB/AGE: 1950/07/23 69 y.o.  Admit date: 03/03/2020 Referring Physician Darel Hong, NP Primary Physician [facility] Primary Cardiologist Nehemiah Massed Reason for Consultation atrial fibrillation, elevated troponin  HPI: 69 year old male referred for evaluation of atrial fibrillation with RVR and elevated troponin.  The patient has a history of essential hypertension.  He was recently admitted 12/1-12/08/2019 for hypoxic respiratory failure secondary to multifocal pneumonia secondary to COVID-19.  The patient returned to [facility] ER today, 3 days post discharge, for worsening exertional shortness of breath.  Per the patient's wife, the patient was noted to be in respiratory distress with use of respiratory accessory muscles.  Upon arrival, the patient was on 15 L of supplemental O2 by nonrebreather with improvement of symptoms.  Chest x-ray showed mildly decreased bilateral lung opacities, suggesting improving multifocal pneumonia.  ECG revealed atrial fibrillation with a rapid ventricular rate of 123 bpm without evidence of ischemia.  Admission labs notable for high-sensitivity troponin 1064 followed by 1145, lactic acid 4.6, WBC 17.8 (compared to 8.7 three days ago), BNP 253.  The patient met sepsis criteria, with started on IV antibiotics, and due to hypotension, was started on vasopressors.  The patient was started on heparin and amiodarone drips.  Currently, the patient denies chest pain, palpitations, or recent peripheral edema.  He states that he had mild chest pain this morning described as "soreness."  Review of systems complete and found to be negative unless listed above     Past Medical History:  Diagnosis Date  . Hypertension     History reviewed. No pertinent surgical history.  (Not in a hospital admission)  Social History   Socioeconomic History  . Marital status: Married    Spouse name: Not on  file  . Number of children: Not on file  . Years of education: Not on file  . Highest education level: Not on file  Occupational History  . Not on file  Tobacco Use  . Smoking status: Never Smoker  . Smokeless tobacco: Never Used  Substance and Sexual Activity  . Alcohol use: Not Currently  . Drug use: Not Currently  . Sexual activity: Not on file  Other Topics Concern  . Not on file  Social History Narrative  . Not on file   Social Determinants of Health   Financial Resource Strain: Not on file  Food Insecurity: Not on file  Transportation Needs: Not on file  Physical Activity: Not on file  Stress: Not on file  Social Connections: Not on file  Intimate Partner Violence: Not on file    History reviewed. No pertinent family history.    Review of systems complete and found to be negative unless listed above      PHYSICAL EXAM  General: Ill-appearing, on supplemental oxygen via nonrebreather, lying in bed HEENT:  Normocephalic and atramatic Neck:  No JVD.  Lungs: Mildly increased effort of breathing on supplemental oxygen via nonrebreather, speaking in full sentences Heart: Irregularly irregular, without gallops or murmurs.  Abdomen: Nondistended Msk: No obvious deformities Extremities: Trace pedal edema.   Neuro: Alert and oriented X 3. Psych:  Good affect, responds appropriately  Labs:   Lab Results  Component Value Date   WBC 17.8 (H) 03/03/2020   HGB 12.8 (L) 03/03/2020   HCT 38.1 (L) 03/03/2020   MCV 90.7 03/03/2020   PLT 233 03/03/2020    Recent Labs  Lab 03/03/20 0900  NA 135  K 4.6  CL 100  CO2 21*  BUN 34*  CREATININE 2.19*  CALCIUM 8.3*  GLUCOSE 228*   No results found for: CKTOTAL, CKMB, CKMBINDEX, TROPONINI No results found for: CHOL No results found for: HDL No results found for: LDLCALC No results found for: TRIG No results found for: CHOLHDL No results found for: LDLDIRECT    Radiology: DG Chest 1 View  Result Date:  03/03/2020 CLINICAL DATA:  Shortness of breath. EXAM: CHEST  1 VIEW COMPARISON:  February 24, 2020. FINDINGS: Stable cardiomediastinal silhouette. No pneumothorax or pleural effusion is noted. Mildly decreased bilateral lung opacities are noted suggesting improving multifocal pneumonia. Bony thorax is unremarkable. IMPRESSION: Mildly decreased bilateral lung opacities are noted suggesting improving multifocal pneumonia. Aortic Atherosclerosis (ICD10-I70.0). Electronically Signed   By: Marijo Conception M.D.   On: 03/03/2020 08:28   DG Chest 2 View  Result Date: 02/19/2020 CLINICAL DATA:  Shortness of breath. EXAM: CHEST - 2 VIEW COMPARISON:  CT 12/01/2008. FINDINGS: Mediastinum hilar structures normal. Cardiomegaly. No pulmonary venous congestion. Bilateral pulmonary infiltrates/edema. No pleural effusion or pneumothorax. Degenerative change thoracic spine. IMPRESSION: 1. Cardiomegaly. No pulmonary venous congestion. 2. Bilateral pulmonary infiltrates/edema. Electronically Signed   By: Marcello Moores  Register   On: 02/19/2020 13:26   DG Chest Port 1 View  Result Date: 02/24/2020 CLINICAL DATA:  69 year old male with shortness of breath. EXAM: PORTABLE CHEST 1 VIEW COMPARISON:  Chest radiograph dated 02/19/2020. FINDINGS: Significant interval worsening of bilateral pulmonary infiltrate compared to prior radiograph in keeping with worsening multifocal pneumonia. Clinical correlation and follow-up to resolution recommended. No pleural effusion pneumothorax. Stable cardiac silhouette. Atherosclerotic calcification of the aorta. No acute osseous pathology. IMPRESSION: Worsening multifocal pneumonia. Electronically Signed   By: Anner Crete M.D.   On: 02/24/2020 15:34    EKG: Atrial fibrillation without evidence of ischemia  ASSESSMENT AND PLAN:  1.  New onset atrial fibrillation in the setting of severe sepsis secondary to COVID-19 pneumonia and suspected superimposed bacterial pneumonia, currently rate  controlled on amiodarone drip.  Started on heparin drip due to elevated troponin 2.  Elevated troponin, likely secondary to demand supply ischemia in the setting of atrial fibrillation with RVR and septic shock.  Patient denies any significant chest pain.  Currently on heparin drip. 3.  Septic shock secondary to COVID-19 pneumonia, on vasopressors  Recommendations: 1.  Continue to trend troponin 2.  Continue heparin drip for now 3.  Review 2D echocardiogram 4.  Continue amiodarone drip 5.  Further recommendations pending patient's initial course  Signed: Sharolyn Douglas 03/03/2020, 3:32 PM

## 2020-03-03 NOTE — Procedures (Signed)
Central Venous Catheter Insertion Procedure Note  GERRON GUIDOTTI  [id number]  12/12/1950  Date:03/03/20  Time:6:44 PM   Provider Performing:Lossie Kalp D Elvina Sidle   Procedure: Insertion of Non-tunneled Central Venous [phone]) with US guidance ([id number])   Indication(s) Medication administration and Difficult access  Consent Risks of the procedure as well as the alternatives and risks of each were explained to the patient and/or caregiver.  Consent for the procedure was obtained and is signed in the bedside chart  Anesthesia Topical only with 1% lidocaine   Timeout Verified patient identification, verified procedure, site/side was marked, verified correct patient position, special equipment/implants available, medications/allergies/relevant history reviewed, required imaging and test results available.  Sterile Technique Maximal sterile technique including full sterile barrier drape, hand hygiene, sterile gown, sterile gloves, mask, hair covering, sterile ultrasound probe cover (if used).  Procedure Description Area of catheter insertion was cleaned with chlorhexidine and draped in sterile fashion.  With real-time ultrasound guidance a central venous catheter was placed into the left internal jugular vein. Nonpulsatile blood flow and easy flushing noted in all ports.  The catheter was sutured in place and sterile dressing applied.  Complications/Tolerance None; patient tolerated the procedure well. Chest X-ray is ordered to verify placement for internal jugular or subclavian cannulation.   Chest x-ray is not ordered for femoral cannulation.  EBL Minimal  Specimen(s) None   Line was secured at the 20 cm mark.  BIOPATCH applied to the insertion site.    Harlon Ditty, AGACNP-BC [facility] Pager: [phone]

## 2020-03-03 NOTE — Progress Notes (Signed)
Amiodarone Drug - Drug Interaction Consult Note  Recommendations: Amiodarone is metabolized by the cytochrome P450 system and therefore has the potential to cause many drug interactions. Amiodarone has an average plasma half-life of 50 days (range 20 to 100 days).   While no drug interactions exist with current admission orders, the patient takes furosemide at home. No specific drug interaction between amiodarone and furosemide, but diuretics like furosemide may increase the risk of hypokalemia, and amiodarone may cause cardiotoxicity in the setting of hypokalemia. Recommend continued pharmacy monitoring for drug interactions as inpatient orders change, and regular monitoring of potassium level while taking diuretics and amiodarone.   There is potential for drug interactions to occur several weeks or months after stopping treatment and the onset of drug interactions may be slow after initiating amiodarone.   []  Statins: Increased risk of myopathy. Simvastatin- restrict dose to 20mg  daily. Other statins: counsel patients to report any muscle pain or weakness immediately.  []  Anticoagulants: Amiodarone can increase anticoagulant effect. Consider warfarin dose reduction. Patients should be monitored closely and the dose of anticoagulant altered accordingly, remembering that amiodarone levels take several weeks to stabilize.  []  Antiepileptics: Amiodarone can increase plasma concentration of phenytoin, the dose should be reduced. Note that small changes in phenytoin dose can result in large changes in levels. Monitor patient and counsel on signs of toxicity.  []  Beta blockers: increased risk of bradycardia, AV block and myocardial depression. Sotalol - avoid concomitant use.  []   Calcium channel blockers (diltiazem and verapamil): increased risk of bradycardia, AV block and myocardial depression.  []   Cyclosporine: Amiodarone increases levels of cyclosporine. Reduced dose of cyclosporine is  recommended.  []  Digoxin dose should be halved when amiodarone is started.  [x]  Diuretics: increased risk of cardiotoxicity if hypokalemia occurs.  []  Oral hypoglycemic agents (glyburide, glipizide, glimepiride): increased risk of hypoglycemia. Patient's glucose levels should be monitored closely when initiating amiodarone therapy.   []  Drugs that prolong the QT interval:  Torsades de pointes risk may be increased with concurrent use - avoid if possible.  Monitor QTc, also keep magnesium/potassium WNL if concurrent therapy can't be avoided. Antibiotics: e.g. fluoroquinolones, erythromycin. . Antiarrhythmics: e.g. quinidine, procainamide, disopyramide, sotalol. . Antipsychotics: e.g. phenothiazines, haloperidol.  . Lithium, tricyclic antidepressants, and methadone.    , PharmD Clinical Pharmacist  03/03/2020   11:15 AM

## 2020-03-03 NOTE — ED Notes (Signed)
Attempted to call report - RN unable to take report at this time but will call back

## 2020-03-03 NOTE — ED Notes (Signed)
Date and time results received: 03/03/20 0956  Test: COVID test Critical Value: positive  Name of Provider Notified: Vicente Males, MD  Orders Received? Or Actions Taken?: no orders recieved.

## 2020-03-03 NOTE — ED Triage Notes (Signed)
Pt comes in via [facility] from home c/o increased SHOB and new onset a-fib RVR.  Pt recently released from the hospital for COVID.  Pt initial sats were 70's at 4L nasal cannula and HR at 160's.  Per EMS patient was using all accessory muscles for work of breathing. Pt now on 15L non-rebreather and tolerating well.

## 2020-03-04 ENCOUNTER — Inpatient Hospital Stay
Admit: 2020-03-04 | Discharge: 2020-03-04 | Disposition: A | Discharge status: Home / Self Care | Payer: Managed Care, Other (non HMO) | Attending: Pulmonary Disease | Admitting: Pulmonary Disease

## 2020-03-04 DIAGNOSIS — N179 Acute kidney failure, unspecified: Secondary | ICD-10-CM

## 2020-03-04 DIAGNOSIS — A419 Sepsis, unspecified organism: Principal | ICD-10-CM

## 2020-03-04 DIAGNOSIS — J9621 Acute and chronic respiratory failure with hypoxia: Secondary | ICD-10-CM

## 2020-03-04 DIAGNOSIS — R7989 Other specified abnormal findings of blood chemistry: Secondary | ICD-10-CM

## 2020-03-04 DIAGNOSIS — I4891 Unspecified atrial fibrillation: Secondary | ICD-10-CM

## 2020-03-04 DIAGNOSIS — I824Y2 Acute embolism and thrombosis of unspecified deep veins of left proximal lower extremity: Secondary | ICD-10-CM

## 2020-03-04 LAB — CBC WITH DIFFERENTIAL/PLATELET
Abs Immature Granulocytes: 0.15 10*3/uL — ABNORMAL HIGH (ref 0.00–0.07)
Basophils Absolute: 0 10*3/uL (ref 0.0–0.1)
Basophils Relative: 0 %
Eosinophils Absolute: 0.1 10*3/uL (ref 0.0–0.5)
Eosinophils Relative: 0 %
HCT: 40.2 % (ref 39.0–52.0)
Hemoglobin: 13.5 g/dL (ref 13.0–17.0)
Immature Granulocytes: 1 %
Lymphocytes Relative: 17 %
Lymphs Abs: 2.8 10*3/uL (ref 0.7–4.0)
MCH: 30.5 pg (ref 26.0–34.0)
MCHC: 33.6 g/dL (ref 30.0–36.0)
MCV: 91 fL (ref 80.0–100.0)
Monocytes Absolute: 2.4 10*3/uL — ABNORMAL HIGH (ref 0.1–1.0)
Monocytes Relative: 15 %
Neutro Abs: 10.9 10*3/uL — ABNORMAL HIGH (ref 1.7–7.7)
Neutrophils Relative %: 67 %
Platelets: 230 10*3/uL (ref 150–400)
RBC: 4.42 MIL/uL (ref 4.22–5.81)
RDW: 13.6 % (ref 11.5–15.5)
WBC: 16.3 10*3/uL — ABNORMAL HIGH (ref 4.0–10.5)
nRBC: 0 % (ref 0.0–0.2)

## 2020-03-04 LAB — COMPREHENSIVE METABOLIC PANEL
ALT: 48 U/L — ABNORMAL HIGH (ref 0–44)
AST: 35 U/L (ref 15–41)
Albumin: 2.6 g/dL — ABNORMAL LOW (ref 3.5–5.0)
Alkaline Phosphatase: 42 U/L (ref 38–126)
Anion gap: 10 (ref 5–15)
BUN: 34 mg/dL — ABNORMAL HIGH (ref 8–23)
CO2: 19 mmol/L — ABNORMAL LOW (ref 22–32)
Calcium: 7.9 mg/dL — ABNORMAL LOW (ref 8.9–10.3)
Chloride: 107 mmol/L (ref 98–111)
Creatinine, Ser: 1.78 mg/dL — ABNORMAL HIGH (ref 0.61–1.24)
GFR, Estimated: 41 mL/min — ABNORMAL LOW (ref >60)
Glucose, Bld: 138 mg/dL — ABNORMAL HIGH (ref 70–99)
Potassium: 4.6 mmol/L (ref 3.5–5.1)
Sodium: 136 mmol/L (ref 135–145)
Total Bilirubin: 1.1 mg/dL (ref 0.3–1.2)
Total Protein: 6 g/dL — ABNORMAL LOW (ref 6.5–8.1)

## 2020-03-04 LAB — HEPARIN LEVEL (UNFRACTIONATED)
Heparin Unfractionated: 1.06 IU/mL — ABNORMAL HIGH (ref 0.30–0.70)
Heparin Unfractionated: 0.69 IU/mL (ref 0.30–0.70)
Heparin Unfractionated: 0.71 IU/mL — ABNORMAL HIGH (ref 0.30–0.70)

## 2020-03-04 LAB — MAGNESIUM: Magnesium: 2.2 mg/dL (ref 1.7–2.4)

## 2020-03-04 LAB — ECHOCARDIOGRAM COMPLETE
AR max vel: 5.09 cm2
AV Area VTI: 4.72 cm2
AV Area mean vel: 4.59 cm2
AV Mean grad: 3 mmHg
AV Peak grad: 4 mmHg
Ao pk vel: 1 m/s
Height: 74 in
S' Lateral: 1.86 cm
Weight: 3530.89 oz

## 2020-03-04 LAB — FERRITIN: Ferritin: 775 ng/mL — ABNORMAL HIGH (ref 24–336)

## 2020-03-04 LAB — EXPECTORATED SPUTUM ASSESSMENT W GRAM STAIN, RFLX TO RESP C

## 2020-03-04 LAB — GLUCOSE, CAPILLARY
Glucose-Capillary: 118 mg/dL — ABNORMAL HIGH (ref 70–99)
Glucose-Capillary: 126 mg/dL — ABNORMAL HIGH (ref 70–99)
Glucose-Capillary: 128 mg/dL — ABNORMAL HIGH (ref 70–99)
Glucose-Capillary: 137 mg/dL — ABNORMAL HIGH (ref 70–99)
Glucose-Capillary: 150 mg/dL — ABNORMAL HIGH (ref 70–99)
Glucose-Capillary: 99 mg/dL (ref 70–99)

## 2020-03-04 LAB — LEGIONELLA PNEUMOPHILA SEROGP 1 UR AG: L. pneumophila Serogp 1 Ur Ag: NEGATIVE

## 2020-03-04 LAB — STREP PNEUMONIAE URINARY ANTIGEN: Strep Pneumo Urinary Antigen: NEGATIVE

## 2020-03-04 LAB — PROCALCITONIN: Procalcitonin: 0.18 ng/mL

## 2020-03-04 LAB — FIBRIN DERIVATIVES D-DIMER (ARMC ONLY): Fibrin derivatives D-dimer (ARMC): 4553.93 ng/mL (FEU) — ABNORMAL HIGH (ref 0.00–499.00)

## 2020-03-04 LAB — TROPONIN I (HIGH SENSITIVITY): Troponin I (High Sensitivity): 1477 ng/L (ref <18)

## 2020-03-04 LAB — PHOSPHORUS: Phosphorus: 3.6 mg/dL (ref 2.5–4.6)

## 2020-03-04 LAB — C-REACTIVE PROTEIN: CRP: 2.2 mg/dL — ABNORMAL HIGH (ref <1.0)

## 2020-03-04 MED ORDER — HEPARIN (PORCINE) 25000 UT/250ML-% IV SOLN
1300.0000 [IU]/h | INTRAVENOUS | Status: DC
  Administered 2020-03-04: 1300 [IU]/h via INTRAVENOUS
  Filled 2020-03-04: qty 250

## 2020-03-04 MED ORDER — INSULIN ASPART 100 UNIT/ML ~~LOC~~ SOLN
0.0000 [IU] | Freq: Three times a day (TID) | SUBCUTANEOUS | Status: DC
  Administered 2020-03-04 – 2020-03-10 (×10): 2 [IU] via SUBCUTANEOUS
  Filled 2020-03-04 (×10): qty 1

## 2020-03-04 NOTE — Progress Notes (Signed)
ANTICOAGULATION CONSULT NOTE  Pharmacy Consult for heparin infusion Indication: chest pain/ACS and presumed pulmonary embolus  Patient Measurements: Heparin Dosing Weight: 104.6  Vital Signs: Temp: 98.1 F (36.7 C) (12/10 0800) Temp Source: Oral (12/10 0800) BP: 98/64 (12/10 1700) Pulse Rate: 87 (12/10 1700)  Labs: Recent Labs    03/03/20 0758 03/03/20 0900 03/03/20 0900 03/03/20 1027 03/03/20 1304 03/03/20 1323 03/03/20 1357 03/03/20 2320 03/04/20 0411 03/04/20 0829 03/04/20 1652  HGB 14.3  --   --   --  12.8*  --   --   --   --  13.5  --   HCT 43.3  --   --   --  38.1*  --   --   --   --  40.2  --   PLT 265  --   --   --  233  --   --   --   --  230  --   APTT  --   --   --   --   --  30  --   --   --   --   --   LABPROT  --   --   --   --   --  14.9  --   --   --   --   --   INR  --   --   --   --   --  1.2  --   --   --   --   --   HEPARINUNFRC  --   --   --   --   --   --   --  0.85*  --  1.06* 0.69  CREATININE  --  2.19*  --   --   --   --   --   --  1.78*  --   --   TROPONINIHS  --  1,064*   < > 1,145*  --   --  1,064* 1,477*  --   --   --    < > = values in this interval not displayed.    Estimated Creatinine Clearance: 50.9 mL/min (A) (by C-G formula based on SCr of 1.78 mg/dL (H)).   Medical History: Past Medical History:  Diagnosis Date  . Hypertension     Assessment: 69 y.o. Male recently admitted from 12/1 to 12/6 for COVID-19 Pneumonia, now presents with Acute Hypoxic Respiratory Failure and Septic Shock due to suspected Healthcare Associated Pneumonia and presumed Pulmonary Embolus. According to the completed Med Rec he is on no chronic anticoacoagulation except aspirin.  Baseline aPTT 30s, INR 1.2  CBC monitoring --Hgb 14.3 >> 12.8 >> 13.5 --Plt 265 >> 233 >> 230  Heparin course 12/9 at 1337 heparin 7500 unit bolus x 1 and drip started at 1800 units/hr 12/9 at 2320 HL 0.85, heparin infusion decreased to 1600 units/hr 12/10 at 0829 HL 1.06,  hold heparin infusion x 1 hour and decrease infusion rate to 1300 units/hr 12/10 @ 1652 HL 0.69, therapeutic s/p held x 1 hr and heparin rate reduced to 1300 units/hr.  Goal of Therapy:  Heparin level 0.3-0.7 units/ml Monitor platelets by anticoagulation protocol: Yes   Plan:  -- CONTINUE Heparin infusion @1300  units/hr --Re-check HL 6 hours  --Daily CBC per protocol while on heparin drip  Myquan Schaumburg R Taneasha Fuqua 03/04/2020,5:46 PM

## 2020-03-04 NOTE — Consult Note (Signed)
[facility] Vascular Consult Note  MRN : [id number]  Paul Mitchell is a 69 y.o. (1950/08/12) male who presents with chief complaint of  Chief Complaint  Patient presents with  . Respiratory Distress   History of Present Illness:  Paul Mitchell is a 69 y.o. Male with a past medical history significant for hypertension and recent COVID-19 diagnosis on 02/22/2020 requiring hospitalization from 02/24/2020 to 02/29/2020 and discharged on 4 L of oxygen via nasal cannula,  who presents to [facility] ED on 03/03/2020 due to acute respiratory distress.  He reports he has become increasingly short of breath beginning last night.  Upon presentation to the ED vitals included: RR 20, HR 133 (atrial fibrillation), BP 75/52, and 92% SpO2 on nonrebreather mask.  EKG with Atrial Fibrillation with RVR, but no evidence of acute ischemia.  Initial work-up in the ED revealed WBC 17.8, lactic acid 4.6, BUN 34, creatinine 2.19, glucose 228, BNP 253, high-sensitivity troponin 1064, and FDP's 7453.  His repeat COVID-19 PCR is positive.  Chest x-ray shows mildly decreasing bilateral lung opacities consistent with slightly improving multifocal pneumonia.  He met sepsis criteria therefore he was given IV fluid resuscitation and broad-spectrum antibiotics with cefepime and vancomycin.  Despite IV fluid resuscitation, he remained hypotensive requiring Levophed infusion.  He was also placed on amiodarone infusion due to atrial fibrillation with RVR.  03/03/20 Bilateral Venous Duplex: Positive exam for deep venous thrombosis in the LEFT lower extremity involving the LEFT popliteal vein and proximal LEFT calf veins as above. No evidence of deep venous thrombosis in the RIGHT lower extremity.  Patient notes improvement in his dyspnea today.  Vascular surgery was consulted by NP Dewaine Conger for possible endovascular intervention.  Current Facility-Administered Medications  Medication Dose Route Frequency Provider Last  Rate Last Admin  . albuterol (VENTOLIN HFA) 108 (90 Base) MCG/ACT inhaler 2 puff  2 puff Inhalation Q4H PRN Darel Hong D, NP      . amiodarone (NEXTERONE PREMIX) 360-4.14 MG/200ML-% (1.8 mg/mL) IV infusion  30 mg/hr Intravenous Continuous Naaman Plummer, MD 16.67 mL/hr at 03/04/20 1300 30 mg/hr at 03/04/20 1300  . ascorbic acid (VITAMIN C) tablet 500 mg  500 mg Oral Daily Darel Hong D, NP   500 mg at 03/04/20 0831  . benzonatate (TESSALON) capsule 200 mg  200 mg Oral BID PRN Darel Hong D, NP      . ceFEPIme (MAXIPIME) 2 g in sodium chloride 0.9 % 100 mL IVPB  2 g Intravenous Q12H Dallie Piles, [facility]   Stopped at 03/04/20 0881  . chlorhexidine (PERIDEX) 0.12 % solution 15 mL  15 mL Mouth Rinse BID Flora Lipps, MD   15 mL at 03/04/20 0844  . Chlorhexidine Gluconate Cloth 2 % PADS 6 each  6 each Topical J0315 Flora Lipps, MD   6 each at 03/04/20 0551  . dexamethasone (DECADRON) injection 6 mg  6 mg Intravenous Q24H Darel Hong D, NP   6 mg at 03/04/20 1312  . docusate sodium (COLACE) capsule 100 mg  100 mg Oral BID PRN Bradly Bienenstock, NP      . guaiFENesin-dextromethorphan (ROBITUSSIN DM) 100-10 MG/5ML syrup 15 mL  15 mL Oral Q6H PRN Darel Hong D, NP      . heparin ADULT infusion 100 units/mL (25000 units/219m sodium chloride 0.45%)  1,300 Units/hr Intravenous Continuous CBenita Gutter RPH      . insulin aspart (novoLOG) injection 0-15 Units  0-15 Units Subcutaneous Q4H KDarel Hong  D, NP   2 Units at 03/04/20 0425  . MEDLINE mouth rinse  15 mL Mouth Rinse q12n4p Flora Lipps, MD   15 mL at 03/03/20 1647  . melatonin tablet 5 mg  5 mg Oral QHS PRN Awilda Bill, NP   5 mg at 03/03/20 2251  . norepinephrine (LEVOPHED) 16 mg in 212m premix infusion  0-40 mcg/min Intravenous Titrated KDarel HongD, NP 5.63 mL/hr at 03/04/20 1300 6 mcg/min at 03/04/20 1300  . ondansetron (ZOFRAN) injection 4 mg  4 mg Intravenous Q6H PRN KFlora Lipps MD   4 mg at 03/03/20 1715   . polyethylene glycol (MIRALAX / GLYCOLAX) packet 17 g  17 g Oral Daily PRN KDarel HongD, NP      . zinc sulfate capsule 220 mg  220 mg Oral Daily KDarel HongD, NP   220 mg at 03/04/20 0831   Past Medical History:  Diagnosis Date  . Hypertension    History reviewed. No pertinent surgical history.  Social History Social History   Tobacco Use  . Smoking status: Never Smoker  . Smokeless tobacco: Never Used  Substance Use Topics  . Alcohol use: Not Currently  . Drug use: Not Currently   Family History History reviewed. No pertinent family history.  Denies family history of peripheral artery disease, venous disease or renal disease.  Allergies  Allergen Reactions  . Enalapril Cough   REVIEW OF SYSTEMS (Negative unless checked)  Constitutional: _0 Weight loss  _1 Fever  _2 Chills Cardiac: _3 Chest pain   _4 Chest pressure   _5 Palpitations   _6 Shortness of breath when laying flat   _7 Shortness of breath at rest   _8 Shortness of breath with exertion. Vascular:  _9 Pain in legs with walking   _10 Pain in legs at rest   _11 Pain in legs when laying flat   _12 Claudication   _13 Pain in feet when walking  _14 Pain in feet at rest  _15 Pain in feet when laying flat   _16 History of DVT   _17 Phlebitis   _18 Swelling in legs   _19 Varicose veins   _20 Non-healing ulcers Pulmonary:   _21 Uses home oxygen   _22 Productive cough   _23 Hemoptysis   _24 Wheeze  _25 COPD   _26 Asthma Neurologic:  _27 Dizziness  _28 Blackouts   _29 Seizures   _30 History of stroke   _31 History of TIA  _32 Aphasia   _33 Temporary blindness   _34 Dysphagia   _35 Weakness or numbness in arms   _36 Weakness or numbness in legs Musculoskeletal:  _37 Arthritis   _38 Joint swelling   _39 Joint pain   _40 Low back pain Hematologic:  _41 Easy bruising  _42 Easy bleeding   _43 Hypercoagulable state   _44 Anemic  _45 Hepatitis Gastrointestinal:  _46 Blood in stool   _47 Vomiting blood  _48 Gastroesophageal reflux/heartburn   _49 Difficulty swallowing. Genitourinary:  _50 Chronic kidney disease    _51 Difficult urination  _52 Frequent urination  _53 Burning with urination   _54 Blood in urine Skin:  _55 Rashes   _56 Ulcers   _57 Wounds Psychological:  _58 History of anxiety   _59  History of major depression.  Recent COVID-19 diagnosis.  Physical Examination  Vitals:   03/04/20 1100 03/04/20 1130 03/04/20 1200 03/04/20 1300  BP: (!) 82/54 (!) 91/55 103/75 98/64  Pulse: 73 74 80 80  Resp: 17 (!) 26 (!) 23 (!) 23  Temp:      TempSrc:      SpO2: 100% 100% 100% 100%  Weight:      Height:       Body mass index is 28.33 kg/m. Gen:  WD/WN, NAD Head: /AT, No temporalis wasting. Prominent temp  pulse not noted. Ear/Nose/Throat: Hearing grossly intact, nares w/o erythema or drainage, oropharynx w/o Erythema/Exudate Eyes: Sclera non-icteric, conjunctiva clear Neck: Trachea midline.  No JVD.  Pulmonary: On supplemental oxygen however is able to speak.  Cardiac: RRR, normal S1, S2. Vascular:  Vessel Right Left  Radial Palpable Palpable  Ulnar Palpable Palpable  Brachial Palpable Palpable  Carotid Palpable, without bruit Palpable, without bruit  Aorta Not palpable N/A  Femoral Palpable Palpable  Popliteal Palpable Palpable  PT Palpable Palpable  DP Palpable Palpable   Left lower extremity: Thigh soft.  Calves appear extremities warm distally to toes.  Minimal edema.  Motor/sensory is intact.  There is no acute vascular compromise noted to the extremity at this time.  Gastrointestinal: soft, non-tender/non-distended. No guarding/reflex.  Musculoskeletal: M/S 5/5 throughout.  Extremities without ischemic changes.  No deformity or atrophy. No edema. Neurologic: Sensation grossly intact in extremities.  Symmetrical.  Speech is fluent. Motor exam as listed above. Psychiatric: Judgment intact, Mood & affect appropriate for pt's clinical situation. Dermatologic: No rashes or ulcers noted.  No cellulitis or open wounds. Lymph : No Cervical, Axillary, or Inguinal lymphadenopathy.  CBC Lab Results   Component Value Date   WBC 16.3 (H) 03/04/2020   HGB 13.5 03/04/2020   HCT 40.2 03/04/2020   MCV 91.0 03/04/2020   PLT 230 03/04/2020   BMET    Component Value Date/Time   NA 136 03/04/2020 0411   K 4.6 03/04/2020 0411   CL 107 03/04/2020 0411   CO2 19 (L) 03/04/2020 0411   GLUCOSE 138 (H) 03/04/2020 0411   BUN 34 (H) 03/04/2020 0411   CREATININE 1.78 (H) 03/04/2020 0411   CALCIUM 7.9 (L) 03/04/2020 0411   GFRNONAA 41 (L) 03/04/2020 0411   Estimated Creatinine Clearance: 50.9 mL/min (A) (by C-G formula based on SCr of 1.78 mg/dL (H)).  COAG Lab Results  Component Value Date   INR 1.2 03/03/2020   Radiology DG Chest 1 View  Result Date: 03/03/2020 CLINICAL DATA:  Shortness of breath. EXAM: CHEST  1 VIEW COMPARISON:  February 24, 2020. FINDINGS: Stable cardiomediastinal silhouette. No pneumothorax or pleural effusion is noted. Mildly decreased bilateral lung opacities are noted suggesting improving multifocal pneumonia. Bony thorax is unremarkable. IMPRESSION: Mildly decreased bilateral lung opacities are noted suggesting improving multifocal pneumonia. Aortic Atherosclerosis (ICD10-I70.0). Electronically Signed   By: Marijo Conception M.D.   On: 03/03/2020 08:28   DG Chest 2 View  Result Date: 02/19/2020 CLINICAL DATA:  Shortness of breath. EXAM: CHEST - 2 VIEW COMPARISON:  CT 12/01/2008. FINDINGS: Mediastinum hilar structures normal. Cardiomegaly. No pulmonary venous congestion. Bilateral pulmonary infiltrates/edema. No pleural effusion or pneumothorax. Degenerative change thoracic spine. IMPRESSION: 1. Cardiomegaly. No pulmonary venous congestion. 2. Bilateral pulmonary infiltrates/edema. Electronically Signed   By: Marcello Moores  Register   On: 02/19/2020 13:26   US Venous Img Lower Bilateral (DVT)  Result Date: 03/03/2020 CLINICAL DATA:  Elevated D-dimer EXAM: BILATERAL LOWER EXTREMITY VENOUS DOPPLER ULTRASOUND TECHNIQUE: Gray-scale sonography with compression, as well as color and  duplex ultrasound, were performed to evaluate the deep venous system(s) from the level of the common femoral vein through the popliteal and proximal calf veins. COMPARISON:  None FINDINGS: VENOUS RIGHT lower extremity: Deep venous system is patent and compressible from the RIGHT common femoral vein through the RIGHT popliteal vein into visualized calf veins. Spontaneous venous flow is present with intact augmentation. No intraluminal thrombus identified. LEFT lower extremity: LEFT common femoral, profundus femoral, and femoral veins are patent and  compressible. Spontaneous flow is present respiratory phasicity. No intraluminal thrombus identified. However, at the LEFT popliteal vein and extending into proximal LEFT calf veins nonocclusive intraluminal thrombus is identified consistent with deep venous thrombosis. Diminished spontaneous venous flow is seen and these veins are only partially compressible. OTHER None Limitations: none IMPRESSION: Positive exam for deep venous thrombosis in the LEFT lower extremity involving the LEFT popliteal vein and proximal LEFT calf veins as above. No evidence of deep venous thrombosis in the RIGHT lower extremity. Electronically Signed   By: Lavonia Dana M.D.   On: 03/03/2020 18:02   DG Chest Port 1 View  Result Date: 03/03/2020 CLINICAL DATA:  Central line placement EXAM: PORTABLE CHEST 1 VIEW COMPARISON:  March 03, 2020 FINDINGS: The heart size and mediastinal contours are mildly enlarged. Aortic knob calcifications are seen. A left-sided central venous catheter is seen with the tip just entering the brachiocephalic SVC junction. Multifocal patchy airspace opacity seen throughout both lungs. No acute osseous abnormality. IMPRESSION: Left-sided central venous catheter with the tip just entering the brachiocephalic/SVC junction. Unchanged multifocal airspace opacities Electronically Signed   By: Prudencio Pair M.D.   On: 03/03/2020 19:23   DG Chest Port 1 View  Result Date:  02/24/2020 CLINICAL DATA:  69 year old male with shortness of breath. EXAM: PORTABLE CHEST 1 VIEW COMPARISON:  Chest radiograph dated 02/19/2020. FINDINGS: Significant interval worsening of bilateral pulmonary infiltrate compared to prior radiograph in keeping with worsening multifocal pneumonia. Clinical correlation and follow-up to resolution recommended. No pleural effusion pneumothorax. Stable cardiac silhouette. Atherosclerotic calcification of the aorta. No acute osseous pathology. IMPRESSION: Worsening multifocal pneumonia. Electronically Signed   By: Anner Crete M.D.   On: 02/24/2020 15:34   ECHOCARDIOGRAM COMPLETE  Result Date: 03/04/2020    ECHOCARDIOGRAM REPORT   Patient Name:   BRICE KOSSMAN Date of Exam: 03/04/2020 Medical Rec #:  [id number]       Height:       74.0 in Accession #:    [id number]      Weight:       220.7 lb Date of Birth:  November 27, 1950      BSA:          2.266 m Patient Age:    74 years        BP:           98/71 mmHg Patient Gender: M               HR:           74 bpm. Exam Location:  [facility] Procedure: 2D Echo Indications:     Elevated Troponin  History:         Patient has no prior history of Echocardiogram examinations.                  Arrythmias:Atrial Fibrillation; Risk Factors:Hypertension.  Sonographer:     L Thornton-Maynard Referring Phys:  [id number] Bradly Bienenstock Diagnosing Phys: Isaias Cowman MD IMPRESSIONS  1. Left ventricular ejection fraction, by estimation, is 60 to 65%. The left ventricle has normal function. The left ventricle has no regional wall motion abnormalities. Left ventricular diastolic parameters are consistent with Grade I diastolic dysfunction (impaired relaxation).  2. Right ventricular systolic function is normal. The right ventricular size is normal. There is moderately elevated pulmonary artery systolic pressure.  3. The mitral valve is normal in structure. Mild to moderate mitral valve regurgitation. No evidence of mitral stenosis.  4.  Tricuspid valve regurgitation is  mild to moderate.  5. The aortic valve is normal in structure. Aortic valve regurgitation is not visualized. No aortic stenosis is present.  6. The inferior vena cava is normal in size with greater than 50% respiratory variability, suggesting right atrial pressure of 3 mmHg. FINDINGS  Left Ventricle: Left ventricular ejection fraction, by estimation, is 60 to 65%. The left ventricle has normal function. The left ventricle has no regional wall motion abnormalities. The left ventricular internal cavity size was normal in size. There is  no left ventricular hypertrophy. Left ventricular diastolic parameters are consistent with Grade I diastolic dysfunction (impaired relaxation). Right Ventricle: The right ventricular size is normal. No increase in right ventricular wall thickness. Right ventricular systolic function is normal. There is moderately elevated pulmonary artery systolic pressure. The tricuspid regurgitant velocity is 3.00 m/s, and with an assumed right atrial pressure of 10 mmHg, the estimated right ventricular systolic pressure is 50.3 mmHg. Left Atrium: Left atrial size was normal in size. Right Atrium: Right atrial size was normal in size. Pericardium: There is no evidence of pericardial effusion. Mitral Valve: The mitral valve is normal in structure. Mild to moderate mitral valve regurgitation. No evidence of mitral valve stenosis. Tricuspid Valve: The tricuspid valve is normal in structure. Tricuspid valve regurgitation is mild to moderate. No evidence of tricuspid stenosis. Aortic Valve: The aortic valve is normal in structure. Aortic valve regurgitation is not visualized. No aortic stenosis is present. Aortic valve mean gradient measures 3.0 mmHg. Aortic valve peak gradient measures 4.0 mmHg. Aortic valve area, by VTI measures 4.72 cm. Pulmonic Valve: The pulmonic valve was normal in structure. Pulmonic valve regurgitation is not visualized. No evidence of pulmonic  stenosis. Aorta: The aortic root is normal in size and structure. Venous: The inferior vena cava is normal in size with greater than 50% respiratory variability, suggesting right atrial pressure of 3 mmHg. IAS/Shunts: No atrial level shunt detected by color flow Doppler.  LEFT VENTRICLE PLAX 2D LVIDd:         3.10 cm  Diastology LVIDs:         1.86 cm  LV e' medial:    5.22 cm/s LV PW:         1.43 cm  LV E/e' medial:  6.5 LV IVS:        1.87 cm  LV e' lateral:   7.94 cm/s LVOT diam:     2.70 cm  LV E/e' lateral: 4.3 LV SV:         86 LV SV Index:   38 LVOT Area:     5.73 cm  RIGHT VENTRICLE RV Basal diam:  5.65 cm RV S prime:     8.59 cm/s TAPSE (M-mode): 1.3 cm LEFT ATRIUM             Index LA diam:        2.20 cm 0.97 cm/m LA Vol (A2C):   48.1 ml 21.22 ml/m LA Vol (A4C):   42.4 ml 18.71 ml/m LA Biplane Vol: 46.6 ml 20.56 ml/m  AORTIC VALVE AV Area (Vmax):    5.09 cm AV Area (Vmean):   4.59 cm AV Area (VTI):     4.72 cm AV Vmax:           100.00 cm/s AV Vmean:          84.000 cm/s AV VTI:            0.183 m AV Peak Grad:      4.0 mmHg  AV Mean Grad:      3.0 mmHg LVOT Vmax:         88.90 cm/s LVOT Vmean:        67.400 cm/s LVOT VTI:          0.151 m LVOT/AV VTI ratio: 0.83  AORTA Ao Root diam: 4.50 cm MV E velocity: 34.10 cm/s  TRICUSPID VALVE MV A velocity: 52.20 cm/s  TR Peak grad:   36.0 mmHg MV E/A ratio:  0.65        TR Vmax:        300.00 cm/s                             SHUNTS                            Systemic VTI:  0.15 m                            Systemic Diam: 2.70 cm Isaias Cowman MD Electronically signed by Isaias Cowman MD Signature Date/Time: 03/04/2020/2:47:00 PM    Final    Assessment/Plan Kedron Uno is a 69 y.o. Male with a past medical history significant for hypertension and recent COVID-19 diagnosis on 02/22/2020 requiring hospitalization from 02/24/2020 to 02/29/2020 and discharged on 4 L of oxygen via nasal cannula,  who presents to [facility] ED on 03/03/2020 due to acute  respiratory distress.  1.  Left lower extremity DVT: Distal DVT noted on March 03, 2020 venous duplex. Lower extremity physical exam essentially unremarkable with the exception of some edema.   No acute vascular compromise noted to the bilateral extremities on exam today. Agree with initiation of heparin with the plan to transition over to Eliquis when medically stable. No indication for endovascular intervention for relatively asymptomatic distal DVT with an essentially normal physical exam. Will continue to follow the patient's DVT in the outpatient setting.   2.  Possible PE: Possible PE in the setting of acute respiratory distress with recent diagnosis of Covid pneumonia. Recently discharged on 4 L nasal cannula. Patient notes improvement in dyspnea today. Unable to undergo CTA chest due to renal dysfunction At this point, continue to treat with heparin with the plan of transitioning to Eliquis when medically stable. If the patient's breathing status should worsen over the next few days can certainly revisit the idea of a pulmonary angiogram (two week period) as this would use less dye when compared to CTA.  We will continue to follow along  3. COVID 19: Diagnosed on 02/22/2020 requiring hospitalization from 02/24/2020 to 02/29/2020 and discharged on 4 L of oxygen via nasal cannula Care as per ICU / primary team  Discussed with Dr. Francene Castle, PA-C  03/04/2020 2:52 PM  This note was created with Dragon medical transcription system.  Any error is purely unintentional

## 2020-03-04 NOTE — Progress Notes (Signed)
[facility] Cardiology    SUBJECTIVE: The patient reports feeling "good" with no complaints at this time. He denies chest pain or palpitations. His breathing is currently stable on supplemental oxygen via [place] at rest.   Vitals:   03/04/20 0900 03/04/20 1000 03/04/20 1100 03/04/20 1130  BP: 103/73 90/64 (!) 82/54 (!) 91/55  Pulse: 80 77 73 74  Resp: (!) 21 17 17  (!) 26  Temp:      TempSrc:      SpO2: 98% 100% 100% 100%  Weight:      Height:         Intake/Output Summary (Last 24 hours) at 03/04/2020 1224 Last data filed at 03/04/2020 1100 Gross per 24 hour  Intake 3064.4 ml  Output 1305 ml  Net 1759.4 ml      PHYSICAL EXAM   General: Ill-appearing, on supplemental oxygen via nonrebreather, lying in bed HEENT:  Normocephalic and atramatic Neck:   No JVD.  Lungs: normal effort of breathing on supplemental oxygen via [place], speaking in full sentences Heart: regular rate and rhythm, without gallops or murmurs.  Abdomen: Nondistended Msk: No obvious deformities Extremities: Trace pedal edema.   Neuro: Alert and oriented X 3. Psych:  Good affect, responds appropriately   LABS: Basic Metabolic Panel: Recent Labs    03/03/20 0900 03/04/20 0411  NA 135 136  K 4.6 4.6  CL 100 107  CO2 21* 19*  GLUCOSE 228* 138*  BUN 34* 34*  CREATININE 2.19* 1.78*  CALCIUM 8.3* 7.9*  MG 2.4 2.2  PHOS  --  3.6   Liver Function Tests: Recent Labs    03/04/20 0411  AST 35  ALT 48*  ALKPHOS 42  BILITOT 1.1  PROT 6.0*  ALBUMIN 2.6*   No results for input(s): LIPASE, AMYLASE in the last 72 hours. CBC: Recent Labs    03/03/20 1304 03/04/20 0829  WBC 17.8* 16.3*  NEUTROABS 13.3* 10.9*  HGB 12.8* 13.5  HCT 38.1* 40.2  MCV 90.7 91.0  PLT 233 230   Cardiac Enzymes: No results for input(s): CKTOTAL, CKMB, CKMBINDEX, TROPONINI in the last 72 hours. BNP: Invalid input(s): POCBNP D-Dimer: No results for input(s): DDIMER in the last 72 hours. Hemoglobin A1C: Recent Labs     03/03/20 1304  HGBA1C 6.4*   Fasting Lipid Panel: No results for input(s): CHOL, HDL, LDLCALC, TRIG, CHOLHDL, LDLDIRECT in the last 72 hours. Thyroid Function Tests: No results for input(s): TSH, T4TOTAL, T3FREE, THYROIDAB in the last 72 hours.  Invalid input(s): FREET3 Anemia Panel: Recent Labs    03/04/20 0411  FERRITIN 775*    DG Chest 1 View  Result Date: 03/03/2020 CLINICAL DATA:  Shortness of breath. EXAM: CHEST  1 VIEW COMPARISON:  February 24, 2020. FINDINGS: Stable cardiomediastinal silhouette. No pneumothorax or pleural effusion is noted. Mildly decreased bilateral lung opacities are noted suggesting improving multifocal pneumonia. Bony thorax is unremarkable. IMPRESSION: Mildly decreased bilateral lung opacities are noted suggesting improving multifocal pneumonia. Aortic Atherosclerosis (ICD10-I70.0). Electronically Signed   By: February 26, 2020 M.D.   On: 03/03/2020 08:28   14/11/2019 Venous Img Lower Bilateral (DVT)  Result Date: 03/03/2020 CLINICAL DATA:  Elevated D-dimer EXAM: BILATERAL LOWER EXTREMITY VENOUS DOPPLER ULTRASOUND TECHNIQUE: Gray-scale sonography with compression, as well as color and duplex ultrasound, were performed to evaluate the deep venous system(s) from the level of the common femoral vein through the popliteal and proximal calf veins. COMPARISON:  None FINDINGS: VENOUS RIGHT lower extremity: Deep venous system is patent and compressible  from the RIGHT common femoral vein through the RIGHT popliteal vein into visualized calf veins. Spontaneous venous flow is present with intact augmentation. No intraluminal thrombus identified. LEFT lower extremity: LEFT common femoral, profundus femoral, and femoral veins are patent and compressible. Spontaneous flow is present respiratory phasicity. No intraluminal thrombus identified. However, at the LEFT popliteal vein and extending into proximal LEFT calf veins nonocclusive intraluminal thrombus is identified consistent with deep  venous thrombosis. Diminished spontaneous venous flow is seen and these veins are only partially compressible. OTHER None Limitations: none IMPRESSION: Positive exam for deep venous thrombosis in the LEFT lower extremity involving the LEFT popliteal vein and proximal LEFT calf veins as above. No evidence of deep venous thrombosis in the RIGHT lower extremity. Electronically Signed   By: Ulyses Southward M.D.   On: 03/03/2020 18:02   DG Chest Port 1 View  Result Date: 03/03/2020 CLINICAL DATA:  Central line placement EXAM: PORTABLE CHEST 1 VIEW COMPARISON:  March 03, 2020 FINDINGS: The heart size and mediastinal contours are mildly enlarged. Aortic knob calcifications are seen. A left-sided central venous catheter is seen with the tip just entering the brachiocephalic SVC junction. Multifocal patchy airspace opacity seen throughout both lungs. No acute osseous abnormality. IMPRESSION: Left-sided central venous catheter with the tip just entering the brachiocephalic/SVC junction. Unchanged multifocal airspace opacities Electronically Signed   By: Jonna Clark M.D.   On: 03/03/2020 19:23     Echo: normal left ventricular function, LVEF 60-65% with moderately elevated pulmonary artery systolic pressure, mild to moderate mitral and tricuspid regurgitation  TELEMETRY: sinus rhythm,. 83 bpm  ASSESSMENT AND PLAN:  Active Problems:   Acute respiratory failure with hypoxia (HCC)    1. New onset atrial fibrillation, in the setting of acute hypoxic respiratory failure, septic shock in the setting of COVID-19 pneumonia, possible superimposed HCAP, and presumed pulmonary embolus with left lower extremity DVT. The patient was started on heparin and amiodarone drips. Converted to sinus rhythm. 2D echocardiogram reveals normal left ventricular function with LVEF 60-65% with mild to moderate mitral and tricuspid regurgitation. 2. Septic shock due to COVID pneumonia and possible superimposed bacterial pneumonia, on  vasopressors 3. Elevated troponin, secondary to demand supply ischemia due to the above 4. DVT, possible PE, on heparin drip 5. Acute hypoxic respiratory failure due to COVID pneumonia, possible HCAP  Recommendations: 1. Continue amiodarone drip for now; possible transition to oral tomorrow 2. Continue heparin drip for now; will plan to start Eliquis for DVT and atrial fibrillation 3. Defer further cardiac diagnostics at this time.      Leanora Ivanoff, PA-C 03/04/2020 12:24 PM

## 2020-03-04 NOTE — Progress Notes (Addendum)
NAME:  Paul Mitchell, MRN:  [id number], DOB:  04-Oct-1950, LOS: 1 ADMISSION DATE:  03/03/2020, CONSULTATION DATE:  03/03/2020 REFERRING MD:  Dr. Cheri Fowler, CHIEF COMPLAINT:  Acute Respiratory Distress   Brief History   69 y.o. Male recently admitted from 02/24/20 to 02/29/20 for COVID-19  Pneumonia, now presents with Acute Hypoxic Respiratory Failure and Septic Shock due to COVID-19 Pneumonia, suspected Healthcare Associated Pneumonia, along with presumed Pulmonary Embolus and new onset Atrial Fibrillation with RVR.  Found to have left lower extremity DVT.  History of present illness   Paul Mitchell is a 69 y.o. Male with a past medical history significant for hypertension and recent COVID-19 diagnosis on 02/22/2020 requiring hospitalization from 02/24/2020 to 02/29/2020 and discharged on 4 L of oxygen via nasal cannula,  who presents to [facility] ED on 03/03/2020 due to acute respiratory distress.  He reports he has become increasingly short of breath beginning last night.  Upon EMS arrival he was found to be severely hypoxic with O2 saturations in the low 70s of which he was placed on nonrebreather mask with mild improvement.  The patient endorses palpitations and some mild chest tightness in addition to shortness of breath.  He denies fever, chills, abdominal pain, nausea, vomiting, diarrhea, dysuria.  Upon presentation to the ED vitals included: RR 20, HR 133 (atrial fibrillation), BP 75/52, and 92% SpO2 on nonrebreather mask.  EKG with Atrial Fibrillation with RVR, but no evidence of acute ischemia.  Initial work-up in the ED revealed WBC 17.8, lactic acid 4.6, BUN 34, creatinine 2.19, glucose 228, BNP 253, high-sensitivity troponin 1064, and FDP's 7453.  His repeat COVID-19 PCR is positive.  Chest x-ray shows mildly decreasing bilateral lung opacities consistent with slightly improving multifocal pneumonia.  He met sepsis criteria therefore he was given IV fluid resuscitation and broad-spectrum antibiotics  with cefepime and vancomycin.  Despite IV fluid resuscitation, he remained hypotensive requiring Levophed infusion.  He was also placed on amiodarone infusion due to atrial fibrillation with RVR.  PCCM is asked to admit the patient to ICU for further work-up and treatment of acute hypoxic respiratory failure and septic shock  in the setting of COVID-19 pneumonia, suspected superimposed healthcare associated pneumonia, and presumed pulmonary embolus.   Past Medical History  Hypertension Recent COVID-19 Pneumonia (diagnosed 02/22/20, hospitalized 02/24/20 to 02/29/20)   Significant Hospital Events   12/9: PCCM asked to admit to ICU 12/9: + for LLE DVT, Vascular Surgery consulted 12/10: FiO2 requirement improved to 10L HFNC, reports improving respiratory status  Consults:  PCCM Cardiology Vascular Surgery  Procedures:  12/9: Left IJ CVC placed  Significant Diagnostic Tests:  12/9: Venous US BLE>>Positive exam for deep venous thrombosis in the LEFT lower extremity involving the LEFT popliteal vein and proximal LEFT calf veins as above. No evidence of deep venous thrombosis in the RIGHT lower extremity. 12/10: 2D Echocardiogram>>  Micro Data:  12/9: COVID-19 PCR>> positive 12/9: Influenza PCR>> negative 12/9: MRSA PCR>>negative 12/9: Blood culture x2>>no growth to date 12/9: Sputum>> 12/9: Strep pneumo urinary antigen>>negative 12/9: Legionella urinary antigen>>  Antimicrobials:  Cefepime 12/9>> Vancomycin 12/9>>12/10  Interim history/subjective:  Venous US of LE's is positive for DVT in Left lower extremity No events reported overnight On Levophed 6 mcg, Heparin and Amiodarone Pt reports his breathing has greatly improved, now denies chest tightness Denies chest pain, abdominal pain, N/V/D, dysuria, fever/chills On 10 L HFNC   Objective   Blood pressure 98/71, pulse 74, temperature 98.2 F (36.8 C), temperature source Oral,  resp. rate 12, height '6\' 2"'  (1.88 m), weight  100.1 kg, SpO2 100 %.        Intake/Output Summary (Last 24 hours) at 03/04/2020 0938 Last data filed at 03/04/2020 0600 Gross per 24 hour  Intake 3877.65 ml  Output 1055 ml  Net 2822.65 ml   Filed Weights   03/03/20 0746 03/03/20 1600 03/04/20 0431  Weight: 108.9 kg 100.2 kg 100.1 kg    Examination: General: Acutely ill appearing male, sitting in bed, on 10L HFNC, in NAD HENT: Atraumatic, normocephalic, neck supple, no JVD Lungs: Unable to auscultate due to CAPR, even, nonlabored Cardiovascular: Regular rate and rhythm, s1s2, no M/R/G, 1+ distal pulses Abdomen: Soft, nontender, nondistended, no guarding or rebound tenderness Extremities: Normal bulk and tone, no deformities, no edema. Negative homan's sign Neuro: Awake, A&O x4, follows commands, no focal deficits, speech clear Skin: Warm and dry.  No obvious rashes, lesions, or ulcerations  Resolved Hospital Problem list   N/A  Assessment & Plan:   Acute Hypoxic Respiratory Failure in the setting of COVID-19 Pneumonia, questionable superimposed Healthcare Associated Pneumonia, and PRESUMED PULMONARY EMBOLUS -Supplemental O2 as needed to maintain O2 sats >88% -High risk for intubation -Follow intermittent CXR and ABG as needed -Continue Cefepime, will d/c Vancomycin today -Treating for presumed PE with Heparin gtt, unable to obtain CTA Chest due to AKI (+ for LLE DVT) -Prn Bronchodilators via MDI -Antitussives -Incentive Spirometry and pulmonary hygiene -Self-proning as tolerated -Maintain Euvovolemia to net negative fluid balance as able   Septic Shock +/- Obstructive Shock due to presumed PE Elevated Troponin, demand ischemia vs. NSTEMI Atrial Fibrillation w/ RVR>>has converted to NSR Left Lower Extremity DVT -Continuous cardiac monitoring -Maintain MAP >65 -Received IV fluid resuscitation in ED -Vasopressors if needed to maintain MAP goal -Check CVP -Trend lactic acid -Trend troponin -Heparin gtt -Amiodarone  gtt -Cardiology following, appreciate input -2D Echocardiogram pending -Vascular Surgery consulted, appreciate input   Severe Sepsis secondary to COVID-19 Pneumonia and questionable superimposed Bacterial Pneumonia -Monitor fever curve -Trend WBC's and Procalcitonin -Follow cultures as above -Continue Cefepime, will d/c Vancomycin today -Previously completed course of Remdesivir and 5 day course of Baricitinib during his previous hospital admission from 02/24/20 to 02/29/20 -IV Steroids -Follow inflammatory markers: Ferritin, D-dimer, CRP, IL-6, LDH -Vitamin C, zinc -Maintain Airborne and contact precautions   AKI>>improving -Monitor I&O's / urinary output -Follow BMP -Ensure adequate renal perfusion -Avoid nephrotoxic agents as able -Replace electrolytes as indicated   Hyperglycemia -CBG's -SSI -Follow ICU Hypo/hyperglycemia protocol     Best practice (evaluated daily)   Diet: Heart healthy Pain/Anxiety/Delirium protocol (if indicated): N/A VAP protocol (if indicated): N/A DVT prophylaxis: Heparin gtt GI prophylaxis: N/A Glucose control: SSI Mobility: As tolerated last date of multidisciplinary goals of care discussion: 03/04/2020 Family and staff present: Updated pt and RN at bedside 03/04/2020. Updated pt's wife via telephone 03/04/20 Summary of discussion: Highly suspicious for PE of which treating with Heparin, Vascular Surgery consult pending, will continue to treat with Cefepime Follow up goals of care discussion due: 03/05/2020 Code Status: Full Code Disposition: ICU  Labs   CBC: Recent Labs  Lab 02/27/20 0424 02/28/20 0415 02/29/20 0431 03/03/20 0758 03/03/20 1304 03/04/20 0829  WBC 8.7 6.5 8.7 17.8* 17.8* 16.3*  NEUTROABS 6.7 4.7 5.5  --  13.3* 10.9*  HGB 12.4* 11.9* 12.0* 14.3 12.8* 13.5  HCT 36.6* 34.4* 35.3* 43.3 38.1* 40.2  MCV 88.8 88.9 89.4 92.5 90.7 91.0  PLT 258 233 237 265 233 230  Basic Metabolic Panel: Recent Labs  Lab  02/27/20 0424 02/28/20 0415 02/29/20 0431 03/03/20 0900 03/04/20 0411  NA 138 136 137 135 136  K 4.0 4.6 4.3 4.6 4.6  CL 103 103 104 100 107  CO2 '24 25 28 ' 21* 19*  GLUCOSE 175* 165* 100* 228* 138*  BUN 43* 38* 31* 34* 34*  CREATININE 1.24 1.12 1.21 2.19* 1.78*  CALCIUM 7.9* 7.9* 8.0* 8.3* 7.9*  MG  --   --   --  2.4 2.2  PHOS  --   --   --   --  3.6   GFR: Estimated Creatinine Clearance: 50.9 mL/min (A) (by C-G formula based on SCr of 1.78 mg/dL (H)). Recent Labs  Lab 02/29/20 0431 03/03/20 0758 03/03/20 1036 03/03/20 1250 03/03/20 1304 03/04/20 0411 03/04/20 0829  PROCALCITON  --   --   --   --  0.16 0.18  --   WBC 8.7 17.8*  --   --  17.8*  --  16.3*  LATICACIDVEN  --   --  4.6* 2.8*  --   --   --     Liver Function Tests: Recent Labs  Lab 03/04/20 0411  AST 35  ALT 48*  ALKPHOS 42  BILITOT 1.1  PROT 6.0*  ALBUMIN 2.6*   No results for input(s): LIPASE, AMYLASE in the last 168 hours. No results for input(s): AMMONIA in the last 168 hours.  ABG No results found for: PHART, PCO2ART, PO2ART, HCO3, TCO2, ACIDBASEDEF, O2SAT   Coagulation Profile: Recent Labs  Lab 03/03/20 1323  INR 1.2    Cardiac Enzymes: No results for input(s): CKTOTAL, CKMB, CKMBINDEX, TROPONINI in the last 168 hours.  HbA1C: Hgb A1c MFr Bld  Date/Time Value Ref Range Status  03/03/2020 01:04 PM 6.4 (H) 4.8 - 5.6 % Final    Comment:    (NOTE) Pre diabetes:          5.7%-6.4%  Diabetes:              >6.4%  Glycemic control for   <7.0% adults with diabetes     CBG: Recent Labs  Lab 03/03/20 1605 03/03/20 1956 03/04/20 0022 03/04/20 0412 03/04/20 0727  GLUCAP 227* 152* 150* 128* 99    Review of Systems:   Positives in BOLD: Gen: Denies fever, chills, weight change, fatigue, night sweats HEENT: Denies blurred vision, double vision, hearing loss, tinnitus, sinus congestion, rhinorrhea, sore throat, neck stiffness, dysphagia PULM: Denies +shortness of breath, +cough,  sputum production, hemoptysis, wheezing CV: Denies chest tightness, edema, orthopnea, paroxysmal nocturnal dyspnea, palpitations GI: Denies abdominal pain, nausea, vomiting, diarrhea, hematochezia, melena, constipation, change in bowel habits GU: Denies dysuria, hematuria, polyuria, oliguria, urethral discharge Endocrine: Denies hot or cold intolerance, polyuria, polyphagia or appetite change Derm: Denies rash, dry skin, scaling or peeling skin change Heme: Denies easy bruising, bleeding, bleeding gums Neuro: Denies headache, numbness, weakness, slurred speech, loss of memory or consciousness   Past Medical History  He,  has a past medical history of Hypertension.   Surgical History   History reviewed. No pertinent surgical history.   Social History   reports that he has never smoked. He has never used smokeless tobacco. He reports previous alcohol use. He reports previous drug use.   Family History   His family history is not on file.   Allergies Allergies  Allergen Reactions  . Enalapril Cough     Home Medications  Prior to Admission medications   Medication Sig Start Date  End Date Taking? Authorizing Provider  albuterol (VENTOLIN HFA) 108 (90 Base) MCG/ACT inhaler Inhale 2 puffs into the lungs every 6 (six) hours as needed for wheezing or shortness of breath. 02/29/20  Yes Sreenath, Sudheer B, MD  aspirin EC 81 MG tablet Take 81 mg by mouth daily.   Yes [provider]  benzonatate (TESSALON PERLES) 100 MG capsule Take 1 capsule (100 mg total) by mouth 3 (three) times daily as needed for cough. 02/29/20 02/28/21 Yes Sreenath, Sudheer B, MD  dexamethasone (DECADRON) 6 MG tablet Take 1 tablet (6 mg total) by mouth daily for 5 days. 02/29/20 03/05/20 Yes Sreenath, Sudheer B, MD  furosemide (LASIX) 20 MG tablet Take 20 mg by mouth daily. 12/26/19  Yes [provider]  guaiFENesin-dextromethorphan (ROBITUSSIN DM) 100-10 MG/5ML syrup Take 10 mLs by mouth every 4 (four)  hours as needed for cough. 02/29/20  Yes Sreenath, Sudheer B, MD  hydrochlorothiazide (MICROZIDE) 12.5 MG capsule Take 12.5 mg by mouth every morning. 04/04/18  Yes [provider]  losartan (COZAAR) 100 MG tablet Take 100 mg by mouth daily. 04/04/18  Yes [provider]     Critical care time: 30 minutes       Darel Hong, [facility] [place] [facility] Pager: [phone]

## 2020-03-04 NOTE — Progress Notes (Signed)
ANTICOAGULATION CONSULT NOTE  Pharmacy Consult for heparin infusion Indication: chest pain/ACS and presumed pulmonary embolus  Patient Measurements: Height: 6\' 2"  (188 cm) Weight: 100.2 kg (220 lb 14.4 oz) IBW/kg (Calculated) : 82.2 Heparin Dosing Weight: 104.6  Vital Signs: Temp: 98 F (36.7 C) (12/09 2000) Temp Source: Axillary (12/09 2000) BP: 97/72 (12/09 2315) Pulse Rate: 79 (12/09 2315)  Labs: Recent Labs    03/03/20 0758 03/03/20 0900 03/03/20 0900 03/03/20 1027 03/03/20 1304 03/03/20 1323 03/03/20 1357 03/03/20 2320  HGB 14.3  --   --   --  12.8*  --   --   --   HCT 43.3  --   --   --  38.1*  --   --   --   PLT 265  --   --   --  233  --   --   --   APTT  --   --   --   --   --  30  --   --   LABPROT  --   --   --   --   --  14.9  --   --   INR  --   --   --   --   --  1.2  --   --   HEPARINUNFRC  --   --   --   --   --   --   --  0.85*  CREATININE  --  2.19*  --   --   --   --   --   --   TROPONINIHS  --  1,064*   < > 1,145*  --   --  1,064* 1,477*   < > = values in this interval not displayed.    Estimated Creatinine Clearance: 41.4 mL/min (A) (by C-G formula based on SCr of 2.19 mg/dL (H)).   Medical History: Past Medical History:  Diagnosis Date  . Hypertension     Assessment: 69 y.o. Male recently admitted from 12/1 to 12/6 for COVID-19 Pneumonia, now presents with Acute Hypoxic Respiratory Failure and Septic Shock due to suspected Healthcare Associated Pneumonia and presumed Pulmonary Embolus. According to the completed Med Rec he is on no chronic anticoacoagulation except aspirin. Baseline H&H, platelets wnl, other labs ordered but not yet resulted.  Goal of Therapy:  Heparin level 0.3-0.7 units/ml Monitor platelets by anticoagulation protocol: Yes   Plan:  Give 7500 units bolus x 1 Start heparin infusion at 1800 units/hr Check anti-Xa level in 6 hours and daily while on heparin Continue to monitor H&H and platelets  12/9 @ 2320 HL 0.85  SUPRAtherapeutic.  Will decrease Heparin infusion to 1600 units/hr and recheck HL in ~ 6 hours  14/9 A 03/04/2020,12:36 AM

## 2020-03-04 NOTE — Progress Notes (Addendum)
ANTICOAGULATION CONSULT NOTE  Pharmacy Consult for heparin infusion Indication: chest pain/ACS and presumed pulmonary embolus  Patient Measurements: Heparin Dosing Weight: 104.6  Vital Signs: Temp: 98.2 F (36.8 C) (12/10 0015) Temp Source: Oral (12/10 0015) BP: 98/71 (12/10 0615) Pulse Rate: 74 (12/10 0615)  Labs: Recent Labs    03/03/20 0758 03/03/20 0900 03/03/20 0900 03/03/20 1027 03/03/20 1304 03/03/20 1323 03/03/20 1357 03/03/20 2320 03/04/20 0411 03/04/20 0829  HGB 14.3  --   --   --  12.8*  --   --   --   --  13.5  HCT 43.3  --   --   --  38.1*  --   --   --   --  40.2  PLT 265  --   --   --  233  --   --   --   --  230  APTT  --   --   --   --   --  30  --   --   --   --   LABPROT  --   --   --   --   --  14.9  --   --   --   --   INR  --   --   --   --   --  1.2  --   --   --   --   HEPARINUNFRC  --   --   --   --   --   --   --  0.85*  --  1.06*  CREATININE  --  2.19*  --   --   --   --   --   --  1.78*  --   TROPONINIHS  --  1,064*   < > 1,145*  --   --  1,064* 1,477*  --   --    < > = values in this interval not displayed.    Estimated Creatinine Clearance: 50.9 mL/min (A) (by C-G formula based on SCr of 1.78 mg/dL (H)).   Medical History: Past Medical History:  Diagnosis Date  . Hypertension     Assessment: 69 y.o. Male recently admitted from 12/1 to 12/6 for COVID-19 Pneumonia, now presents with Acute Hypoxic Respiratory Failure and Septic Shock due to suspected Healthcare Associated Pneumonia and presumed Pulmonary Embolus. According to the completed Med Rec he is on no chronic anticoacoagulation except aspirin.  Baseline aPTT 30s, INR 1.2  CBC monitoring --Hgb 14.3 >> 12.8 >> 13.5 --Plt 265 >> 233 >> 230  Heparin course 12/9 at 1337 heparin 7500 unit bolus x 1 and drip started at 1800 units/hr 12/9 at 2320 HL 0.85, heparin infusion decreased to 1600 units/hr 12/10 at 0829 HL 1.06, hold heparin infusion x 1 hour and decrease infusion rate  to 1300 units/hr  Goal of Therapy:  Heparin level 0.3-0.7 units/ml Monitor platelets by anticoagulation protocol: Yes   Plan:  --Heparin level is supratherapeutic. Will hold infusion x 1 hour and then re-start drip at reduced rate of 1300 units/hr --Re-check HL 6 hours after re-initiation of infusion --Daily CBC per protocol while on heparin drip  Tressie Ellis 03/04/2020,9:04 AM

## 2020-03-05 LAB — FIBRIN DERIVATIVES D-DIMER (ARMC ONLY): Fibrin derivatives D-dimer (ARMC): 2488.57 ng/mL (FEU) — ABNORMAL HIGH (ref 0.00–499.00)

## 2020-03-05 LAB — CBC WITH DIFFERENTIAL/PLATELET
Abs Immature Granulocytes: 0.11 10*3/uL — ABNORMAL HIGH (ref 0.00–0.07)
Basophils Absolute: 0 10*3/uL (ref 0.0–0.1)
Basophils Relative: 0 %
Eosinophils Absolute: 0 10*3/uL (ref 0.0–0.5)
Eosinophils Relative: 0 %
HCT: 35.1 % — ABNORMAL LOW (ref 39.0–52.0)
Hemoglobin: 12 g/dL — ABNORMAL LOW (ref 13.0–17.0)
Immature Granulocytes: 1 %
Lymphocytes Relative: 15 %
Lymphs Abs: 1.8 10*3/uL (ref 0.7–4.0)
MCH: 30.8 pg (ref 26.0–34.0)
MCHC: 34.2 g/dL (ref 30.0–36.0)
MCV: 90 fL (ref 80.0–100.0)
Monocytes Absolute: 1.7 10*3/uL — ABNORMAL HIGH (ref 0.1–1.0)
Monocytes Relative: 14 %
Neutro Abs: 8.6 10*3/uL — ABNORMAL HIGH (ref 1.7–7.7)
Neutrophils Relative %: 70 %
Platelets: 199 10*3/uL (ref 150–400)
RBC: 3.9 MIL/uL — ABNORMAL LOW (ref 4.22–5.81)
RDW: 13.7 % (ref 11.5–15.5)
WBC: 12.3 10*3/uL — ABNORMAL HIGH (ref 4.0–10.5)
nRBC: 0 % (ref 0.0–0.2)

## 2020-03-05 LAB — COMPREHENSIVE METABOLIC PANEL
ALT: 39 U/L (ref 0–44)
AST: 22 U/L (ref 15–41)
Albumin: 2.5 g/dL — ABNORMAL LOW (ref 3.5–5.0)
Alkaline Phosphatase: 43 U/L (ref 38–126)
Anion gap: 9 (ref 5–15)
BUN: 29 mg/dL — ABNORMAL HIGH (ref 8–23)
CO2: 22 mmol/L (ref 22–32)
Calcium: 8.1 mg/dL — ABNORMAL LOW (ref 8.9–10.3)
Chloride: 105 mmol/L (ref 98–111)
Creatinine, Ser: 1.4 mg/dL — ABNORMAL HIGH (ref 0.61–1.24)
GFR, Estimated: 55 mL/min — ABNORMAL LOW (ref >60)
Glucose, Bld: 124 mg/dL — ABNORMAL HIGH (ref 70–99)
Potassium: 4.5 mmol/L (ref 3.5–5.1)
Sodium: 136 mmol/L (ref 135–145)
Total Bilirubin: 1 mg/dL (ref 0.3–1.2)
Total Protein: 5.8 g/dL — ABNORMAL LOW (ref 6.5–8.1)

## 2020-03-05 LAB — EXPECTORATED SPUTUM ASSESSMENT W GRAM STAIN, RFLX TO RESP C

## 2020-03-05 LAB — LACTATE DEHYDROGENASE: LDH: 182 U/L (ref 98–192)

## 2020-03-05 LAB — HEPARIN LEVEL (UNFRACTIONATED): Heparin Unfractionated: 0.53 IU/mL (ref 0.30–0.70)

## 2020-03-05 LAB — URINE CULTURE: Culture: NO GROWTH

## 2020-03-05 LAB — GLUCOSE, CAPILLARY
Glucose-Capillary: 94 mg/dL (ref 70–99)
Glucose-Capillary: 114 mg/dL — ABNORMAL HIGH (ref 70–99)
Glucose-Capillary: 144 mg/dL — ABNORMAL HIGH (ref 70–99)
Glucose-Capillary: 137 mg/dL — ABNORMAL HIGH (ref 70–99)

## 2020-03-05 LAB — MAGNESIUM: Magnesium: 2.1 mg/dL (ref 1.7–2.4)

## 2020-03-05 LAB — PROCALCITONIN: Procalcitonin: <0.1 ng/mL

## 2020-03-05 LAB — C-REACTIVE PROTEIN: CRP: 2.3 mg/dL — ABNORMAL HIGH (ref <1.0)

## 2020-03-05 LAB — PHOSPHORUS: Phosphorus: 3.1 mg/dL (ref 2.5–4.6)

## 2020-03-05 LAB — FERRITIN: Ferritin: 703 ng/mL — ABNORMAL HIGH (ref 24–336)

## 2020-03-05 MED ORDER — LACTATED RINGERS IV BOLUS
1000.0000 mL | Freq: Once | INTRAVENOUS | Status: AC
  Administered 2020-03-05: 1000 mL via INTRAVENOUS

## 2020-03-05 MED ORDER — APIXABAN 5 MG PO TABS
10.0000 mg | ORAL_TABLET | Freq: Two times a day (BID) | ORAL | Status: DC
  Administered 2020-03-05 – 2020-03-10 (×11): 10 mg via ORAL
  Filled 2020-03-05 (×11): qty 2

## 2020-03-05 MED ORDER — AMIODARONE HCL 200 MG PO TABS
200.0000 mg | ORAL_TABLET | Freq: Two times a day (BID) | ORAL | Status: DC
  Administered 2020-03-05 – 2020-03-10 (×11): 200 mg via ORAL
  Filled 2020-03-05 (×11): qty 1

## 2020-03-05 MED ORDER — APIXABAN 5 MG PO TABS: 5.0000 mg | ORAL_TABLET | Freq: Two times a day (BID) | ORAL | Status: DC

## 2020-03-05 MED ORDER — HEPARIN (PORCINE) 25000 UT/250ML-% IV SOLN
1200.0000 [IU]/h | INTRAVENOUS | Status: DC
  Administered 2020-03-05: 1200 [IU]/h via INTRAVENOUS

## 2020-03-05 NOTE — Progress Notes (Signed)
ANTICOAGULATION CONSULT NOTE  Pharmacy Consult for heparin infusion Indication: chest pain/ACS and presumed pulmonary embolus  Patient Measurements: Heparin Dosing Weight now 94.4 kg 12/11  Vital Signs: Temp: 98.2 F (36.8 C) (12/11 0400) Temp Source: Oral (12/11 0400) BP: 100/71 (12/11 0700) Pulse Rate: 67 (12/11 0700)  Labs: Recent Labs    03/03/20 0900 03/03/20 1027 03/03/20 1304 03/03/20 1323 03/03/20 1357 03/03/20 2320 03/03/20 2320 03/04/20 0411 03/04/20 0829 03/04/20 1652 03/04/20 2304 03/05/20 0400 03/05/20 0636  HGB  --   --  12.8*  --   --   --   --   --  13.5  --   --  12.0*  --   HCT  --   --  38.1*  --   --   --   --   --  40.2  --   --  35.1*  --   PLT  --   --  233  --   --   --   --   --  230  --   --  199  --   APTT  --   --   --  30  --   --   --   --   --   --   --   --   --   LABPROT  --   --   --  14.9  --   --   --   --   --   --   --   --   --   INR  --   --   --  1.2  --   --   --   --   --   --   --   --   --   HEPARINUNFRC  --   --   --   --   --  0.85*   < >  --  1.06* 0.69 0.71*  --  0.53  CREATININE 2.19*  --   --   --   --   --   --  1.78*  --   --   --  1.40*  --   TROPONINIHS 1,064* 1,145*  --   --  1,064* 1,477*  --   --   --   --   --   --   --    < > = values in this interval not displayed.    Estimated Creatinine Clearance: 59.5 mL/min (A) (by C-G formula based on SCr of 1.4 mg/dL (H)).   Medical History: Past Medical History:  Diagnosis Date  . Hypertension     Assessment: 69 y.o. Male recently admitted from 12/1 to 12/6 for COVID-19 Pneumonia, now presents with Acute Hypoxic Respiratory Failure and Septic Shock due to suspected Healthcare Associated Pneumonia and presumed Pulmonary Embolus. According to the completed Med Rec he is on no chronic anticoacoagulation except aspirin.  Baseline aPTT 30s, INR 1.2  CBC monitoring --Hgb 14.3 >> 12.8 >> 13.5 --Plt 265 >> 233 >> 230  Heparin course 12/9 at 1337 heparin 7500 unit  bolus x 1 and drip started at 1800 units/hr 12/9 at 2320 HL 0.85, heparin infusion decreased to 1600 units/hr 12/10 at 0829 HL 1.06, hold heparin infusion x 1 hour and decrease infusion rate to 1300 units/hr 12/10 @ 1652 HL 0.69, therapeutic s/p held x 1 hr and heparin rate reduced to 1300 units/hr. 12/10 @ 2304 HL 0.71, supratherapeutic, reduce rate to 1200 units/hr. 12/11 @ 0636  HL= 0.53 therapeutic. Will continue current rate of 1200 units/hr   Goal of Therapy:  Heparin level 0.3-0.7 units/ml Monitor platelets by anticoagulation protocol: Yes   Plan:  -- ContinueHeparin infusion to1200 units/hr --check confirmatory HL 6 hours  --Daily CBC per protocol while on heparin drip  Bari Mantis PharmD Clinical Pharmacist 03/05/2020

## 2020-03-05 NOTE — Progress Notes (Signed)
Pharmacy Antibiotic Note  Paul Mitchell is a 69 y.o. Male recently admitted from 12/1 to 12/6 for COVID-19 Pneumonia, now presents with Acute Hypoxic Respiratory Failure and Septic Shock due to suspected Healthcare Associated Pneumonia and presumed Pulmonary Embolus. Pharmacy has been consulted for cefepime dosing. His renal function on admission is significantly impaired compared to his apparent baseline level. -Hospitalized 12/1 - 12/6 for COVID-19 pneumonia   Plan: Day 3- cefepime 2 grams IV every 12 hours   Crcl 59.5 ml/min PCT 0.16> 0.18>0.10    Height: 6\' 2"  (188 cm) Weight: 94.4 kg (208 lb 1.8 oz) IBW/kg (Calculated) : 82.2  Temp (24hrs), Avg:98.2 F (36.8 C), Min:98 F (36.7 C), Max:98.5 F (36.9 C)  Recent Labs  Lab 02/28/20 0415 02/29/20 0431 03/03/20 0758 03/03/20 0900 03/03/20 1036 03/03/20 1250 03/03/20 1304 03/04/20 0411 03/04/20 0829 03/05/20 0400  WBC 6.5 8.7 17.8*  --   --   --  17.8*  --  16.3* 12.3*  CREATININE 1.12 1.21  --  2.19*  --   --   --  1.78*  --  1.40*  LATICACIDVEN  --   --   --   --  4.6* 2.8*  --   --   --   --     Estimated Creatinine Clearance: 59.5 mL/min (A) (by C-G formula based on SCr of 1.4 mg/dL (H)).    Allergies  Allergen Reactions  . Enalapril Cough    Antimicrobials this admission: vancomycin 12/9 x 1 dose cefepime 12/9 >>   Microbiology results: 12/9 BCx: NGx2d 12/9 SARS CoV-2: positive 12/9 influenza A/B: negative 12/9 Sputum: not acceptable 12/9 MRSA PCR:neg  Thank you for allowing pharmacy to be a part of this patient's care.  Earl Zellmer A 03/05/2020 8:09 AM

## 2020-03-05 NOTE — Progress Notes (Signed)
[facility]  SUBJECTIVE: Patient laying in bed, reports doing well, denies chest pain   Vitals:   03/05/20 0630 03/05/20 0645 03/05/20 0700 03/05/20 0800  BP: 100/66  100/71 103/72  Pulse: 64 76 67 65  Resp: 16 (!) 22 19 16   Temp:      TempSrc:      SpO2: 100% 100% 100% 100%  Weight:      Height:         Intake/Output Summary (Last 24 hours) at 03/05/2020 0916 Last data filed at 03/05/2020 14/01/2020 Gross per 24 hour  Intake 873.02 ml  Output 1650 ml  Net -776.98 ml      PHYSICAL EXAM  General: Well developed, well nourished, in no acute distress HEENT:  Normocephalic and atramatic Neck:  No JVD.  Lungs: Clear bilaterally to auscultation and percussion. Heart: HRRR . Normal S1 and S2 without gallops or murmurs.  Abdomen: Bowel sounds are positive, abdomen soft and non-tender  Msk:  Back normal, normal gait. Normal strength and tone for age. Extremities: No clubbing, cyanosis or edema.   Neuro: Alert and oriented X 3. Psych:  Good affect, responds appropriately   LABS: Basic Metabolic Panel: Recent Labs    03/04/20 0411 03/05/20 0400  NA 136 136  K 4.6 4.5  CL 107 105  CO2 19* 22  GLUCOSE 138* 124*  BUN 34* 29*  CREATININE 1.78* 1.40*  CALCIUM 7.9* 8.1*  MG 2.2 2.1  PHOS 3.6 3.1   Liver Function Tests: Recent Labs    03/04/20 0411 03/05/20 0400  AST 35 22  ALT 48* 39  ALKPHOS 42 43  BILITOT 1.1 1.0  PROT 6.0* 5.8*  ALBUMIN 2.6* 2.5*   No results for input(s): LIPASE, AMYLASE in the last 72 hours. CBC: Recent Labs    03/04/20 0829 03/05/20 0400  WBC 16.3* 12.3*  NEUTROABS 10.9* 8.6*  HGB 13.5 12.0*  HCT 40.2 35.1*  MCV 91.0 90.0  PLT 230 199   Cardiac Enzymes: No results for input(s): CKTOTAL, CKMB, CKMBINDEX, TROPONINI in the last 72 hours. BNP: Invalid input(s): POCBNP D-Dimer: No results for input(s): DDIMER in the last 72 hours. Hemoglobin A1C: Recent Labs    03/03/20 1304  HGBA1C 6.4*   Fasting Lipid Panel: No results  for input(s): CHOL, HDL, LDLCALC, TRIG, CHOLHDL, LDLDIRECT in the last 72 hours. Thyroid Function Tests: No results for input(s): TSH, T4TOTAL, T3FREE, THYROIDAB in the last 72 hours.  Invalid input(s): FREET3 Anemia Panel: Recent Labs    03/05/20 0400  FERRITIN 703*    14/11/21 Venous Img Lower Bilateral (DVT)  Result Date: 03/03/2020 CLINICAL DATA:  Elevated D-dimer EXAM: BILATERAL LOWER EXTREMITY VENOUS DOPPLER ULTRASOUND TECHNIQUE: Gray-scale sonography with compression, as well as color and duplex ultrasound, were performed to evaluate the deep venous system(s) from the level of the common femoral vein through the popliteal and proximal calf veins. COMPARISON:  None FINDINGS: VENOUS RIGHT lower extremity: Deep venous system is patent and compressible from the RIGHT common femoral vein through the RIGHT popliteal vein into visualized calf veins. Spontaneous venous flow is present with intact augmentation. No intraluminal thrombus identified. LEFT lower extremity: LEFT common femoral, profundus femoral, and femoral veins are patent and compressible. Spontaneous flow is present respiratory phasicity. No intraluminal thrombus identified. However, at the LEFT popliteal vein and extending into proximal LEFT calf veins nonocclusive intraluminal thrombus is identified consistent with deep venous thrombosis. Diminished spontaneous venous flow is seen and these veins are only partially compressible. OTHER None  Limitations: none IMPRESSION: Positive exam for deep venous thrombosis in the LEFT lower extremity involving the LEFT popliteal vein and proximal LEFT calf veins as above. No evidence of deep venous thrombosis in the RIGHT lower extremity. Electronically Signed   By: Ulyses Southward M.D.   On: 03/03/2020 18:02   DG Chest Port 1 View  Result Date: 03/03/2020 CLINICAL DATA:  Central line placement EXAM: PORTABLE CHEST 1 VIEW COMPARISON:  March 03, 2020 FINDINGS: The heart size and mediastinal contours are  mildly enlarged. Aortic knob calcifications are seen. A left-sided central venous catheter is seen with the tip just entering the brachiocephalic SVC junction. Multifocal patchy airspace opacity seen throughout both lungs. No acute osseous abnormality. IMPRESSION: Left-sided central venous catheter with the tip just entering the brachiocephalic/SVC junction. Unchanged multifocal airspace opacities Electronically Signed   By: Jonna Clark M.D.   On: 03/03/2020 19:23   ECHOCARDIOGRAM COMPLETE  Result Date: 03/04/2020    ECHOCARDIOGRAM REPORT   Patient Name:   Paul Mitchell Date of Exam: 03/04/2020 Medical Rec #:  [id number]       Height:       74.0 in Accession #:    [id number]      Weight:       220.7 lb Date of Birth:  1951-01-09      BSA:          2.266 m Patient Age:    69 years        BP:           98/71 mmHg Patient Gender: M               HR:           74 bpm. Exam Location:  [facility] Procedure: 2D Echo Indications:     Elevated Troponin  History:         Patient has no prior history of Echocardiogram examinations.                  Arrythmias:Atrial Fibrillation; Risk Factors:Hypertension.  Sonographer:     L Thornton-Maynard Referring Phys:  [id number] Judithe Modest Diagnosing Phys: Marcina Millard MD IMPRESSIONS  1. Left ventricular ejection fraction, by estimation, is 60 to 65%. The left ventricle has normal function. The left ventricle has no regional wall motion abnormalities. Left ventricular diastolic parameters are consistent with Grade I diastolic dysfunction (impaired relaxation).  2. Right ventricular systolic function is normal. The right ventricular size is normal. There is moderately elevated pulmonary artery systolic pressure.  3. The mitral valve is normal in structure. Mild to moderate mitral valve regurgitation. No evidence of mitral stenosis.  4. Tricuspid valve regurgitation is mild to moderate.  5. The aortic valve is normal in structure. Aortic valve regurgitation is not visualized.  No aortic stenosis is present.  6. The inferior vena cava is normal in size with greater than 50% respiratory variability, suggesting right atrial pressure of 3 mmHg. FINDINGS  Left Ventricle: Left ventricular ejection fraction, by estimation, is 60 to 65%. The left ventricle has normal function. The left ventricle has no regional wall motion abnormalities. The left ventricular internal cavity size was normal in size. There is  no left ventricular hypertrophy. Left ventricular diastolic parameters are consistent with Grade I diastolic dysfunction (impaired relaxation). Right Ventricle: The right ventricular size is normal. No increase in right ventricular wall thickness. Right ventricular systolic function is normal. There is moderately elevated pulmonary artery systolic pressure. The tricuspid regurgitant velocity is  3.00 m/s, and with an assumed right atrial pressure of 10 mmHg, the estimated right ventricular systolic pressure is 46.0 mmHg. Left Atrium: Left atrial size was normal in size. Right Atrium: Right atrial size was normal in size. Pericardium: There is no evidence of pericardial effusion. Mitral Valve: The mitral valve is normal in structure. Mild to moderate mitral valve regurgitation. No evidence of mitral valve stenosis. Tricuspid Valve: The tricuspid valve is normal in structure. Tricuspid valve regurgitation is mild to moderate. No evidence of tricuspid stenosis. Aortic Valve: The aortic valve is normal in structure. Aortic valve regurgitation is not visualized. No aortic stenosis is present. Aortic valve mean gradient measures 3.0 mmHg. Aortic valve peak gradient measures 4.0 mmHg. Aortic valve area, by VTI measures 4.72 cm. Pulmonic Valve: The pulmonic valve was normal in structure. Pulmonic valve regurgitation is not visualized. No evidence of pulmonic stenosis. Aorta: The aortic root is normal in size and structure. Venous: The inferior vena cava is normal in size with greater than 50%  respiratory variability, suggesting right atrial pressure of 3 mmHg. IAS/Shunts: No atrial level shunt detected by color flow Doppler.  LEFT VENTRICLE PLAX 2D LVIDd:         3.10 cm  Diastology LVIDs:         1.86 cm  LV e' medial:    5.22 cm/s LV PW:         1.43 cm  LV E/e' medial:  6.5 LV IVS:        1.87 cm  LV e' lateral:   7.94 cm/s LVOT diam:     2.70 cm  LV E/e' lateral: 4.3 LV SV:         86 LV SV Index:   38 LVOT Area:     5.73 cm  RIGHT VENTRICLE RV Basal diam:  5.65 cm RV S prime:     8.59 cm/s TAPSE (M-mode): 1.3 cm LEFT ATRIUM             Index LA diam:        2.20 cm 0.97 cm/m LA Vol (A2C):   48.1 ml 21.22 ml/m LA Vol (A4C):   42.4 ml 18.71 ml/m LA Biplane Vol: 46.6 ml 20.56 ml/m  AORTIC VALVE AV Area (Vmax):    5.09 cm AV Area (Vmean):   4.59 cm AV Area (VTI):     4.72 cm AV Vmax:           100.00 cm/s AV Vmean:          84.000 cm/s AV VTI:            0.183 m AV Peak Grad:      4.0 mmHg AV Mean Grad:      3.0 mmHg LVOT Vmax:         88.90 cm/s LVOT Vmean:        67.400 cm/s LVOT VTI:          0.151 m LVOT/AV VTI ratio: 0.83  AORTA Ao Root diam: 4.50 cm MV E velocity: 34.10 cm/s  TRICUSPID VALVE MV A velocity: 52.20 cm/s  TR Peak grad:   36.0 mmHg MV E/A ratio:  0.65        TR Vmax:        300.00 cm/s                             SHUNTS  Systemic VTI:  0.15 m                            Systemic Diam: 2.70 cm Marcina Millard MD Electronically signed by Marcina Millard MD Signature Date/Time: 03/04/2020/2:47:00 PM    Final      Echo LVEF 6065% with moderately elevated pulmonary artery systolic pressure  TELEMETRY: Sinus rhythm at 83 bpm:  ASSESSMENT AND PLAN:  Active Problems:   Acute respiratory failure with hypoxia (HCC)    1.  New onset atrial fibrillation, in the setting of acute hypoxic respiratory failure, septic shock in the setting of COVID-19 pneumonia, possible superimposed HCAP, and presumed pulmonary embolus with left lower extremity  DVT. The patient was started on heparin and amiodarone drips. Converted to sinus rhythm. 2D echocardiogram reveals normal left ventricular function with LVEF 60-65% with mild to moderate mitral and tricuspid regurgitation. 2. Septic shock due to COVID pneumonia and possible superimposed bacterial pneumonia, on vasopressors 3. Elevated troponin, secondary to demand supply ischemia due to the above 4. DVT, possible PE, on heparin drip 5. Acute hypoxic respiratory failure due to COVID pneumonia, possible HCAP  Recommendations  1.  Transition amiodarone drip to amiodarone 200 mg p.o. twice daily 2.  Transition heparin drip to p.o. Eliquis for DVT and angina stroke and nonvalvular atrial fibrillation   Marcina Millard, MD, PhD, [facility] 03/05/2020 9:16 AM

## 2020-03-05 NOTE — Progress Notes (Signed)
ANTICOAGULATION CONSULT NOTE  Pharmacy Consult for heparin infusion Indication: chest pain/ACS and presumed pulmonary embolus  Patient Measurements: Heparin Dosing Weight: 104.6  Vital Signs: BP: 105/74 (12/10 2030) Pulse Rate: 74 (12/10 2030)  Labs: Recent Labs    03/03/20 0758 03/03/20 0900 03/03/20 0900 03/03/20 1027 03/03/20 1304 03/03/20 1323 03/03/20 1357 03/03/20 2320 03/03/20 2320 03/04/20 0411 03/04/20 0829 03/04/20 1652 03/04/20 2304  HGB 14.3  --   --   --  12.8*  --   --   --   --   --  13.5  --   --   HCT 43.3  --   --   --  38.1*  --   --   --   --   --  40.2  --   --   PLT 265  --   --   --  233  --   --   --   --   --  230  --   --   APTT  --   --   --   --   --  30  --   --   --   --   --   --   --   LABPROT  --   --   --   --   --  14.9  --   --   --   --   --   --   --   INR  --   --   --   --   --  1.2  --   --   --   --   --   --   --   HEPARINUNFRC  --   --   --   --   --   --   --  0.85*   < >  --  1.06* 0.69 0.71*  CREATININE  --  2.19*  --   --   --   --   --   --   --  1.78*  --   --   --   TROPONINIHS  --  1,064*   < > 1,145*  --   --  1,064* 1,477*  --   --   --   --   --    < > = values in this interval not displayed.    Estimated Creatinine Clearance: 50.9 mL/min (A) (by C-G formula based on SCr of 1.78 mg/dL (H)).   Medical History: Past Medical History:  Diagnosis Date  . Hypertension     Assessment: 69 y.o. Male recently admitted from 12/1 to 12/6 for COVID-19 Pneumonia, now presents with Acute Hypoxic Respiratory Failure and Septic Shock due to suspected Healthcare Associated Pneumonia and presumed Pulmonary Embolus. According to the completed Med Rec he is on no chronic anticoacoagulation except aspirin.  Baseline aPTT 30s, INR 1.2  CBC monitoring --Hgb 14.3 >> 12.8 >> 13.5 --Plt 265 >> 233 >> 230  Heparin course 12/9 at 1337 heparin 7500 unit bolus x 1 and drip started at 1800 units/hr 12/9 at 2320 HL 0.85, heparin  infusion decreased to 1600 units/hr 12/10 at 0829 HL 1.06, hold heparin infusion x 1 hour and decrease infusion rate to 1300 units/hr 12/10 @ 1652 HL 0.69, therapeutic s/p held x 1 hr and heparin rate reduced to 1300 units/hr. 12/10 @ 2304 HL 0.71, supratherapeutic, reduce rate to 1200 units/hr.  Goal of Therapy:  Heparin level 0.3-0.7 units/ml Monitor platelets by anticoagulation  protocol: Yes   Plan:  -- Decrease Heparin infusion to1200 units/hr --Re-check HL 6 hours  --Daily CBC per protocol while on heparin drip  Otelia Sergeant, PharmD, [facility] 03/05/2020 1:02 AM

## 2020-03-06 DIAGNOSIS — I824Y2 Acute embolism and thrombosis of unspecified deep veins of left proximal lower extremity: Secondary | ICD-10-CM

## 2020-03-06 DIAGNOSIS — A419 Sepsis, unspecified organism: Secondary | ICD-10-CM

## 2020-03-06 DIAGNOSIS — R7989 Other specified abnormal findings of blood chemistry: Secondary | ICD-10-CM

## 2020-03-06 DIAGNOSIS — N179 Acute kidney failure, unspecified: Secondary | ICD-10-CM

## 2020-03-06 DIAGNOSIS — I4891 Unspecified atrial fibrillation: Secondary | ICD-10-CM

## 2020-03-06 LAB — CBC WITH DIFFERENTIAL/PLATELET
Abs Immature Granulocytes: 0.12 10*3/uL — ABNORMAL HIGH (ref 0.00–0.07)
Basophils Absolute: 0 10*3/uL (ref 0.0–0.1)
Basophils Relative: 0 %
Eosinophils Absolute: 0 10*3/uL (ref 0.0–0.5)
Eosinophils Relative: 0 %
HCT: 34.1 % — ABNORMAL LOW (ref 39.0–52.0)
Hemoglobin: 11.6 g/dL — ABNORMAL LOW (ref 13.0–17.0)
Immature Granulocytes: 1 %
Lymphocytes Relative: 16 %
Lymphs Abs: 1.9 10*3/uL (ref 0.7–4.0)
MCH: 31 pg (ref 26.0–34.0)
MCHC: 34 g/dL (ref 30.0–36.0)
MCV: 91.2 fL (ref 80.0–100.0)
Monocytes Absolute: 1.5 10*3/uL — ABNORMAL HIGH (ref 0.1–1.0)
Monocytes Relative: 13 %
Neutro Abs: 7.9 10*3/uL — ABNORMAL HIGH (ref 1.7–7.7)
Neutrophils Relative %: 70 %
Platelets: 191 10*3/uL (ref 150–400)
RBC: 3.74 MIL/uL — ABNORMAL LOW (ref 4.22–5.81)
RDW: 13.6 % (ref 11.5–15.5)
WBC: 11.4 10*3/uL — ABNORMAL HIGH (ref 4.0–10.5)
nRBC: 0 % (ref 0.0–0.2)

## 2020-03-06 LAB — PHOSPHORUS: Phosphorus: 3.5 mg/dL (ref 2.5–4.6)

## 2020-03-06 LAB — MAGNESIUM: Magnesium: 1.9 mg/dL (ref 1.7–2.4)

## 2020-03-06 LAB — COMPREHENSIVE METABOLIC PANEL
ALT: 32 U/L (ref 0–44)
AST: 15 U/L (ref 15–41)
Albumin: 2.4 g/dL — ABNORMAL LOW (ref 3.5–5.0)
Alkaline Phosphatase: 37 U/L — ABNORMAL LOW (ref 38–126)
Anion gap: 7 (ref 5–15)
BUN: 26 mg/dL — ABNORMAL HIGH (ref 8–23)
CO2: 25 mmol/L (ref 22–32)
Calcium: 8.3 mg/dL — ABNORMAL LOW (ref 8.9–10.3)
Chloride: 105 mmol/L (ref 98–111)
Creatinine, Ser: 1.13 mg/dL (ref 0.61–1.24)
GFR, Estimated: >60 mL/min (ref >60)
Glucose, Bld: 108 mg/dL — ABNORMAL HIGH (ref 70–99)
Potassium: 4.7 mmol/L (ref 3.5–5.1)
Sodium: 137 mmol/L (ref 135–145)
Total Bilirubin: 1.1 mg/dL (ref 0.3–1.2)
Total Protein: 5.6 g/dL — ABNORMAL LOW (ref 6.5–8.1)

## 2020-03-06 LAB — GLUCOSE, CAPILLARY
Glucose-Capillary: 90 mg/dL (ref 70–99)
Glucose-Capillary: 89 mg/dL (ref 70–99)
Glucose-Capillary: 143 mg/dL — ABNORMAL HIGH (ref 70–99)
Glucose-Capillary: 110 mg/dL — ABNORMAL HIGH (ref 70–99)

## 2020-03-06 LAB — FERRITIN: Ferritin: 580 ng/mL — ABNORMAL HIGH (ref 24–336)

## 2020-03-06 LAB — C-REACTIVE PROTEIN: CRP: 0.8 mg/dL (ref <1.0)

## 2020-03-06 LAB — LACTATE DEHYDROGENASE: LDH: 181 U/L (ref 98–192)

## 2020-03-06 LAB — FIBRIN DERIVATIVES D-DIMER (ARMC ONLY): Fibrin derivatives D-dimer (ARMC): 2556.44 ng/mL (FEU) — ABNORMAL HIGH (ref 0.00–499.00)

## 2020-03-06 MED ORDER — MIDODRINE HCL 5 MG PO TABS
5.0000 mg | ORAL_TABLET | Freq: Three times a day (TID) | ORAL | Status: DC
  Administered 2020-03-06 – 2020-03-10 (×12): 5 mg via ORAL
  Filled 2020-03-06 (×12): qty 1

## 2020-03-06 MED ORDER — LACTATED RINGERS IV SOLN: INTRAVENOUS | Status: DC

## 2020-03-06 MED ORDER — LACTATED RINGERS IV SOLN: INTRAVENOUS | Status: AC

## 2020-03-06 MED ORDER — SODIUM CHLORIDE 0.9 % IV SOLN
2.0000 g | Freq: Three times a day (TID) | INTRAVENOUS | Status: DC
  Administered 2020-03-06 – 2020-03-07 (×2): 2 g via INTRAVENOUS
  Filled 2020-03-06 (×4): qty 2

## 2020-03-06 NOTE — Progress Notes (Signed)
CRITICAL CARE NOTE  CC  follow up respiratory failure DVT with possible PE Recent COVID-19 pneumonia  SUBJECTIVE Doing much better today.  Less short of breath but continues to have cough with sputum no fever chills or rigors no abdominal pain.  No nausea vomiting diarrhea.  Tolerating diet no joint pain no rashes.  No headaches or seizures    SIGNIFICANT EVENTS None    BP 98/70   Pulse 79   Temp 98.3 F (36.8 C) (Axillary)   Resp 19   Ht  (1.88 m)   Wt 93.8 kg   SpO2 95%   BMI 26.55 kg/m    REVIEW OF SYSTEMS  PATIENT IS UNABLE TO PROVIDE COMPLETE REVIEW OF SYSTEMS DUE TO SEVERE CRITICAL ILLNESS   PHYSICAL EXAMINATION:  GENERAL:critically ill appearing, no resp distress HEAD: Normocephalic, atraumatic.  EYES: Pupils equal, round, reactive to light.  No scleral icterus.  MOUTH: Moist mucosal membrane. NECK: Supple. No thyromegaly. No nodules. No JVD.  PULMONARY: Rhonchi or wheezing  CARDIOVASCULAR: S1 and S2. Regular rate and rhythm. No murmurs, rubs, or gallops.  GASTROINTESTINAL: Soft, nontender, -distended. No masses. Positive bowel sounds. No hepatosplenomegaly.  MUSCULOSKELETAL: No swelling, clubbing, or edema.  NEUROLOGIC: Awake and alert nonfocal moves all extremities  SKIN:intact,warm,dry  INTAKE/OUTPUT  Intake/Output Summary (Last 24 hours) at 03/06/2020 1301 Last data filed at 03/06/2020 1201 Gross per 24 hour  Intake 358.49 ml  Output 2475 ml  Net -2116.51 ml    LABS  CBC Recent Labs  Lab 03/04/20 0829 03/05/20 0400 03/06/20 0425  WBC 16.3* 12.3* 11.4*  HGB 13.5 12.0* 11.6*  HCT 40.2 35.1* 34.1*  PLT 230 199 191   Coag's Recent Labs  Lab 03/03/20 1323  APTT 30  INR 1.2   BMET Recent Labs  Lab 03/04/20 0411 03/05/20 0400 03/06/20 0425  NA 136 136 137  K 4.6 4.5 4.7  CL 107 105 105  CO2 19* 22 25  BUN 34* 29* 26*  CREATININE 1.78* 1.40* 1.13  GLUCOSE 138* 124* 108*   Electrolytes Recent Labs  Lab 03/04/20 0411  03/05/20 0400 03/06/20 0425  CALCIUM 7.9* 8.1* 8.3*  MG 2.2 2.1 1.9  PHOS 3.6 3.1 3.5   Sepsis Markers Recent Labs  Lab 03/03/20 1036 03/03/20 1250 03/03/20 1304 03/04/20 0411 03/05/20 0400  LATICACIDVEN 4.6* 2.8*  --   --   --   PROCALCITON  --   --  0.16 0.18 <0.10   ABG No results for input(s): PHART, PCO2ART, PO2ART in the last 168 hours. Liver Enzymes Recent Labs  Lab 03/04/20 0411 03/05/20 0400 03/06/20 0425  AST 35 22 15  ALT 48* 39 32  ALKPHOS 42 43 37*  BILITOT 1.1 1.0 1.1  ALBUMIN 2.6* 2.5* 2.4*   Cardiac Enzymes No results for input(s): TROPONINI, PROBNP in the last 168 hours. Glucose Recent Labs  Lab 03/05/20 0835 03/05/20 1123 03/05/20 1605 03/05/20 2158 03/06/20 0815 03/06/20 1153  GLUCAP 94 114* 144* 137* 90 89     Recent Results (from the past 240 hour(s))  Resp Panel by RT-PCR (Flu A&B, Covid) Nasopharyngeal Swab     Status: Abnormal   Collection Time: 03/03/20  8:12 AM   Specimen: Nasopharyngeal Swab; Nasopharyngeal(NP) swabs in vial transport medium  Result Value Ref Range Status   SARS Coronavirus 2 by RT PCR POSITIVE (A) NEGATIVE Final    Comment: RESULT CALLED TO, READ BACK BY AND VERIFIED WITH: [facility] RN AT 0953 ON 03/03/20 SNG (NOTE) SARS-CoV-2  target nucleic acids are DETECTED.  The SARS-CoV-2 RNA is generally detectable in upper respiratory specimens during the acute phase of infection. Positive results are indicative of the presence of the identified virus, but do not rule out bacterial infection or co-infection with other pathogens not detected by the test. Clinical correlation with patient history and other diagnostic information is necessary to determine patient infection status. The expected result is Negative.  Fact Sheet for Patients: BloggerCourse.com  Fact Sheet for Healthcare Providers: SeriousBroker.it  This test is not yet approved or cleared by the  [place] FDA and  has been authorized for detection and/or diagnosis of SARS-CoV-2 by FDA under an Emergency Use Authorization (EUA).  This EUA will remain in effect (meaning this t est can be used) for the duration of  the COVID-19 declaration under Section 564(b)(1) of the Act, 21 U.S.C. section 360bbb-3(b)(1), unless the authorization is terminated or revoked sooner.     Influenza A by PCR NEGATIVE NEGATIVE Final   Influenza B by PCR NEGATIVE NEGATIVE Final    Comment: (NOTE) The Xpert Xpress SARS-CoV-2/FLU/RSV plus assay is intended as an aid in the diagnosis of influenza from Nasopharyngeal swab specimens and should not be used as a sole basis for treatment. Nasal washings and aspirates are unacceptable for Xpert Xpress SARS-CoV-2/FLU/RSV testing.  Fact Sheet for Patients: BloggerCourse.com  Fact Sheet for Healthcare Providers: SeriousBroker.it  This test is not yet approved or cleared by the [place] FDA and has been authorized for detection and/or diagnosis of SARS-CoV-2 by FDA under an Emergency Use Authorization (EUA). This EUA will remain in effect (meaning this test can be used) for the duration of the COVID-19 declaration under Section 564(b)(1) of the Act, 21 U.S.C. section 360bbb-3(b)(1), unless the authorization is terminated or revoked.  Performed at [facility], [address] [place], [place] [address]   Blood culture (routine x 2)     Status: None (Preliminary result)   Collection Time: 03/03/20 11:38 AM   Specimen: BLOOD  Result Value Ref Range Status   Specimen Description BLOOD BLOOD LEFT FOREARM  Final   Special Requests   Final    BOTTLES DRAWN AEROBIC AND ANAEROBIC Blood Culture results may not be optimal due to an inadequate volume of blood received in culture bottles   Culture   Final    NO GROWTH 3 DAYS Performed at [facility], [address], [place], [place]  [address]    Report Status PENDING  Incomplete  Blood culture (routine x 2)     Status: None (Preliminary result)   Collection Time: 03/03/20 11:38 AM   Specimen: BLOOD  Result Value Ref Range Status   Specimen Description BLOOD REJ  Final   Special Requests   Final    BOTTLES DRAWN AEROBIC AND ANAEROBIC Blood Culture results may not be optimal due to an inadequate volume of blood received in culture bottles   Culture   Final    NO GROWTH 3 DAYS Performed at [facility], [address] [place], [place] [address]    Report Status PENDING  Incomplete  MRSA PCR Screening     Status: None   Collection Time: 03/03/20  5:01 PM   Specimen: Nasopharyngeal  Result Value Ref Range Status   MRSA by PCR NEGATIVE NEGATIVE Final    Comment:        The GeneXpert MRSA Assay (FDA approved for NASAL specimens only), is one component of a comprehensive MRSA colonization surveillance program.  It is not intended to diagnose MRSA infection nor to guide or monitor treatment for MRSA infections. Performed at [facility], [address] [place], [place] [address]   Urine Culture     Status: None   Collection Time: 03/03/20  5:29 PM   Specimen: Urine, Random  Result Value Ref Range Status   Specimen Description   Final    URINE, RANDOM Performed at [facility], [address] [place], [place] [address]    Special Requests   Final    NONE Performed at [facility], [address], [place], [place] [address]    Culture   Final    NO GROWTH Performed at [facility], [address], [place], [place] [address]    Report Status 03/05/2020 FINAL  Final  Expectorated sputum assessment w rflx to resp cult     Status: None   Collection Time: 03/04/20  8:54 AM   Specimen: Expectorated Sputum  Result Value Ref Range Status   Specimen Description EXPECTORATED SPUTUM  Final   Special Requests NONE  Final   Sputum evaluation   Final    Sputum specimen  not acceptable for testing.  Please recollect.   RESULT CALLED TO, READ BACK BY AND VERIFIED WITH: R.CADY,RN AT 1402 ON 03/04/20 BY GM Performed at [facility], [address] [place], [place] [address]    Report Status 03/04/2020 FINAL  Final  Expectorated sputum assessment w rflx to resp cult     Status: None   Collection Time: 03/05/20 10:15 AM   Specimen: SPU  Result Value Ref Range Status   Specimen Description SPUTUM  Final   Special Requests NONE  Final   Sputum evaluation   Final    Sputum specimen not acceptable for testing.  Please recollect.   C/RUSSELL CADY AT 1301 03/05/20.PMF Performed at [facility], [address] [place], [place] [address]    Report Status 03/05/2020 FINAL  Final  Expectorated sputum assessment w rflx to resp cult     Status: None   Collection Time: 03/05/20  5:49 PM   Specimen: SPU  Result Value Ref Range Status   Specimen Description SPUTUM  Final   Special Requests NONE  Final   Sputum evaluation   Final    THIS SPECIMEN IS ACCEPTABLE FOR SPUTUM CULTURE Performed at [facility], [address], [place], [place] [address]    Report Status 03/05/2020 FINAL  Final  Culture, respiratory     Status: None (Preliminary result)   Collection Time: 03/05/20  5:49 PM   Specimen: SPU  Result Value Ref Range Status   Specimen Description   Final    SPUTUM Performed at [facility], [address], [place], [place] [address]    Special Requests   Final    NONE Reflexed from [id number] Performed at [facility], [address] [place], [place] [address]    Gram Stain   Final    RARE WBC PRESENT,BOTH PMN AND MONONUCLEAR FEW BUDDING YEAST SEEN RARE GRAM POSITIVE COCCI IN CLUSTERS RARE GRAM NEGATIVE COCCOBACILLI Performed at [facility], [address] [place], [place] [address]    Culture PENDING  Incomplete   Report Status PENDING  Incomplete    MEDICATIONS   Current  Facility-Administered Medications:  .  albuterol (VENTOLIN HFA) 108 (90 Base) MCG/ACT inhaler 2 puff, 2 puff, Inhalation, Q4H PRN, Judithe Modest, NP .  amiodarone (PACERONE) tablet  200 mg, 200 mg, Oral, BID, Paraschos, Alexander, MD, 200 mg at 03/06/20 0836 .  apixaban (ELIQUIS) tablet 10 mg, 10 mg, Oral, BID, 10 mg at 03/06/20 0836 **FOLLOWED BY** [START ON 03/12/2020] apixaban (ELIQUIS) tablet 5 mg, 5 mg, Oral, BID, Paraschos, Alexander, MD .  ascorbic acid (VITAMIN C) tablet 500 mg, 500 mg, Oral, Daily, Harlon Ditty D, NP, 500 mg at 03/06/20 0836 .  benzonatate (TESSALON) capsule 200 mg, 200 mg, Oral, BID PRN, Harlon Ditty D, NP .  ceFEPIme (MAXIPIME) 2 g in sodium chloride 0.9 % 100 mL IVPB, 2 g, Intravenous, Q12H, Lowella Bandy, RPH, Last Rate: 200 mL/hr at 03/06/20 1157, 2 g at 03/06/20 1157 .  chlorhexidine (PERIDEX) 0.12 % solution 15 mL, 15 mL, Mouth Rinse, BID, Kasa, Kurian, MD, 15 mL at 03/06/20 0839 .  Chlorhexidine Gluconate Cloth 2 % PADS 6 each, 6 each, Topical, Q0600, Erin Fulling, MD, 6 each at 03/06/20 0527 .  dexamethasone (DECADRON) injection 6 mg, 6 mg, Intravenous, Q24H, Harlon Ditty D, NP, 6 mg at 03/06/20 1217 .  docusate sodium (COLACE) capsule 100 mg, 100 mg, Oral, BID PRN, Harlon Ditty D, NP .  guaiFENesin-dextromethorphan (ROBITUSSIN DM) 100-10 MG/5ML syrup 15 mL, 15 mL, Oral, Q6H PRN, Harlon Ditty D, NP .  insulin aspart (novoLOG) injection 0-15 Units, 0-15 Units, Subcutaneous, TID AC & HS, Rust[place], Cecelia Byars, NP, 2 Units at 03/05/20 2216 .  lactated ringers infusion, , Intravenous, Continuous, Arbie Cookey, MD, Last Rate: 50 mL/hr at 03/06/20 1213, New Bag at 03/06/20 1213 .  MEDLINE mouth rinse, 15 mL, Mouth Rinse, q12n4p, Kasa, Kurian, MD, 15 mL at 03/06/20 1157 .  melatonin tablet 5 mg, 5 mg, Oral, QHS PRN, Eugenie Norrie, NP, 5 mg at 03/05/20 2200 .  norepinephrine (LEVOPHED) 16 mg in premix infusion, 0-40 mcg/min, Intravenous,  Titrated, Harlon Ditty D, NP, Last Rate: 1.88 mL/hr at 03/06/20 0700, 2 mcg/min at 03/06/20 0700 .  ondansetron (ZOFRAN) injection 4 mg, 4 mg, Intravenous, Q6H PRN, Erin Fulling, MD, 4 mg at 03/03/20 1715 .  polyethylene glycol (MIRALAX / GLYCOLAX) packet 17 g, 17 g, Oral, Daily PRN, Harlon Ditty D, NP .  zinc sulfate capsule 220 mg, 220 mg, Oral, Daily, Harlon Ditty D, NP, 220 mg at 03/06/20 8546      Indwelling Urinary Catheter continued, requirement due to   Reason to continue Indwelling Urinary Catheter for strict Intake/Output monitoring for hemodynamic instability   Central Line continued, requirement due to   Reason to continue [employer] Monitoring of central venous pressure or other hemodynamic parameters   Ventilator continued, requirement due to, resp failure    Ventilator Sedation RASS 0 to -2     ASSESSMENT AND PLAN SYNOPSIS   Acute Hypoxic Respiratory Failure in the setting of COVID-19 Pneumonia, questionable superimposed Healthcare Associated Pneumonia, and PRESUMED PULMONARY EMBOLUS -Supplemental O2 as needed to maintain SPO2 greater than 88% O2 requirement decreased to 4 L today -Follow intermittent chest x-ray and ABG as needed -Continue cefepime for HCAP coverage -Treating for presumed PE but documented DVT with Eliquis, unable to obtain CTA due to AKI (+ for LLE DVT) -As needed bronchodilators via MDI -Antitussives -Incentive spirometry and aggressive pulmonary hygiene -Self proning as tolerated -Maintain euvolemia to net negative fluid balance as able  Septic Shock +/- Obstructive Shock due to presumed PE Elevated Troponin, demand ischemia vs. NSTEMI Atrial Fibrillation w/ RVR>>has converted to NSR Left Lower Extremity DVT Remains dependent on low-dose Levophed, will  start maintenance fluids at 50 mL/h of 1 L only -Continuous cardiac monitoring -Maintain MAP greater than 65 -Continue low-dose Levophed, maintain a MAP greater than  65 -Continue Eliquis for treatment of presumed PE in the setting of LLE DVT -Continue amiodarone p.o. -Cardiology following, appreciate input -Vascular surgery consulted, appreciate input  Severe Sepsis secondary to COVID-19 Pneumonia and questionable superimposed Bacterial Pneumonia Previously completed course of Remdesivir and 5 day course of Baricitinib during his previous hospital admission from 02/24/20 to 02/29/20 -Monitor WBC/fever curve -Procalcitonin normalized- 12/11 -Monitor fever curve -Trend WBC's and Procalcitonin -Follow cultures -Continue cefepime -Cultures remain negative -Continue Decadron every 24 hours -Follow inflammatory markers: Ferritin, D-dimer, CRP, IL-6, LDH -Continue vitamin C and zinc -Maintain airborne and contact precautions  AKI>> continues to improve, cr nl today  - Strict I/O's: alert provider if UOP < 0.5 mL/kg/hr -Ensure adequate renal perfusion - Daily BMP, replace electrolytes PRN - Avoid nephrotoxic agents as able, ensure adequate renal perfusion  Hyperglycemia -Monitor CBG AC at bedtime -SSI AC at bedtime -Follow ICU hypo/hyper glycemia protocol  Best practice (evaluated daily)  Diet: Cardiac Pain/Anxiety/Delirium protocol (if indicated): N/A VAP protocol (if indicated): N/A DVT prophylaxis: bridged to Eliquis PO GI prophylaxis: N/A Glucose control: monitor Q 4, SSI Mobility: mobilize as tolerated last date of multidisciplinary goals of care discussion: 03/04/20 Family and staff present: Patient updated bedside, wife updated via telephone- 12/10. Summary of discussion: Transitioning patient from heparin drip to PO eliquis, continue plan of care Follow up goals of care discussion due: 03/06/20 Code Status: FULL Disposition: ICU   Critical Care Time devoted to patient care services described in this note is 35  minutes.   Overall, patient is critically ill, prognosis is guarded.  Patient with Multiorgan failure and at high risk  for cardiac arrest and death.   Arbie CookeyKhalid Jackie Russman, MD  03/06/2020 1:01 PM [facility]

## 2020-03-06 NOTE — Progress Notes (Signed)
[facility] Cardiology  SUBJECTIVE: Patient laying in bed with supplemental O2, denies chest pain or shortness of breath, does report abdominal left flank discomfort   Vitals:   03/06/20 0615 03/06/20 0630 03/06/20 0645 03/06/20 0700  BP: (!) 93/57 106/67 103/68 108/61  Pulse: (!) 54 (!) 57 65 (!) 59  Resp: 16 (!) 25 16 15   Temp:      TempSrc:      SpO2: 97% (!) 86% 96% 95%  Weight:      Height:         Intake/Output Summary (Last 24 hours) at 03/06/2020 0817 Last data filed at 03/06/2020 0700 Gross per 24 hour  Intake 358.49 ml  Output 2400 ml  Net -2041.51 ml      PHYSICAL EXAM  General: Well developed, well nourished, in no acute distress HEENT:  Normocephalic and atramatic Neck:  No JVD.  Lungs: Clear bilaterally to auscultation and percussion. Heart: HRRR . Normal S1 and S2 without gallops or murmurs.  Abdomen: Bowel sounds are positive, abdomen soft and non-tender  Msk:  Back normal, normal gait. Normal strength and tone for age. Extremities: No clubbing, cyanosis or edema.   Neuro: Alert and oriented X 3. Psych:  Good affect, responds appropriately   LABS: Basic Metabolic Panel: Recent Labs    03/05/20 0400 03/06/20 0425  NA 136 137  K 4.5 4.7  CL 105 105  CO2 22 25  GLUCOSE 124* 108*  BUN 29* 26*  CREATININE 1.40* 1.13  CALCIUM 8.1* 8.3*  MG 2.1 1.9  PHOS 3.1 3.5   Liver Function Tests: Recent Labs    03/05/20 0400 03/06/20 0425  AST 22 15  ALT 39 32  ALKPHOS 43 37*  BILITOT 1.0 1.1  PROT 5.8* 5.6*  ALBUMIN 2.5* 2.4*   No results for input(s): LIPASE, AMYLASE in the last 72 hours. CBC: Recent Labs    03/05/20 0400 03/06/20 0425  WBC 12.3* 11.4*  NEUTROABS 8.6* 7.9*  HGB 12.0* 11.6*  HCT 35.1* 34.1*  MCV 90.0 91.2  PLT 199 191   Cardiac Enzymes: No results for input(s): CKTOTAL, CKMB, CKMBINDEX, TROPONINI in the last 72 hours. BNP: Invalid input(s): POCBNP D-Dimer: No results for input(s): DDIMER in the last 72 hours. Hemoglobin  A1C: Recent Labs    03/03/20 1304  HGBA1C 6.4*   Fasting Lipid Panel: No results for input(s): CHOL, HDL, LDLCALC, TRIG, CHOLHDL, LDLDIRECT in the last 72 hours. Thyroid Function Tests: No results for input(s): TSH, T4TOTAL, T3FREE, THYROIDAB in the last 72 hours.  Invalid input(s): FREET3 Anemia Panel: Recent Labs    03/06/20 0425  FERRITIN 580*    ECHOCARDIOGRAM COMPLETE  Result Date: 03/04/2020    ECHOCARDIOGRAM REPORT   Patient Name:   Paul Mitchell Date of Exam: 03/04/2020 Medical Rec #:  [id number]       Height:       74.0 in Accession #:    [id number]      Weight:       220.7 lb Date of Birth:  07/16/1950      BSA:          2.266 m Patient Age:    69 years        BP:           98/71 mmHg Patient Gender: M               HR:           74 bpm. Exam Location:  [facility]  Procedure: 2D Echo Indications:     Elevated Troponin  History:         Patient has no prior history of Echocardiogram examinations.                  Arrythmias:Atrial Fibrillation; Risk Factors:Hypertension.  Sonographer:     L Thornton-Maynard Referring Phys:  [id number] Judithe Modest Diagnosing Phys: Marcina Millard MD IMPRESSIONS  1. Left ventricular ejection fraction, by estimation, is 60 to 65%. The left ventricle has normal function. The left ventricle has no regional wall motion abnormalities. Left ventricular diastolic parameters are consistent with Grade I diastolic dysfunction (impaired relaxation).  2. Right ventricular systolic function is normal. The right ventricular size is normal. There is moderately elevated pulmonary artery systolic pressure.  3. The mitral valve is normal in structure. Mild to moderate mitral valve regurgitation. No evidence of mitral stenosis.  4. Tricuspid valve regurgitation is mild to moderate.  5. The aortic valve is normal in structure. Aortic valve regurgitation is not visualized. No aortic stenosis is present.  6. The inferior vena cava is normal in size with greater than 50%  respiratory variability, suggesting right atrial pressure of 3 mmHg. FINDINGS  Left Ventricle: Left ventricular ejection fraction, by estimation, is 60 to 65%. The left ventricle has normal function. The left ventricle has no regional wall motion abnormalities. The left ventricular internal cavity size was normal in size. There is  no left ventricular hypertrophy. Left ventricular diastolic parameters are consistent with Grade I diastolic dysfunction (impaired relaxation). Right Ventricle: The right ventricular size is normal. No increase in right ventricular wall thickness. Right ventricular systolic function is normal. There is moderately elevated pulmonary artery systolic pressure. The tricuspid regurgitant velocity is 3.00 m/s, and with an assumed right atrial pressure of 10 mmHg, the estimated right ventricular systolic pressure is 46.0 mmHg. Left Atrium: Left atrial size was normal in size. Right Atrium: Right atrial size was normal in size. Pericardium: There is no evidence of pericardial effusion. Mitral Valve: The mitral valve is normal in structure. Mild to moderate mitral valve regurgitation. No evidence of mitral valve stenosis. Tricuspid Valve: The tricuspid valve is normal in structure. Tricuspid valve regurgitation is mild to moderate. No evidence of tricuspid stenosis. Aortic Valve: The aortic valve is normal in structure. Aortic valve regurgitation is not visualized. No aortic stenosis is present. Aortic valve mean gradient measures 3.0 mmHg. Aortic valve peak gradient measures 4.0 mmHg. Aortic valve area, by VTI measures 4.72 cm. Pulmonic Valve: The pulmonic valve was normal in structure. Pulmonic valve regurgitation is not visualized. No evidence of pulmonic stenosis. Aorta: The aortic root is normal in size and structure. Venous: The inferior vena cava is normal in size with greater than 50% respiratory variability, suggesting right atrial pressure of 3 mmHg. IAS/Shunts: No atrial level shunt  detected by color flow Doppler.  LEFT VENTRICLE PLAX 2D LVIDd:         3.10 cm  Diastology LVIDs:         1.86 cm  LV e' medial:    5.22 cm/s LV PW:         1.43 cm  LV E/e' medial:  6.5 LV IVS:        1.87 cm  LV e' lateral:   7.94 cm/s LVOT diam:     2.70 cm  LV E/e' lateral: 4.3 LV SV:         86 LV SV Index:   38 LVOT Area:  5.73 cm  RIGHT VENTRICLE RV Basal diam:  5.65 cm RV S prime:     8.59 cm/s TAPSE (M-mode): 1.3 cm LEFT ATRIUM             Index LA diam:        2.20 cm 0.97 cm/m LA Vol (A2C):   48.1 ml 21.22 ml/m LA Vol (A4C):   42.4 ml 18.71 ml/m LA Biplane Vol: 46.6 ml 20.56 ml/m  AORTIC VALVE AV Area (Vmax):    5.09 cm AV Area (Vmean):   4.59 cm AV Area (VTI):     4.72 cm AV Vmax:           100.00 cm/s AV Vmean:          84.000 cm/s AV VTI:            0.183 m AV Peak Grad:      4.0 mmHg AV Mean Grad:      3.0 mmHg LVOT Vmax:         88.90 cm/s LVOT Vmean:        67.400 cm/s LVOT VTI:          0.151 m LVOT/AV VTI ratio: 0.83  AORTA Ao Root diam: 4.50 cm MV E velocity: 34.10 cm/s  TRICUSPID VALVE MV A velocity: 52.20 cm/s  TR Peak grad:   36.0 mmHg MV E/A ratio:  0.65        TR Vmax:        300.00 cm/s                             SHUNTS                            Systemic VTI:  0.15 m                            Systemic Diam: 2.70 cm Marcina Millard MD Electronically signed by Marcina Millard MD Signature Date/Time: 03/04/2020/2:47:00 PM    Final      Echo LVEF 60 to 65% with moderately elevated pulmonary artery systolic pressure  TELEMETRY: Sinus rhythm:  ASSESSMENT AND PLAN:  Active Problems:   Acute respiratory failure with hypoxia (HCC)    1.  Paroxysmal atrial fibrillation, in the setting of acute hypoxic respiratory failure, septic shock, COVID-19 pneumonia, presumed pulmonary embolus with left lower extremity DVT, converted to sinus rhythm on Eliquis for stroke prevention, and transition to p.o. amiodarone for rate and rhythm control 2.  Elevated troponin (1064,  1145, 1064, 1477), without significant delta, without evidence for myocardial ischemia, most consistent with myocardial injury the setting of respiratory failure/septic shock/possible pulmonary embolus ( I5A ) 3.  Septic shock, due to Covid pneumonia, with possible superimposed bacterial pneumonia, on broad-spectrum antibiotics 4.  DVT, possible PE, on Eliquis 5.  Acute hypoxic respiratory failure, due to Covid pneumonia, supplemental O2 nasal cannula 6.  Chronic kidney disease  Recommendations  1.  Agree with current therapy 2.  Continue Eliquis for stroke prevention 3.  Continue amiodarone 200 mg twice daily 4.  Defer further cardiac diagnostics at this time 5.  Defer cardiac catheterization   Marcina Millard, MD, PhD, [facility] 03/06/2020 8:17 AM

## 2020-03-06 NOTE — Progress Notes (Signed)
Pharmacy Antibiotic Note  Paul Mitchell is a 69 y.o. Male recently admitted from 12/1 to 12/6 for COVID-19 Pneumonia, now presents with Acute Hypoxic Respiratory Failure and Septic Shock due to suspected Healthcare Associated Pneumonia and presumed Pulmonary Embolus. Pharmacy has been consulted for cefepime dosing. His renal function on admission is significantly impaired compared to his apparent baseline level. -Hospitalized 12/1 - 12/6 for COVID-19 pneumonia   Plan: Day 4-adjust cefepime 2 grams IV to every 8 hours   Crcl 73 ml/min PCT 0.16> 0.18>0.10    Height: 6\' 2"  (188 cm) Weight: 93.8 kg (206 lb 12.7 oz) IBW/kg (Calculated) : 82.2  Temp (24hrs), Avg:98.4 F (36.9 C), Min:98.2 F (36.8 C), Max:98.6 F (37 C)  Recent Labs  Lab 02/29/20 0431 03/03/20 0758 03/03/20 0900 03/03/20 1036 03/03/20 1250 03/03/20 1304 03/04/20 0411 03/04/20 0829 03/05/20 0400 03/06/20 0425  WBC 8.7 17.8*  --   --   --  17.8*  --  16.3* 12.3* 11.4*  CREATININE 1.21  --  2.19*  --   --   --  1.78*  --  1.40* 1.13  LATICACIDVEN  --   --   --  4.6* 2.8*  --   --   --   --   --     Estimated Creatinine Clearance: 73.8 mL/min (by C-G formula based on SCr of 1.13 mg/dL).    Allergies  Allergen Reactions  . Enalapril Cough    Antimicrobials this admission: vancomycin 12/9 x 1 dose cefepime 12/9 >>   Microbiology results: 12/9 BCx: NGx2d 12/9 SARS CoV-2: positive 12/9 influenza A/B: negative 12/9 Sputum: not acceptable 12/9 MRSA PCR:neg  Thank you for allowing pharmacy to be a part of this patient's care.  Paul Mitchell A 03/06/2020 5:24 PM

## 2020-03-06 NOTE — Plan of Care (Signed)
  Problem: Health Behavior/Discharge Planning: Goal: Ability to manage health-related needs will improve Outcome: Progressing   Problem: Clinical Measurements: Goal: Diagnostic test results will improve Outcome: Progressing Goal: Respiratory complications will improve Outcome: Progressing   Problem: Activity: Goal: Risk for activity intolerance will decrease Outcome: Progressing   Problem: Nutrition: Goal: Adequate nutrition will be maintained Outcome: Progressing   Problem: Elimination: Goal: Will not experience complications related to bowel motility Outcome: Progressing Goal: Will not experience complications related to urinary retention Outcome: Progressing

## 2020-03-06 NOTE — Progress Notes (Signed)
NAME:  Paul Mitchell, MRN:  [id number], DOB:  [birth year], LOS: 3 ADMISSION DATE:  03/03/2020, CONSULTATION DATE:  03/03/2020 REFERRING MD:  Dr. Vicente Males, CHIEF COMPLAINT:  Acute Respiratory Distress   Brief History   69 y.o. Male recently admitted from 02/24/20 to 02/29/20 for COVID-19  Pneumonia, now presents with Acute Hypoxic Respiratory Failure and Septic Shock due to COVID-19 Pneumonia, suspected Healthcare Associated Pneumonia, along with presumed Pulmonary Embolus and new onset Atrial Fibrillation with RVR.  Found to have left lower extremity DVT.  Past Medical History  Hypertension Recent COVID-19 Pneumonia (diagnosed 02/22/20, hospitalized 02/24/20 to 02/29/20)  Significant Hospital Events   12/9: PCCM asked to admit to ICU 12/9: + for LLE DVT, Vascular Surgery consulted 12/10: FiO2 requirement improved to 10L HFNC, reports improving respiratory status  Consults:  PCCM Cardiology Vascular Surgery  Procedures:  12/9: Left IJ CVC placed  Significant Diagnostic Tests:  12/9: Venous US BLE>>Positive exam for deep venous thrombosis in the LEFT lower extremity involving the LEFT popliteal vein and proximal LEFT calf veins as above. No evidence of deep venous thrombosis in the RIGHT lower extremity. 12/10: 2D Echocardiogram>> LVEF 60 to 65%, no wall motion abnormalities, moderately elevated pulmonary artery systolic pressure mild to moderate mitral & tricuspid regurgitation.  All elseWNL.  Micro Data:  12/9: COVID-19 PCR>> positive 12/9: Influenza PCR>> negative 12/9: MRSA PCR>>negative 12/9: Blood culture x2>>no growth to date 12/9: Sputum>> 12/9: Strep pneumo urinary antigen>>negative 12/9: Legionella urinary antigen>>  Antimicrobials:  Cefepime 12/9>> Vancomycin 12/9>>12/10  Interim history/subjective:  Patient lying in bed, alert and calm on 5 L HFNC. No complaints of pain or discomfort.  Labs/ Imaging personally reviewed Net: -1.5 L (+400 mL since admit) Na+/ K+:  136/ 4.5 BUN/Cr.: 29/ 1.40 Hgb: 12.0  WBC/ TMAX: 12.3/ 37.0 PCT: <0.10  Objective   Blood pressure 102/61, pulse 66, temperature 98.2 F (36.8 C), temperature source Oral, resp. rate (!) 27, height 6\' 2"  (1.88 m), weight 93.8 kg, SpO2 100 %.        Intake/Output Summary (Last 24 hours) at 03/06/2020 0455 Last data filed at 03/05/2020 2100 Gross per 24 hour  Intake 666.7 ml  Output 1800 ml  Net -1133.3 ml   Filed Weights   03/04/20 0431 03/05/20 0423 03/06/20 0447  Weight: 100.1 kg 94.4 kg 93.8 kg    Examination: General: Adult male, critically ill, lying in bed, NAD HEENT: MM pink/moist, anicteric, atraumatic, neck supple Neuro: *A&O x 4, following commands, PERRL +3, MAE CV: s1s2 RRR, NSR on monitor, no r/m/g Pulm: Regular, non labored on 5 L HFNC, breath sounds diminished throughout GI: soft, non tender, bs x 4 GU: foley in place  with clear yellow urine Skin: no rashes/lesions noted Extremities: warm/dry, pulses + 2 R/P,  Trace edema noted BLE  Resolved Hospital Problem list   N/A  Assessment & Plan:   Acute Hypoxic Respiratory Failure in the setting of COVID-19 Pneumonia, questionable superimposed Healthcare Associated Pneumonia, and PRESUMED PULMONARY EMBOLUS -Supplemental O2 as needed to maintain SPO2 greater than 88% -Follow intermittent chest x-ray and ABG as needed -Continue cefepime for HCAP coverage -Treating for presumed PE with Eliquis, unable to obtain CTA due to AKI (+ for LLE DVT) -As needed bronchodilators via MDI -Antitussives -Incentive spirometry and aggressive pulmonary hygiene -Self proning as tolerated -Maintain euvolemia to net negative fluid balance as able  Septic Shock +/- Obstructive Shock due to presumed PE Elevated Troponin, demand ischemia vs. NSTEMI Atrial Fibrillation w/ RVR>>has converted to  NSR Left Lower Extremity DVT Received IV fluid resuscitation in ED -Continuous cardiac monitoring -Maintain MAP greater than  65 -Continue low-dose Levophed, maintain a MAP greater than 65 -Continue Eliquis for treatment of presumed PE in the setting of LLE DVT -Continue amiodarone p.o. -Cardiology following, appreciate input -Vascular surgery consulted, appreciate input  Severe Sepsis secondary to COVID-19 Pneumonia and questionable superimposed Bacterial Pneumonia Previously completed course of Remdesivir and 5 day course of Baricitinib during his previous hospital admission from 02/24/20 to 02/29/20 -Monitor WBC/fever curve -Procalcitonin normalized- 12/11 -Monitor fever curve -Trend WBC's and Procalcitonin -Follow cultures -Continue cefepime -Follow cultures as above -Continue Cefepime, will d/c Vancomycin today -Continue Decadron every 24 hours -Follow inflammatory markers: Ferritin, D-dimer, CRP, IL-6, LDH -Continue vitamin C and zinc -Maintain airborne and contact precautions  AKI>> continues to improve - Strict I/O's: alert provider if UOP < 0.5 mL/kg/hr -Ensure adequate renal perfusion - Daily BMP, replace electrolytes PRN - Avoid nephrotoxic agents as able, ensure adequate renal perfusion  Hyperglycemia -Monitor CBG AC at bedtime -SSI AC at bedtime -Follow ICU hypo/hyper glycemia protocol  Best practice (evaluated daily)  Diet: Cardiac Pain/Anxiety/Delirium protocol (if indicated): N/A VAP protocol (if indicated): N/A DVT prophylaxis: bridged to Eliquis PO GI prophylaxis: N/A Glucose control: monitor Q 4, SSI Mobility: mobilize as tolerated last date of multidisciplinary goals of care discussion: 03/04/20 Family and staff present: Patient updated bedside, wife updated via telephone- 12/10. Summary of discussion: Transitioning patient from heparin drip to PO eliquis, continue plan of care Follow up goals of care discussion due: 03/06/20 Code Status: FULL Disposition: ICU  Labs   CBC: Recent Labs  Lab 02/29/20 0431 03/03/20 0758 03/03/20 1304 03/04/20 0829 03/05/20 0400  WBC  8.7 17.8* 17.8* 16.3* 12.3*  NEUTROABS 5.5  --  13.3* 10.9* 8.6*  HGB 12.0* 14.3 12.8* 13.5 12.0*  HCT 35.3* 43.3 38.1* 40.2 35.1*  MCV 89.4 92.5 90.7 91.0 90.0  PLT 237 265 233 230 199    Basic Metabolic Panel: Recent Labs  Lab 02/29/20 0431 03/03/20 0900 03/04/20 0411 03/05/20 0400  NA 137 135 136 136  K 4.3 4.6 4.6 4.5  CL 104 100 107 105  CO2 28 21* 19* 22  GLUCOSE 100* 228* 138* 124*  BUN 31* 34* 34* 29*  CREATININE 1.21 2.19* 1.78* 1.40*  CALCIUM 8.0* 8.3* 7.9* 8.1*  MG  --  2.4 2.2 2.1  PHOS  --   --  3.6 3.1   GFR: Estimated Creatinine Clearance: 59.5 mL/min (A) (by C-G formula based on SCr of 1.4 mg/dL (H)). Recent Labs  Lab 03/03/20 0758 03/03/20 1036 03/03/20 1250 03/03/20 1304 03/04/20 0411 03/04/20 0829 03/05/20 0400  PROCALCITON  --   --   --  0.16 0.18  --  <0.10  WBC 17.8*  --   --  17.8*  --  16.3* 12.3*  LATICACIDVEN  --  4.6* 2.8*  --   --   --   --     Liver Function Tests: Recent Labs  Lab 03/04/20 0411 03/05/20 0400  AST 35 22  ALT 48* 39  ALKPHOS 42 43  BILITOT 1.1 1.0  PROT 6.0* 5.8*  ALBUMIN 2.6* 2.5*   No results for input(s): LIPASE, AMYLASE in the last 168 hours. No results for input(s): AMMONIA in the last 168 hours.  ABG No results found for: PHART, PCO2ART, PO2ART, HCO3, TCO2, ACIDBASEDEF, O2SAT   Coagulation Profile: Recent Labs  Lab 03/03/20 1323  INR 1.2    Cardiac  Enzymes: No results for input(s): CKTOTAL, CKMB, CKMBINDEX, TROPONINI in the last 168 hours.  HbA1C: Hgb A1c MFr Bld  Date/Time Value Ref Range Status  03/03/2020 01:04 PM 6.4 (H) 4.8 - 5.6 % Final    Comment:    (NOTE) Pre diabetes:          5.7%-6.4%  Diabetes:              >6.4%  Glycemic control for   <7.0% adults with diabetes     CBG: Recent Labs  Lab 03/04/20 2135 03/05/20 0835 03/05/20 1123 03/05/20 1605 03/05/20 2158  GLUCAP 126* 94 114* 144* 137*     Critical care time: 35 minutes     Betsey Holiday,  AGACNP-BC Acute Care Nurse Practitioner [facility]   [phone] / [phone] Please see Amion for pager details.

## 2020-03-07 ENCOUNTER — Inpatient Hospital Stay: Payer: Managed Care, Other (non HMO)

## 2020-03-07 LAB — CBC WITH DIFFERENTIAL/PLATELET
Abs Immature Granulocytes: 0.09 10*3/uL — ABNORMAL HIGH (ref 0.00–0.07)
Basophils Absolute: 0 10*3/uL (ref 0.0–0.1)
Basophils Relative: 0 %
Eosinophils Absolute: 0 10*3/uL (ref 0.0–0.5)
Eosinophils Relative: 0 %
HCT: 33.7 % — ABNORMAL LOW (ref 39.0–52.0)
Hemoglobin: 11.1 g/dL — ABNORMAL LOW (ref 13.0–17.0)
Immature Granulocytes: 1 %
Lymphocytes Relative: 15 %
Lymphs Abs: 1.5 10*3/uL (ref 0.7–4.0)
MCH: 29.9 pg (ref 26.0–34.0)
MCHC: 32.9 g/dL (ref 30.0–36.0)
MCV: 90.8 fL (ref 80.0–100.0)
Monocytes Absolute: 1 10*3/uL (ref 0.1–1.0)
Monocytes Relative: 11 %
Neutro Abs: 7.1 10*3/uL (ref 1.7–7.7)
Neutrophils Relative %: 73 %
Platelets: 152 10*3/uL (ref 150–400)
RBC: 3.71 MIL/uL — ABNORMAL LOW (ref 4.22–5.81)
RDW: 13.2 % (ref 11.5–15.5)
WBC: 9.7 10*3/uL (ref 4.0–10.5)
nRBC: 0 % (ref 0.0–0.2)

## 2020-03-07 LAB — COMPREHENSIVE METABOLIC PANEL
ALT: 25 U/L (ref 0–44)
AST: 13 U/L — ABNORMAL LOW (ref 15–41)
Albumin: 2.4 g/dL — ABNORMAL LOW (ref 3.5–5.0)
Alkaline Phosphatase: 39 U/L (ref 38–126)
Anion gap: 6 (ref 5–15)
BUN: 26 mg/dL — ABNORMAL HIGH (ref 8–23)
CO2: 25 mmol/L (ref 22–32)
Calcium: 8.4 mg/dL — ABNORMAL LOW (ref 8.9–10.3)
Chloride: 105 mmol/L (ref 98–111)
Creatinine, Ser: 1.05 mg/dL (ref 0.61–1.24)
GFR, Estimated: >60 mL/min (ref >60)
Glucose, Bld: 109 mg/dL — ABNORMAL HIGH (ref 70–99)
Potassium: 4.6 mmol/L (ref 3.5–5.1)
Sodium: 136 mmol/L (ref 135–145)
Total Bilirubin: 0.8 mg/dL (ref 0.3–1.2)
Total Protein: 5.7 g/dL — ABNORMAL LOW (ref 6.5–8.1)

## 2020-03-07 LAB — LACTATE DEHYDROGENASE: LDH: 156 U/L (ref 98–192)

## 2020-03-07 LAB — GLUCOSE, CAPILLARY
Glucose-Capillary: 85 mg/dL (ref 70–99)
Glucose-Capillary: 95 mg/dL (ref 70–99)
Glucose-Capillary: 98 mg/dL (ref 70–99)
Glucose-Capillary: 143 mg/dL — ABNORMAL HIGH (ref 70–99)

## 2020-03-07 LAB — FERRITIN: Ferritin: 528 ng/mL — ABNORMAL HIGH (ref 24–336)

## 2020-03-07 LAB — MAGNESIUM: Magnesium: 2 mg/dL (ref 1.7–2.4)

## 2020-03-07 LAB — C-REACTIVE PROTEIN: CRP: 0.5 mg/dL (ref <1.0)

## 2020-03-07 LAB — FIBRIN DERIVATIVES D-DIMER (ARMC ONLY): Fibrin derivatives D-dimer (ARMC): 2559.27 ng/mL (FEU) — ABNORMAL HIGH (ref 0.00–499.00)

## 2020-03-07 LAB — PHOSPHORUS: Phosphorus: 4.2 mg/dL (ref 2.5–4.6)

## 2020-03-07 NOTE — Progress Notes (Signed)
[facility] Cardiology  SUBJECTIVE: Laying in bed with supplemental O2, denies chest pain or shortness of breath, continues to improve clinically   Vitals:   03/07/20 0330 03/07/20 0400 03/07/20 0500 03/07/20 0600  BP: 103/70 (!) 88/63 91/63 (!) 82/67  Pulse: (!) 50 60 (!) 56 (!) 57  Resp: 17 17 15 13   Temp:      TempSrc:      SpO2: 98% 98% 95% 98%  Weight:      Height:         Intake/Output Summary (Last 24 hours) at 03/07/2020 03/09/2020 Last data filed at 03/07/2020 0400 Gross per 24 hour  Intake 464.54 ml  Output 2275 ml  Net -1810.46 ml      PHYSICAL EXAM  General: Well developed, well nourished, in no acute distress HEENT:  Normocephalic and atramatic Neck:  No JVD.  Lungs: Clear bilaterally to auscultation and percussion. Heart: HRRR . Normal S1 and S2 without gallops or murmurs.  Abdomen: Bowel sounds are positive, abdomen soft and non-tender  Msk:  Back normal, normal gait. Normal strength and tone for age. Extremities: No clubbing, cyanosis or edema.   Neuro: Alert and oriented X 3. Psych:  Good affect, responds appropriately   LABS: Basic Metabolic Panel: Recent Labs    03/06/20 0425 03/07/20 0348  NA 137 136  K 4.7 4.6  CL 105 105  CO2 25 25  GLUCOSE 108* 109*  BUN 26* 26*  CREATININE 1.13 1.05  CALCIUM 8.3* 8.4*  MG 1.9 2.0  PHOS 3.5 4.2   Liver Function Tests: Recent Labs    03/06/20 0425 03/07/20 0348  AST 15 13*  ALT 32 25  ALKPHOS 37* 39  BILITOT 1.1 0.8  PROT 5.6* 5.7*  ALBUMIN 2.4* 2.4*   No results for input(s): LIPASE, AMYLASE in the last 72 hours. CBC: Recent Labs    03/06/20 0425 03/07/20 0348  WBC 11.4* 9.7  NEUTROABS 7.9* 7.1  HGB 11.6* 11.1*  HCT 34.1* 33.7*  MCV 91.2 90.8  PLT 191 152   Cardiac Enzymes: No results for input(s): CKTOTAL, CKMB, CKMBINDEX, TROPONINI in the last 72 hours. BNP: Invalid input(s): POCBNP D-Dimer: No results for input(s): DDIMER in the last 72 hours. Hemoglobin A1C: No results for  input(s): HGBA1C in the last 72 hours. Fasting Lipid Panel: No results for input(s): CHOL, HDL, LDLCALC, TRIG, CHOLHDL, LDLDIRECT in the last 72 hours. Thyroid Function Tests: No results for input(s): TSH, T4TOTAL, T3FREE, THYROIDAB in the last 72 hours.  Invalid input(s): FREET3 Anemia Panel: Recent Labs    03/07/20 0348  FERRITIN 528*    No results found.   Echo LVEF 60 to 65% with moderate elevated pulmonary pressures  TELEMETRY: Sinus rhythm:  ASSESSMENT AND PLAN:  Active Problems:   Acute respiratory failure with hypoxia (HCC)   Atrial fibrillation with rapid ventricular response (HCC)   Elevated d-dimer   Sepsis (HCC)   AKI (acute kidney injury) (HCC)   Acute deep vein thrombosis (DVT) of proximal vein of left lower extremity (HCC)    1. Paroxysmal atrial fibrillation, in the setting of acute hypoxic respiratory failure, septic shock, COVID-19 pneumonia, presumed pulmonary embolus with left lower extremity DVT, converted to sinus rhythm on Eliquis for stroke prevention, and transition to p.o. amiodarone for rate and rhythm control 2.  Elevated troponin (1064, 1145, 1064, 1477), without significant delta, without evidence for myocardial ischemia, most consistent with myocardial injury the setting of respiratory failure/septic shock/possible pulmonary embolus ( I5A ) 3.  Septic shock, due to Covid pneumonia, with possible superimposed bacterial pneumonia, on broad-spectrum antibiotics 4.  DVT, possible PE, on Eliquis 5.  Acute hypoxic respiratory failure, due to Covid pneumonia, supplemental O2 nasal cannula 6.  Chronic kidney disease  Recommendations  1.  Agree with current therapy 2.  Continue Eliquis for stroke prevention 3.  Continue amiodarone 200 mg twice daily 4.  Defer further cardiac diagnostics at this time 5.  Defer cardiac catheterization 6.  Follow-up with Dr. Gwen Pounds as outpatient   Paul Millard, MD, PhD, [facility] 03/07/2020 8:37 AM

## 2020-03-07 NOTE — Progress Notes (Signed)
Pt remains OFF levo overnight.  Pt sleeping well overnight. Pt in no acute distress @ this time. Will continue to monitor.

## 2020-03-07 NOTE — TOC Initial Note (Signed)
Transition of Care [facility]) - Initial/Assessment Note    Patient Details  Name: Paul Mitchell MRN: [id number] Date of Birth: 14-Sep-1950  [facility]) CM/SW Contact:    Allayne Butcher, RN Phone Number: 03/07/2020, 3:23 PM  Clinical Narrative:                 Patient recently discharge from the hospital with COVID, returned on 12/9 with afib in RVR and acute on chronic respiratory failure.  Patient is from home with his wife, declined home health services last admission.  Patient was discharged home with oxygen on 12/6 from [facility].  Patient is current with his PCP.  At discharge family will pick him up.  TOC will cont to follow and assist with discharge planning as needed.   Expected Discharge Plan: Home/Self Care Barriers to Discharge: Continued Medical Work up   Patient Goals and CMS Choice        Expected Discharge Plan and Services Expected Discharge Plan: Home/Self Care       Living arrangements for the past 2 months: Single Family Home                                      Prior Living Arrangements/Services Living arrangements for the past 2 months: Single Family Home Lives with:: Spouse Patient language and need for interpreter reviewed:: Yes Do you feel safe going back to the place where you live?: Yes      Need for Family Participation in Patient Care: Yes (Comment) (COVID) Care giver support system in place?: Yes (comment) (wife)   Criminal Activity/Legal Involvement Pertinent to Current Situation/Hospitalization: No - Comment as needed  Activities of Daily Living      Permission Sought/Granted Permission sought to share information with : Case Manager,Family Supports Permission granted to share information with : Yes, Verbal Permission Granted  Share Information with NAME: Mindi Junker     Permission granted to share info w Relationship: wife     Emotional Assessment       Orientation: : Oriented to Self,Oriented to Place,Oriented to   Time,Oriented to Situation Alcohol / Substance Use: Not Applicable Psych Involvement: No (comment)  Admission diagnosis:  Atrial fibrillation with rapid ventricular response (HCC) [I48.91] Acute respiratory failure with hypoxia (HCC) [J96.01] Elevated d-dimer [R79.89] Acute on chronic respiratory failure with hypoxia (HCC) [J96.21] Sepsis, due to unspecified organism, unspecified whether acute organ dysfunction present [facility]) [A41.9] Patient Active Problem List   Diagnosis Date Noted  . Atrial fibrillation with rapid ventricular response (HCC)   . Elevated d-dimer   . Sepsis (HCC)   . AKI (acute kidney injury) (HCC)   . Acute deep vein thrombosis (DVT) of proximal vein of left lower extremity (HCC)   . Acute respiratory failure with hypoxia (HCC) 03/03/2020  . Acute hypoxemic respiratory failure due to COVID-19 (HCC) 02/24/2020  . Essential hypertension 02/24/2020   PCP:  Mickey Farber, MD Pharmacy:   [facility] [address] ([address]), [place] - [address] [place]) [place] [address] Phone: [phone] Fax: [phone]     Social Determinants of Health (SDOH) Interventions    Readmission Risk Interventions No flowsheet data found.

## 2020-03-07 NOTE — Progress Notes (Signed)
CRITICAL CARE NOTE  CC  follow up respiratory failure DVT with possible PE Recent COVID-19 pneumonia  HPI slowly improving Less SOB minimal oxygen off pressors   BP 105/70   Pulse 84   Temp (!) 97.2 F (36.2 C)   Resp 18   Ht 6\' 2"  (1.88 m)   Wt 93.8 kg   SpO2 94%   BMI 26.55 kg/m    Review of Systems:  Gen:  Denies  fever, sweats, chills weight loss  HEENT: Denies blurred vision, double vision, ear pain, eye pain, hearing loss, nose bleeds, sore throat Cardiac:  No dizziness, chest pain or heaviness, chest tightness,edema, No JVD Resp:   No cough, -sputum production, +shortness of breath,-wheezing, -hemoptysis,  Gi: Denies swallowing difficulty, stomach pain, nausea or vomiting, diarrhea, constipation, bowel incontinence Other:  All other systems negative     Physical Examination:   General Appearance: No distress  Neuro:without focal findings,  speech normal,  HEENT: PERRLA, EOM intact.   Pulmonary: normal breath sounds, No wheezing.  CardiovascularNormal S1,S2.  No m/r/g.   Abdomen: Benign, Soft, non-tender. Renal:  No costovertebral tenderness  GU:  Not performed at this time. Endoc: No evident thyromegaly Skin:   warm, no rashes, no ecchymosis  Extremities: normal, no cyanosis, clubbing. PSYCHIATRIC: Mood, affect within normal limits.   ALL OTHER ROS ARE NEGATIVE  INTAKE/OUTPUT  Intake/Output Summary (Last 24 hours) at 03/07/2020 0923 Last data filed at 03/07/2020 0400 Gross per 24 hour  Intake 464.54 ml  Output 2000 ml  Net -1535.46 ml    LABS  CBC Recent Labs  Lab 03/05/20 0400 03/06/20 0425 03/07/20 0348  WBC 12.3* 11.4* 9.7  HGB 12.0* 11.6* 11.1*  HCT 35.1* 34.1* 33.7*  PLT 199 191 152   Coag's Recent Labs  Lab 03/03/20 1323  APTT 30  INR 1.2   BMET Recent Labs  Lab 03/05/20 0400 03/06/20 0425 03/07/20 0348  NA 136 137 136  K 4.5 4.7 4.6  CL 105 105 105  CO2 22 25 25   BUN 29* 26* 26*  CREATININE 1.40* 1.13 1.05   GLUCOSE 124* 108* 109*   Electrolytes Recent Labs  Lab 03/05/20 0400 03/06/20 0425 03/07/20 0348  CALCIUM 8.1* 8.3* 8.4*  MG 2.1 1.9 2.0  PHOS 3.1 3.5 4.2   Sepsis Markers Recent Labs  Lab 03/03/20 1036 03/03/20 1250 03/03/20 1304 03/04/20 0411 03/05/20 0400  LATICACIDVEN 4.6* 2.8*  --   --   --   PROCALCITON  --   --  0.16 0.18 <0.10   ABG No results for input(s): PHART, PCO2ART, PO2ART in the last 168 hours. Liver Enzymes Recent Labs  Lab 03/05/20 0400 03/06/20 0425 03/07/20 0348  AST 22 15 13*  ALT 39 32 25  ALKPHOS 43 37* 39  BILITOT 1.0 1.1 0.8  ALBUMIN 2.5* 2.4* 2.4*   Cardiac Enzymes No results for input(s): TROPONINI, PROBNP in the last 168 hours. Glucose Recent Labs  Lab 03/05/20 2158 03/06/20 0815 03/06/20 1153 03/06/20 1655 03/06/20 2109 03/07/20 0737  GLUCAP 137* 90 89 143* 110* 85     Recent Results (from the past 240 hour(s))  Resp Panel by RT-PCR (Flu A&B, Covid) Nasopharyngeal Swab     Status: Abnormal   Collection Time: 03/03/20  8:12 AM   Specimen: Nasopharyngeal Swab; Nasopharyngeal(NP) swabs in vial transport medium  Result Value Ref Range Status   SARS Coronavirus 2 by RT PCR POSITIVE (A) NEGATIVE Final    Comment: RESULT CALLED TO, READ BACK  BY AND VERIFIED WITH: [facility] RN AT 6599 ON 03/03/20 SNG (NOTE) SARS-CoV-2 target nucleic acids are DETECTED.  The SARS-CoV-2 RNA is generally detectable in upper respiratory specimens during the acute phase of infection. Positive results are indicative of the presence of the identified virus, but do not rule out bacterial infection or co-infection with other pathogens not detected by the test. Clinical correlation with patient history and other diagnostic information is necessary to determine patient infection status. The expected result is Negative.  Fact Sheet for Patients: BloggerCourse.com  Fact Sheet for Healthcare  Providers: SeriousBroker.it  This test is not yet approved or cleared by the [place] FDA and  has been authorized for detection and/or diagnosis of SARS-CoV-2 by FDA under an Emergency Use Authorization (EUA).  This EUA will remain in effect (meaning this t est can be used) for the duration of  the COVID-19 declaration under Section 564(b)(1) of the Act, 21 U.S.C. section 360bbb-3(b)(1), unless the authorization is terminated or revoked sooner.     Influenza A by PCR NEGATIVE NEGATIVE Final   Influenza B by PCR NEGATIVE NEGATIVE Final    Comment: (NOTE) The Xpert Xpress SARS-CoV-2/FLU/RSV plus assay is intended as an aid in the diagnosis of influenza from Nasopharyngeal swab specimens and should not be used as a sole basis for treatment. Nasal washings and aspirates are unacceptable for Xpert Xpress SARS-CoV-2/FLU/RSV testing.  Fact Sheet for Patients: BloggerCourse.com  Fact Sheet for Healthcare Providers: SeriousBroker.it  This test is not yet approved or cleared by the [place] FDA and has been authorized for detection and/or diagnosis of SARS-CoV-2 by FDA under an Emergency Use Authorization (EUA). This EUA will remain in effect (meaning this test can be used) for the duration of the COVID-19 declaration under Section 564(b)(1) of the Act, 21 U.S.C. section 360bbb-3(b)(1), unless the authorization is terminated or revoked.  Performed at [facility], [address] [place], [place] [address]   Blood culture (routine x 2)     Status: None (Preliminary result)   Collection Time: 03/03/20 11:38 AM   Specimen: BLOOD  Result Value Ref Range Status   Specimen Description BLOOD BLOOD LEFT FOREARM  Final   Special Requests   Final    BOTTLES DRAWN AEROBIC AND ANAEROBIC Blood Culture results may not be optimal due to an inadequate volume of blood received in culture bottles    Culture   Final    NO GROWTH 4 DAYS Performed at [facility], [address], [place], [place] [address]    Report Status PENDING  Incomplete  Blood culture (routine x 2)     Status: None (Preliminary result)   Collection Time: 03/03/20 11:38 AM   Specimen: BLOOD  Result Value Ref Range Status   Specimen Description BLOOD REJ  Final   Special Requests   Final    BOTTLES DRAWN AEROBIC AND ANAEROBIC Blood Culture results may not be optimal due to an inadequate volume of blood received in culture bottles   Culture   Final    NO GROWTH 4 DAYS Performed at [facility], [address], [place], [place] [address]    Report Status PENDING  Incomplete  MRSA PCR Screening     Status: None   Collection Time: 03/03/20  5:01 PM   Specimen: Nasopharyngeal  Result Value Ref Range Status   MRSA by PCR NEGATIVE NEGATIVE Final    Comment:        The GeneXpert MRSA Assay (FDA approved  for NASAL specimens only), is one component of a comprehensive MRSA colonization surveillance program. It is not intended to diagnose MRSA infection nor to guide or monitor treatment for MRSA infections. Performed at [facility], [address] [place], [place] [address]   Urine Culture     Status: None   Collection Time: 03/03/20  5:29 PM   Specimen: Urine, Random  Result Value Ref Range Status   Specimen Description   Final    URINE, RANDOM Performed at [facility], [address] [place], [place] [address]    Special Requests   Final    NONE Performed at [facility], [address] [place], [place] [address]    Culture   Final    NO GROWTH Performed at [facility], [address] [place], [place] [address]    Report Status 03/05/2020 FINAL  Final  Expectorated sputum assessment w rflx to resp cult     Status: None   Collection Time: 03/04/20  8:54 AM   Specimen: Expectorated Sputum  Result Value Ref Range Status   Specimen  Description EXPECTORATED SPUTUM  Final   Special Requests NONE  Final   Sputum evaluation   Final    Sputum specimen not acceptable for testing.  Please recollect.   RESULT CALLED TO, READ BACK BY AND VERIFIED WITH: R.CADY,RN AT 1402 ON 03/04/20 BY GM Performed at [facility], [address] [place], [place] [address]    Report Status 03/04/2020 FINAL  Final  Expectorated sputum assessment w rflx to resp cult     Status: None   Collection Time: 03/05/20 10:15 AM   Specimen: SPU  Result Value Ref Range Status   Specimen Description SPUTUM  Final   Special Requests NONE  Final   Sputum evaluation   Final    Sputum specimen not acceptable for testing.  Please recollect.   C/RUSSELL CADY AT 1301 03/05/20.PMF Performed at [facility], [address] [place], [place] [address]    Report Status 03/05/2020 FINAL  Final  Expectorated sputum assessment w rflx to resp cult     Status: None   Collection Time: 03/05/20  5:49 PM   Specimen: SPU  Result Value Ref Range Status   Specimen Description SPUTUM  Final   Special Requests NONE  Final   Sputum evaluation   Final    THIS SPECIMEN IS ACCEPTABLE FOR SPUTUM CULTURE Performed at [facility], [address] [place], [place] [address]    Report Status 03/05/2020 FINAL  Final  Culture, respiratory     Status: None (Preliminary result)   Collection Time: 03/05/20  5:49 PM   Specimen: SPU  Result Value Ref Range Status   Specimen Description   Final    SPUTUM Performed at [facility], [address] [place], [place] [address]    Special Requests   Final    NONE Reflexed from [id number] Performed at [facility], [address] [place], [place] [address]    Gram Stain   Final    RARE WBC PRESENT,BOTH PMN AND MONONUCLEAR FEW BUDDING YEAST SEEN RARE GRAM POSITIVE COCCI IN CLUSTERS RARE GRAM NEGATIVE COCCOBACILLI Performed at [facility], [address] [place], [place]  [address]    Culture PENDING  Incomplete   Report Status PENDING  Incomplete    MEDICATIONS   Current Facility-Administered Medications:  .  albuterol (VENTOLIN HFA) 108 (90 Base) MCG/ACT inhaler 2 puff,  2 puff, Inhalation, Q4H PRN, Harlon Ditty D, NP .  amiodarone (PACERONE) tablet 200 mg, 200 mg, Oral, BID, Paraschos, Alexander, MD, 200 mg at 03/07/20 6045 .  apixaban (ELIQUIS) tablet 10 mg, 10 mg, Oral, BID, 10 mg at 03/07/20 0831 **FOLLOWED BY** [START ON 03/12/2020] apixaban (ELIQUIS) tablet 5 mg, 5 mg, Oral, BID, Paraschos, Alexander, MD .  ascorbic acid (VITAMIN C) tablet 500 mg, 500 mg, Oral, Daily, Harlon Ditty D, NP, 500 mg at 03/07/20 4098 .  benzonatate (TESSALON) capsule 200 mg, 200 mg, Oral, BID PRN, Harlon Ditty D, NP .  ceFEPIme (MAXIPIME) 2 g in sodium chloride 0.9 % 100 mL IVPB, 2 g, Intravenous, Q8H, Ulice Follett, MD, Last Rate: 200 mL/hr at 03/07/20 0610, 2 g at 03/07/20 0610 .  chlorhexidine (PERIDEX) 0.12 % solution 15 mL, 15 mL, Mouth Rinse, BID, Keyatta Tolles, MD, 15 mL at 03/07/20 0831 .  Chlorhexidine Gluconate Cloth 2 % PADS 6 each, 6 each, Topical, Q0600, Erin Fulling, MD, 6 each at 03/07/20 [id number] .  dexamethasone (DECADRON) injection 6 mg, 6 mg, Intravenous, Q24H, Harlon Ditty D, NP, 6 mg at 03/06/20 1217 .  docusate sodium (COLACE) capsule 100 mg, 100 mg, Oral, BID PRN, Harlon Ditty D, NP .  guaiFENesin-dextromethorphan (ROBITUSSIN DM) 100-10 MG/5ML syrup 15 mL, 15 mL, Oral, Q6H PRN, Harlon Ditty D, NP .  insulin aspart (novoLOG) injection 0-15 Units, 0-15 Units, Subcutaneous, TID AC & HS, Rust-Chester, Britton L, NP, 2 Units at 03/06/20 1700 .  lactated ringers infusion, , Intravenous, Continuous, Rust-Chester, Cecelia Byars, NP, Stopped at 03/07/20 0508 .  MEDLINE mouth rinse, 15 mL, Mouth Rinse, q12n4p, Jerlisa Diliberto, MD, 15 mL at 03/06/20 1157 .  melatonin tablet 5 mg, 5 mg, Oral, QHS PRN, Eugenie Norrie, NP, 5 mg at 03/06/20 2125 .  midodrine  (PROAMATINE) tablet 5 mg, 5 mg, Oral, TID WC, Arbie Cookey, MD, 5 mg at 03/07/20 0831 .  norepinephrine (LEVOPHED) 16 mg in premix infusion, 0-40 mcg/min, Intravenous, Titrated, Judithe Modest, NP, Stopped at 03/06/20 1240 .  ondansetron (ZOFRAN) injection 4 mg, 4 mg, Intravenous, Q6H PRN, Erin Fulling, MD, 4 mg at 03/03/20 1715 .  polyethylene glycol (MIRALAX / GLYCOLAX) packet 17 g, 17 g, Oral, Daily PRN, Harlon Ditty D, NP .  zinc sulfate capsule 220 mg, 220 mg, Oral, Daily, Harlon Ditty D, NP, 220 mg at 03/07/20 4782     ASSESSMENT AND PLAN SYNOPSIS   Acute Hypoxic Respiratory Failure in the setting of COVID-19 Pneumonia, questionable superimposed Healthcare Associated Pneumonia, and PRESUMED PULMONARY EMBOLUS Slowly improving -Treating for presumed PE but documented DVT with Eliquis, unable to obtain CTA due to AKI (+ for LLE DVT) -As needed bronchodilators via MDI -Antitussives -Incentive spirometry and aggressive pulmonary hygiene -Self proning as tolerated -Maintain euvolemia to net negative fluid balance as able  Septic Shock +/- Obstructive Shock due to presumed PE Elevated Troponin, demand ischemia vs. NSTEMI Atrial Fibrillation w/ RVR>>has converted to NSR Left Lower Extremity DVT Remains dependent on low-dose Levophed, will start maintenance fluids at 50 mL/h of 1 L only -improved off pressors  Severe Sepsis secondary to COVID-19 Pneumonia and questionable superimposed Bacterial Pneumonia abx as needed   Transfer to [facility] service    Wallis Bamberg Santiago Glad, M.D.  [facility]  Medical Director [facility] Medical Director [facility] Cardio-Pulmonary Department

## 2020-03-07 NOTE — Progress Notes (Signed)
Patient alert and oriented, no complaints of pain or shortness of breath. Removed patients foley catheter this am. Up in chair. Patient being transferred to floor. Patient covid positive.

## 2020-03-08 LAB — CULTURE, RESPIRATORY W GRAM STAIN

## 2020-03-08 LAB — CBC WITH DIFFERENTIAL/PLATELET
Abs Immature Granulocytes: 0.1 10*3/uL — ABNORMAL HIGH (ref 0.00–0.07)
Basophils Absolute: 0 10*3/uL (ref 0.0–0.1)
Basophils Relative: 0 %
Eosinophils Absolute: 0 10*3/uL (ref 0.0–0.5)
Eosinophils Relative: 0 %
HCT: 39.2 % (ref 39.0–52.0)
Hemoglobin: 13.3 g/dL (ref 13.0–17.0)
Immature Granulocytes: 1 %
Lymphocytes Relative: 14 %
Lymphs Abs: 1.5 10*3/uL (ref 0.7–4.0)
MCH: 30.4 pg (ref 26.0–34.0)
MCHC: 33.9 g/dL (ref 30.0–36.0)
MCV: 89.7 fL (ref 80.0–100.0)
Monocytes Absolute: 0.7 10*3/uL (ref 0.1–1.0)
Monocytes Relative: 7 %
Neutro Abs: 8 10*3/uL — ABNORMAL HIGH (ref 1.7–7.7)
Neutrophils Relative %: 78 %
Platelets: 153 10*3/uL (ref 150–400)
RBC: 4.37 MIL/uL (ref 4.22–5.81)
RDW: 13.4 % (ref 11.5–15.5)
WBC: 10.3 10*3/uL (ref 4.0–10.5)
nRBC: 0 % (ref 0.0–0.2)

## 2020-03-08 LAB — COMPREHENSIVE METABOLIC PANEL
ALT: 27 U/L (ref 0–44)
AST: 19 U/L (ref 15–41)
Albumin: 2.8 g/dL — ABNORMAL LOW (ref 3.5–5.0)
Alkaline Phosphatase: 44 U/L (ref 38–126)
Anion gap: 8 (ref 5–15)
BUN: 28 mg/dL — ABNORMAL HIGH (ref 8–23)
CO2: 27 mmol/L (ref 22–32)
Calcium: 9 mg/dL (ref 8.9–10.3)
Chloride: 102 mmol/L (ref 98–111)
Creatinine, Ser: 1.1 mg/dL (ref 0.61–1.24)
GFR, Estimated: >60 mL/min (ref >60)
Glucose, Bld: 151 mg/dL — ABNORMAL HIGH (ref 70–99)
Potassium: 5.1 mmol/L (ref 3.5–5.1)
Sodium: 137 mmol/L (ref 135–145)
Total Bilirubin: 0.9 mg/dL (ref 0.3–1.2)
Total Protein: 6.4 g/dL — ABNORMAL LOW (ref 6.5–8.1)

## 2020-03-08 LAB — GLUCOSE, CAPILLARY
Glucose-Capillary: 103 mg/dL — ABNORMAL HIGH (ref 70–99)
Glucose-Capillary: 110 mg/dL — ABNORMAL HIGH (ref 70–99)
Glucose-Capillary: 149 mg/dL — ABNORMAL HIGH (ref 70–99)
Glucose-Capillary: 150 mg/dL — ABNORMAL HIGH (ref 70–99)

## 2020-03-08 LAB — CULTURE, BLOOD (ROUTINE X 2)
Culture: NO GROWTH
Culture: NO GROWTH

## 2020-03-08 LAB — MAGNESIUM: Magnesium: 1.9 mg/dL (ref 1.7–2.4)

## 2020-03-08 LAB — FIBRIN DERIVATIVES D-DIMER (ARMC ONLY): Fibrin derivatives D-dimer (ARMC): 2790.63 ng/mL (FEU) — ABNORMAL HIGH (ref 0.00–499.00)

## 2020-03-08 LAB — FERRITIN: Ferritin: 497 ng/mL — ABNORMAL HIGH (ref 24–336)

## 2020-03-08 LAB — C-REACTIVE PROTEIN: CRP: 0.6 mg/dL (ref <1.0)

## 2020-03-08 LAB — PHOSPHORUS: Phosphorus: 4 mg/dL (ref 2.5–4.6)

## 2020-03-08 LAB — LACTATE DEHYDROGENASE: LDH: 182 U/L (ref 98–192)

## 2020-03-08 MED ORDER — DEXAMETHASONE 4 MG PO TABS
6.0000 mg | ORAL_TABLET | Freq: Every day | ORAL | Status: DC
  Administered 2020-03-08 – 2020-03-10 (×3): 6 mg via ORAL
  Filled 2020-03-08 (×3): qty 2

## 2020-03-08 NOTE — Progress Notes (Signed)
[facility]    SUBJECTIVE: The patient reports feeling well with no complaints this morning. He denies chest pain, shortness of breath at rest and on supplemental O2, or palpitations.    Vitals:   03/08/20 0159 03/08/20 0407 03/08/20 0500 03/08/20 0817  BP: 94/66 101/69  108/75  Pulse: 65 66  75  Resp: 16 18  19   Temp: 97.9 F (36.6 C) 97.7 F (36.5 C)  98.6 F (37 C)  TempSrc: Axillary Axillary    SpO2: 96% 97%  97%  Weight:   95 kg   Height:         Intake/Output Summary (Last 24 hours) at 03/08/2020 0933 Last data filed at 03/08/2020 0100 Gross per 24 hour  Intake --  Output 1025 ml  Net -1025 ml      PHYSICAL EXAM  General: Well developed, well nourished, in no acute distress, lying in bed in no acute distress HEENT:  Normocephalic and atramatic Neck:  No JVD. IJ in place Lungs: normal effort of breathing on supplemental O2 via [place], no wheezing, speaking in full sentences. Heart: HRRR . Normal S1 and S2 without gallops or murmurs.  Abdomen: nondistended Msk:  Normal strength and tone for age. Extremities: No clubbing, cyanosis or edema.   Neuro: Alert and oriented X 3. Psych:  Good affect, responds appropriately   LABS: Basic Metabolic Panel: Recent Labs    03/06/20 0425 03/07/20 0348  NA 137 136  K 4.7 4.6  CL 105 105  CO2 25 25  GLUCOSE 108* 109*  BUN 26* 26*  CREATININE 1.13 1.05  CALCIUM 8.3* 8.4*  MG 1.9 2.0  PHOS 3.5 4.2   Liver Function Tests: Recent Labs    03/06/20 0425 03/07/20 0348  AST 15 13*  ALT 32 25  ALKPHOS 37* 39  BILITOT 1.1 0.8  PROT 5.6* 5.7*  ALBUMIN 2.4* 2.4*   No results for input(s): LIPASE, AMYLASE in the last 72 hours. CBC: Recent Labs    03/07/20 0348 03/08/20 0842  WBC 9.7 10.3  NEUTROABS 7.1 8.0*  HGB 11.1* 13.3  HCT 33.7* 39.2  MCV 90.8 89.7  PLT 152 153   Cardiac Enzymes: No results for input(s): CKTOTAL, CKMB, CKMBINDEX, TROPONINI in the last 72 hours. BNP: Invalid input(s):  POCBNP D-Dimer: No results for input(s): DDIMER in the last 72 hours. Hemoglobin A1C: No results for input(s): HGBA1C in the last 72 hours. Fasting Lipid Panel: No results for input(s): CHOL, HDL, LDLCALC, TRIG, CHOLHDL, LDLDIRECT in the last 72 hours. Thyroid Function Tests: No results for input(s): TSH, T4TOTAL, T3FREE, THYROIDAB in the last 72 hours.  Invalid input(s): FREET3 Anemia Panel: Recent Labs    03/07/20 0348  FERRITIN 528*    DG Chest Port 1 View  Result Date: 03/07/2020 CLINICAL DATA:  COVID-19 positivity with shortness of breath EXAM: PORTABLE CHEST 1 VIEW COMPARISON:  03/03/2020 FINDINGS: Cardiac shadow is stable. Patchy airspace opacities are again identified bilaterally consistent with the given clinical history. Left-sided jugular central line is again noted and stable. No bony abnormality is seen. IMPRESSION: Stable airspace opacities consistent with the given clinical history. Electronically Signed   By: 14/11/2019 M.D.   On: 03/07/2020 09:20     Echo LVEF 60-65%, no regional wall motion abnormalities, mild to moderate MR and TR  TELEMETRY: sinus rhythm  ASSESSMENT AND PLAN:  Active Problems:   Acute respiratory failure with hypoxia (HCC)   Atrial fibrillation with rapid ventricular response (HCC)   Elevated d-dimer  Sepsis (HCC)   AKI (acute kidney injury) (HCC)   Acute deep vein thrombosis (DVT) of proximal vein of left lower extremity (HCC)    1. Paroxysmal atrial fibrillation, in the setting of acute hypoxic respiratory failure, septic shock, COVID-19 pneumonia, presumed pulmonary embolus with left lower extremity DVT, converted to sinus rhythm, on Eliquis for stroke prevention, and transitioned to oral amiodarone for rate and rhythm control 2.Elevated troponin(1064, 1145, 1064, 1477),without significant delta, without evidence for myocardial ischemia, most consistent with myocardial injurythe setting of respiratory failure/septic  shock/possible pulmonary embolus (I5A ) 3.Septic shock, due to Covid pneumonia 4.DVT, possible PE, on Eliquis 5. Acute hypoxic respiratory failure, due to Covid pneumonia and presumed PE, on supplemental O2 nasal cannula, improving.  Recommendations: 1. Continue Eliquis for stroke prevention and DVT 2. Continue amiodarone 200 mg BID 3. Defer further cardiac diagnostics at this time 4. Follow-up with Dr. Gwen Pounds 1-2 weeks after discharge  Sign off for now; please call/haiku with questions.   Leanora Ivanoff, PA-C 03/08/2020 9:33 AM

## 2020-03-08 NOTE — Progress Notes (Signed)
PROGRESS NOTE    Paul Mitchell  PRX:[id number] DOB: 03/31/1950 DOA: 03/03/2020 PCP: Paul Kayser, MD    Assessment & Plan:   Active Problems:   Acute respiratory failure with hypoxia (HCC)   Atrial fibrillation with rapid ventricular response (HCC)   Elevated d-dimer   Sepsis ([place])   AKI (acute kidney injury) ([place])   Acute deep vein thrombosis (DVT) of proximal vein of left lower extremity (HCC)    Paul Mitchell is a 69 y.o. Male with a past medical history significant for hypertension and recent COVID-19 diagnosis on 02/22/2020 requiring hospitalization from 02/24/2020 to 02/29/2020 and discharged on 4 L of oxygen via nasal cannula,  who presented to [facility] ED on 03/03/2020 due to acute respiratory distress.    Upon EMS arrival he was found to be severely hypoxic with O2 saturations in the low 70s of which he was placed on nonrebreather mask with mild improvement.  Pt was in Afib w RVR, hypotensive BP 75/52.  He met sepsis criteria therefore he was given IV fluid resuscitation and broad-spectrum antibiotics with cefepime and vancomycin.  Despite IV fluid resuscitation, he remained hypotensive requiring Levophed infusion.  He was also placed on amiodarone infusion due to atrial fibrillation with RVR.  Pt was therefore admitted to ICU.  All medical issues resolving and pt was transferred to hospitalist service today 03/08/20.  Acute Hypoxic Respiratory Failure in the setting of COVID-19 Pneumonia, suspected superimposed Healthcare Associated Pneumonia, and presumed Pulmonary Embolus --only on 3L O2 now. --Continue supplemental O2 to keep sats >=90%, wean as tolerated  COVID-19 Pneumonia  --Previously completed course of Remdesivir and 5 day course of Baricitinib during his previous hospital admission from 02/24/20 to 02/29/20 Plan: --cont oral decadron while inpatient  Suspected superimposed bacterial PNA --completed 5 days of cefepime  Septic shock, POA --tachycardia, increased RR,  lactic acid 4.6, hypotension refractory to IVF resuscitation.  Source is PNA.  Hypotension, POA --Pt weaned off pressor 1 day prior to transfer out of ICU. --cont midodrine 5 mg TID (new)  AKI, POA, resolved. --Cr 2.19 on presentation.  Baseline around 1.1.  Cr now back to baseline.  Paroxysmal atrial fibrillation, w RVR, POA --in the setting of acute illness.  Already converted to sinus rhythm. --cardiology consulted Plan: --cont oral admiodarone 200 mg BID --cont Eliquis --Follow-up with Dr. Nehemiah Paul Mitchell 1-2 weeks after discharge  Elevated trop 2/2 demand ischemia --trop 1000's, flat --cardiology consulted, defer cardiac diagnostics at this time  LLE DVT --acute PE not confirmed --cont Eliquis  Prediabetes Hyperglycemia exacerbated by steroid use --SSI    DVT prophylaxis: [id number] Code Status: Full code  Family Communication:  Status is: inpatient Dispo:   The patient is from: home Anticipated d/c is to: home Anticipated d/c date is: 1-2 days Patient currently is not medically stable to d/c due to: Just transferred out of ICU, hasn't walked, need to ensure stability of BP and O2 requirement.     Subjective and Interval History:  Pt transferred from ICU to [place] today.  Pt reported coughing with sputum production.  Feeling better.  No pain.  Eating well.  Normal urination.  Pt has not walked since being in the ICU.   Objective: Vitals:   03/08/20 0500 03/08/20 0817 03/08/20 1128 03/08/20 1611  BP:  108/75 101/64 106/65  Pulse:  75 67 72  Resp:  '19 18 18  ' Temp:  98.6 F (37 C) 98.5 F (36.9 C) 98.6 F (37 C)  TempSrc:  SpO2:  97% 98% 96%  Weight: 95 kg     Height:        Intake/Output Summary (Last 24 hours) at 03/08/2020 1957 Last data filed at 03/08/2020 1631 Gross per 24 hour  Intake --  Output 1075 ml  Net -1075 ml   Filed Weights   03/05/20 0423 03/06/20 0447 03/08/20 0500  Weight: 94.4 kg 93.8 kg 95 kg    Examination:    Constitutional: NAD, AAOx3 HEENT: conjunctivae and lids normal, EOMI CV: RRR.  No cyanosis.   RESP: normal respiratory effort, on 3L Extremities: No effusions, edema in BLE SKIN: warm, dry and intact Neuro: II - XII grossly intact.   Psych: Normal mood and affect.  Appropriate judgement and reason   Data Reviewed: I have personally reviewed following labs and imaging studies  CBC: Recent Labs  Lab 03/04/20 0829 03/05/20 0400 03/06/20 0425 03/07/20 0348 03/08/20 0842  WBC 16.3* 12.3* 11.4* 9.7 10.3  NEUTROABS 10.9* 8.6* 7.9* 7.1 8.0*  HGB 13.5 12.0* 11.6* 11.1* 13.3  HCT 40.2 35.1* 34.1* 33.7* 39.2  MCV 91.0 90.0 91.2 90.8 89.7  PLT 230 199 191 152 102   Basic Metabolic Panel: Recent Labs  Lab 03/04/20 0411 03/05/20 0400 03/06/20 0425 03/07/20 0348 03/08/20 0842  NA 136 136 137 136 137  K 4.6 4.5 4.7 4.6 5.1  CL 107 105 105 105 102  CO2 19* '22 25 25 27  ' GLUCOSE 138* 124* 108* 109* 151*  BUN 34* 29* 26* 26* 28*  CREATININE 1.78* 1.40* 1.13 1.05 1.10  CALCIUM 7.9* 8.1* 8.3* 8.4* 9.0  MG 2.2 2.1 1.9 2.0 1.9  PHOS 3.6 3.1 3.5 4.2 4.0   GFR: Estimated Creatinine Clearance: 75.8 mL/min (by C-G formula based on SCr of 1.1 mg/dL). Liver Function Tests: Recent Labs  Lab 03/04/20 0411 03/05/20 0400 03/06/20 0425 03/07/20 0348 03/08/20 0842  AST 35 22 15 13* 19  ALT 48* 39 32 25 27  ALKPHOS 42 43 37* 39 44  BILITOT 1.1 1.0 1.1 0.8 0.9  PROT 6.0* 5.8* 5.6* 5.7* 6.4*  ALBUMIN 2.6* 2.5* 2.4* 2.4* 2.8*   No results for input(s): LIPASE, AMYLASE in the last 168 hours. No results for input(s): AMMONIA in the last 168 hours. Coagulation Profile: Recent Labs  Lab 03/03/20 1323  INR 1.2   Cardiac Enzymes: No results for input(s): CKTOTAL, CKMB, CKMBINDEX, TROPONINI in the last 168 hours. BNP (last 3 results) No results for input(s): PROBNP in the last 8760 hours. HbA1C: No results for input(s): HGBA1C in the last 72 hours. CBG: Recent Labs  Lab  03/07/20 1615 03/07/20 2052 03/08/20 0744 03/08/20 1123 03/08/20 1611  GLUCAP 98 143* 103* 110* 149*   Lipid Profile: No results for input(s): CHOL, HDL, LDLCALC, TRIG, CHOLHDL, LDLDIRECT in the last 72 hours. Thyroid Function Tests: No results for input(s): TSH, T4TOTAL, FREET4, T3FREE, THYROIDAB in the last 72 hours. Anemia Panel: Recent Labs    03/07/20 0348 03/08/20 0842  FERRITIN 528* 497*   Sepsis Labs: Recent Labs  Lab 03/03/20 1036 03/03/20 1250 03/03/20 1304 03/04/20 0411 03/05/20 0400  PROCALCITON  --   --  0.16 0.18 <0.10  LATICACIDVEN 4.6* 2.8*  --   --   --     Recent Results (from the past 240 hour(s))  Resp Panel by RT-PCR (Flu A&B, Covid) Nasopharyngeal Swab     Status: Abnormal   Collection Time: 03/03/20  8:12 AM   Specimen: Nasopharyngeal Swab; Nasopharyngeal(NP) swabs in vial transport  medium  Result Value Ref Range Status   SARS Coronavirus 2 by RT PCR POSITIVE (A) NEGATIVE Final    Comment: RESULT CALLED TO, READ BACK BY AND VERIFIED WITH: KENDALL [facility] RN AT 2778 ON 03/03/20 SNG (NOTE) SARS-CoV-2 target nucleic acids are DETECTED.  The SARS-CoV-2 RNA is generally detectable in upper respiratory specimens during the acute phase of infection. Positive results are indicative of the presence of the identified virus, but do not rule out bacterial infection or co-infection with other pathogens not detected by the test. Clinical correlation with patient history and other diagnostic information is necessary to determine patient infection status. The expected result is Negative.  Fact Sheet for Patients: EntrepreneurPulse.com.au  Fact Sheet for Healthcare Providers: IncredibleEmployment.be  This test is not yet approved or cleared by the [place] FDA and  has been authorized for detection and/or diagnosis of SARS-CoV-2 by FDA under an Emergency Use Authorization (EUA).  This EUA will remain in effect  (meaning this t est can be used) for the duration of  the COVID-19 declaration under Section 564(b)(1) of the Act, 21 U.S.C. section 360bbb-3(b)(1), unless the authorization is terminated or revoked sooner.     Influenza A by PCR NEGATIVE NEGATIVE Final   Influenza B by PCR NEGATIVE NEGATIVE Final    Comment: (NOTE) The Xpert Xpress SARS-CoV-2/FLU/RSV plus assay is intended as an aid in the diagnosis of influenza from Nasopharyngeal swab specimens and should not be used as a sole basis for treatment. Nasal washings and aspirates are unacceptable for Xpert Xpress SARS-CoV-2/FLU/RSV testing.  Fact Sheet for Patients: EntrepreneurPulse.com.au  Fact Sheet for Healthcare Providers: IncredibleEmployment.be  This test is not yet approved or cleared by the [place] FDA and has been authorized for detection and/or diagnosis of SARS-CoV-2 by FDA under an Emergency Use Authorization (EUA). This EUA will remain in effect (meaning this test can be used) for the duration of the COVID-19 declaration under Section 564(b)(1) of the Act, 21 U.S.C. section 360bbb-3(b)(1), unless the authorization is terminated or revoked.  Performed at [facility], [address] [place], [place] [address]   Blood culture (routine x 2)     Status: None   Collection Time: 03/03/20 11:38 AM   Specimen: BLOOD  Result Value Ref Range Status   Specimen Description BLOOD BLOOD LEFT FOREARM  Final   Special Requests   Final    BOTTLES DRAWN AEROBIC AND ANAEROBIC Blood Culture results may not be optimal due to an inadequate volume of blood received in culture bottles   Culture   Final    NO GROWTH 5 DAYS Performed at [facility], [address], [place], [place] [address]    Report Status 03/08/2020 FINAL  Final  Blood culture (routine x 2)     Status: None   Collection Time: 03/03/20 11:38 AM   Specimen: BLOOD  Result Value Ref Range Status    Specimen Description BLOOD REJ  Final   Special Requests   Final    BOTTLES DRAWN AEROBIC AND ANAEROBIC Blood Culture results may not be optimal due to an inadequate volume of blood received in culture bottles   Culture   Final    NO GROWTH 5 DAYS Performed at [facility], [address], [place], [place] [address]    Report Status 03/08/2020 FINAL  Final  MRSA PCR Screening     Status: None   Collection Time: 03/03/20  5:01 PM   Specimen: Nasopharyngeal  Result Value Ref Range  Status   MRSA by PCR NEGATIVE NEGATIVE Final    Comment:        The GeneXpert MRSA Assay (FDA approved for NASAL specimens only), is one component of a comprehensive MRSA colonization surveillance program. It is not intended to diagnose MRSA infection nor to guide or monitor treatment for MRSA infections. Performed at [facility], [address] [place], [place] [address]   Urine Culture     Status: None   Collection Time: 03/03/20  5:29 PM   Specimen: Urine, Random  Result Value Ref Range Status   Specimen Description   Final    URINE, RANDOM Performed at [facility], [address], [place], [place] [address]    Special Requests   Final    NONE Performed at [facility], [address], [place], [place] [address]    Culture   Final    NO GROWTH Performed at [facility], [place] [address] [place], [place] [address]    Report Status 03/05/2020 FINAL  Final  Expectorated sputum assessment w rflx to resp cult     Status: None   Collection Time: 03/04/20  8:54 AM   Specimen: Expectorated Sputum  Result Value Ref Range Status   Specimen Description EXPECTORATED SPUTUM  Final   Special Requests NONE  Final   Sputum evaluation   Final    Sputum specimen not acceptable for testing.  Please recollect.   RESULT CALLED TO, READ BACK BY AND VERIFIED WITH: R.CADY,RN AT 1402 ON 03/04/20 BY GM Performed at [facility], [address]  [place], [place] [address]    Report Status 03/04/2020 FINAL  Final  Expectorated sputum assessment w rflx to resp cult     Status: None   Collection Time: 03/05/20 10:15 AM   Specimen: SPU  Result Value Ref Range Status   Specimen Description SPUTUM  Final   Special Requests NONE  Final   Sputum evaluation   Final    Sputum specimen not acceptable for testing.  Please recollect.   C/RUSSELL CADY AT 1301 03/05/20.PMF Performed at [facility], [address] [place], [place] [address]    Report Status 03/05/2020 FINAL  Final  Expectorated sputum assessment w rflx to resp cult     Status: None   Collection Time: 03/05/20  5:49 PM   Specimen: SPU  Result Value Ref Range Status   Specimen Description SPUTUM  Final   Special Requests NONE  Final   Sputum evaluation   Final    THIS SPECIMEN IS ACCEPTABLE FOR SPUTUM CULTURE Performed at [facility], [address], [place], [place] [address]    Report Status 03/05/2020 FINAL  Final  Culture, respiratory     Status: None   Collection Time: 03/05/20  5:49 PM   Specimen: SPU  Result Value Ref Range Status   Specimen Description   Final    SPUTUM Performed at [facility], [address], [place],  [address]    Special Requests   Final    NONE Reflexed from [id number] Performed at [facility], [address] [place], [place] [address]    Gram Stain   Final    RARE WBC PRESENT,BOTH PMN AND MONONUCLEAR FEW BUDDING YEAST SEEN RARE GRAM POSITIVE COCCI IN CLUSTERS RARE GRAM NEGATIVE COCCOBACILLI    Culture   Final    MODERATE CANDIDA ALBICANS No Pseudomonas species isolated Performed at [facility], [place]  [address], [place], [place] [address]    Report Status 03/08/2020 FINAL  Final      Radiology Studies: DG Chest Port 1 View  Result Date: 03/07/2020 CLINICAL DATA:  COVID-19 positivity with shortness of breath EXAM: PORTABLE CHEST 1 VIEW COMPARISON:  03/03/2020 FINDINGS: Cardiac  shadow is stable. Patchy airspace opacities are again identified bilaterally consistent with the given clinical history. Left-sided jugular central line is again noted and stable. No bony abnormality is seen. IMPRESSION: Stable airspace opacities consistent with the given clinical history. Electronically Signed   By: Inez Catalina M.D.   On: 03/07/2020 09:20     Scheduled Meds: . amiodarone  200 mg Oral BID  . apixaban  10 mg Oral BID   Followed by  . [START ON 03/12/2020] apixaban  5 mg Oral BID  . vitamin C  500 mg Oral Daily  . chlorhexidine  15 mL Mouth Rinse BID  . Chlorhexidine Gluconate Cloth  6 each Topical Q0600  . dexamethasone  6 mg Oral Daily  . insulin aspart  0-15 Units Subcutaneous TID AC & HS  . mouth rinse  15 mL Mouth Rinse q12n4p  . midodrine  5 mg Oral TID WC  . zinc sulfate  220 mg Oral Daily   Continuous Infusions:   LOS: 5 days     Enzo Bi, MD [facility] If 7PM-7AM, please contact night-coverage 03/08/2020, 7:57 PM

## 2020-03-09 ENCOUNTER — Other Ambulatory Visit: Payer: Self-pay

## 2020-03-09 LAB — GLUCOSE, CAPILLARY
Glucose-Capillary: 100 mg/dL — ABNORMAL HIGH (ref 70–99)
Glucose-Capillary: 126 mg/dL — ABNORMAL HIGH (ref 70–99)
Glucose-Capillary: 122 mg/dL — ABNORMAL HIGH (ref 70–99)
Glucose-Capillary: 177 mg/dL — ABNORMAL HIGH (ref 70–99)
Glucose-Capillary: 141 mg/dL — ABNORMAL HIGH (ref 70–99)

## 2020-03-09 LAB — BASIC METABOLIC PANEL
Anion gap: 7 (ref 5–15)
BUN: 30 mg/dL — ABNORMAL HIGH (ref 8–23)
CO2: 28 mmol/L (ref 22–32)
Calcium: 8.8 mg/dL — ABNORMAL LOW (ref 8.9–10.3)
Chloride: 103 mmol/L (ref 98–111)
Creatinine, Ser: 1.1 mg/dL (ref 0.61–1.24)
GFR, Estimated: >60 mL/min (ref >60)
Glucose, Bld: 113 mg/dL — ABNORMAL HIGH (ref 70–99)
Potassium: 5.6 mmol/L — ABNORMAL HIGH (ref 3.5–5.1)
Sodium: 138 mmol/L (ref 135–145)

## 2020-03-09 LAB — CBC
HCT: 37.5 % — ABNORMAL LOW (ref 39.0–52.0)
Hemoglobin: 12.8 g/dL — ABNORMAL LOW (ref 13.0–17.0)
MCH: 30.9 pg (ref 26.0–34.0)
MCHC: 34.1 g/dL (ref 30.0–36.0)
MCV: 90.6 fL (ref 80.0–100.0)
Platelets: 160 10*3/uL (ref 150–400)
RBC: 4.14 MIL/uL — ABNORMAL LOW (ref 4.22–5.81)
RDW: 13.6 % (ref 11.5–15.5)
WBC: 11.3 10*3/uL — ABNORMAL HIGH (ref 4.0–10.5)
nRBC: 0 % (ref 0.0–0.2)

## 2020-03-09 LAB — PHOSPHORUS: Phosphorus: 3.9 mg/dL (ref 2.5–4.6)

## 2020-03-09 LAB — POTASSIUM: Potassium: 4.6 mmol/L (ref 3.5–5.1)

## 2020-03-09 LAB — MAGNESIUM: Magnesium: 2.1 mg/dL (ref 1.7–2.4)

## 2020-03-09 LAB — LACTATE DEHYDROGENASE: LDH: 200 U/L — ABNORMAL HIGH (ref 98–192)

## 2020-03-09 NOTE — TOC Progression Note (Signed)
Transition of Care [facility]) - Progression Note    Patient Details  Name: Paul Mitchell MRN: [id number] Date of Birth: [birth year]  Transition of Care [facility]) CM/SW Contact  Allayne Butcher, RN Phone Number: 03/09/2020, 1:57 PM  Clinical Narrative:    Potential discharge tomorrow.  PT to see patient today.  Patient will be discharged on Eliquis.  RNCM will provide patient with Eliquis 30 day free coupon card before discharge.    Expected Discharge Plan: Home/Self Care Barriers to Discharge: Continued Medical Work up  Expected Discharge Plan and Services Expected Discharge Plan: Home/Self Care       Living arrangements for the past 2 months: Single Family Home                                       Social Determinants of Health (SDOH) Interventions    Readmission Risk Interventions No flowsheet data found.

## 2020-03-09 NOTE — Progress Notes (Signed)
PROGRESS NOTE    Paul Mitchell  MVE:[id number] DOB: 1950-10-19 DOA: 03/03/2020 PCP: Ezequiel Kayser, MD   Chief complaint: shortness of breath. Brief Narrative:   Paul Mitchell is a 69 y.o. Male with a past medicalhistory significant for hypertension and recent COVID-19 diagnosis on 02/22/2020 requiring hospitalization from 02/24/2020 to 02/29/2020 and discharged on 4 L of oxygen via nasal cannula, who presented to [facility] ED on 03/03/2020 due to acute respiratory distress.  Upon EMS arrival he was found to be severely hypoxic with O2 saturations in the low 70s of which he was placed on nonrebreather mask with mild improvement.  Pt was in Afib w RVR, hypotensive BP 75/52.  He met sepsis criteria therefore he was given IV fluid resuscitation and broad-spectrum antibiotics with cefepime and vancomycin. Despite IV fluid resuscitation, he remained hypotensive requiring Levophed infusion.He was also placed on amiodarone infusion due to atrial fibrillation with RVR.  Pt was therefore admitted to ICU.  All medical issues resolving and pt was transferred to hospitalist service today 03/08/20.   Assessment & Plan:   Active Problems:   Acute respiratory failure with hypoxia (HCC)   Atrial fibrillation with rapid ventricular response (HCC)   Elevated d-dimer   Sepsis ([place])   AKI (acute kidney injury) ([place])   Acute deep vein thrombosis (DVT) of proximal vein of left lower extremity ([place])  #1.  Acute hypoxemic respiratory failure. COVID-19 pneumonia Healthcare associated pneumonia. Presumed pulmonary emboli. Patient condition ED improving.  Patient still on 3 L oxygen.  Patient was on 2-3 L of oxygen at the last discharge. Continue oral Decadron. Antibiotics completed.  #2.  Septic shock. Secondary to healthcare associated pneumonia. Condition improved.  3.  Acute kidney injury. Condition resolved.  4.  Paroxysmal atrial fibrillation with rapid ventricular response. Left lower  extremity DVT. Continue Eliquis.     DVT prophylaxis: Eliquis Code Status: Full Family Communication: wife updated Disposition Plan:  .   Status is: Inpatient  Remains inpatient appropriate because:Inpatient level of care appropriate due to severity of illness   Dispo: The patient is from: Home              Anticipated d/c is to: Home              Anticipated d/c date is: 1 day              Patient currently is not medically stable to d/c.        I/O last 3 completed shifts: In: -  Out: 1925 [Urine:1925] No intake/output data recorded.     Consultants:   none  Procedures: none  Antimicrobials: None  Subjective: Much better today.  Still on 3 L oxygen, short of breath with exertion.  No cough. No fever chills. No abdominal pain or nausea vomiting. No dysuria hematuria  Objective: Vitals:   03/09/20 0804 03/09/20 0809 03/09/20 0812 03/09/20 1206  BP: 107/72 (!) 88/68 110/77 110/75  Pulse: 66 93 93 72  Resp: '18 18 16 18  ' Temp: (!) 97.5 F (36.4 C) 98.5 F (36.9 C) 98.5 F (36.9 C) 98 F (36.7 C)  TempSrc: Oral     SpO2: 99% 94% 95% 100%  Weight:      Height:        Intake/Output Summary (Last 24 hours) at 03/09/2020 1228 Last data filed at 03/09/2020 0437 Gross per 24 hour  Intake --  Output 900 ml  Net -900 ml   Filed Weights   03/05/20 0423 03/06/20  0174 03/08/20 0500  Weight: 94.4 kg 93.8 kg 95 kg    Examination:  General exam: Appears calm and comfortable  Respiratory system: Clear to auscultation. Respiratory effort normal. Cardiovascular system: S1 & S2 heard, RRR. No JVD, murmurs, rubs, gallops or clicks. No pedal edema. Gastrointestinal system: Abdomen is nondistended, soft and nontender. No organomegaly or masses felt. Normal bowel sounds heard. Central nervous system: Alert and oriented. No focal neurological deficits. Extremities: Symmetric. Skin: No rashes, lesions or ulcers Psychiatry:  Mood & affect appropriate.      Data Reviewed: I have personally reviewed following labs and imaging studies  CBC: Recent Labs  Lab 03/04/20 0829 03/05/20 0400 03/06/20 0425 03/07/20 0348 03/08/20 0842 03/09/20 0516  WBC 16.3* 12.3* 11.4* 9.7 10.3 11.3*  NEUTROABS 10.9* 8.6* 7.9* 7.1 8.0*  --   HGB 13.5 12.0* 11.6* 11.1* 13.3 12.8*  HCT 40.2 35.1* 34.1* 33.7* 39.2 37.5*  MCV 91.0 90.0 91.2 90.8 89.7 90.6  PLT 230 199 191 152 153 944   Basic Metabolic Panel: Recent Labs  Lab 03/05/20 0400 03/06/20 0425 03/07/20 0348 03/08/20 0842 03/09/20 0516 03/09/20 1006  NA 136 137 136 137 138  --   K 4.5 4.7 4.6 5.1 5.6* 4.6  CL 105 105 105 102 103  --   CO2 '22 25 25 27 28  ' --   GLUCOSE 124* 108* 109* 151* 113*  --   BUN 29* 26* 26* 28* 30*  --   CREATININE 1.40* 1.13 1.05 1.10 1.10  --   CALCIUM 8.1* 8.3* 8.4* 9.0 8.8*  --   MG 2.1 1.9 2.0 1.9 2.1  --   PHOS 3.1 3.5 4.2 4.0 3.9  --    GFR: Estimated Creatinine Clearance: 75.8 mL/min (by C-G formula based on SCr of 1.1 mg/dL). Liver Function Tests: Recent Labs  Lab 03/04/20 0411 03/05/20 0400 03/06/20 0425 03/07/20 0348 03/08/20 0842  AST 35 22 15 13* 19  ALT 48* 39 32 25 27  ALKPHOS 42 43 37* 39 44  BILITOT 1.1 1.0 1.1 0.8 0.9  PROT 6.0* 5.8* 5.6* 5.7* 6.4*  ALBUMIN 2.6* 2.5* 2.4* 2.4* 2.8*   No results for input(s): LIPASE, AMYLASE in the last 168 hours. No results for input(s): AMMONIA in the last 168 hours. Coagulation Profile: Recent Labs  Lab 03/03/20 1323  INR 1.2   Cardiac Enzymes: No results for input(s): CKTOTAL, CKMB, CKMBINDEX, TROPONINI in the last 168 hours. BNP (last 3 results) No results for input(s): PROBNP in the last 8760 hours. HbA1C: No results for input(s): HGBA1C in the last 72 hours. CBG: Recent Labs  Lab 03/08/20 1123 03/08/20 1611 03/08/20 2013 03/09/20 0756 03/09/20 1150  GLUCAP 110* 149* 150* 100* 126*   Lipid Profile: No results for input(s): CHOL, HDL, LDLCALC, TRIG, CHOLHDL, LDLDIRECT in the  last 72 hours. Thyroid Function Tests: No results for input(s): TSH, T4TOTAL, FREET4, T3FREE, THYROIDAB in the last 72 hours. Anemia Panel: Recent Labs    03/07/20 0348 03/08/20 0842  FERRITIN 528* 497*   Sepsis Labs: Recent Labs  Lab 03/03/20 1036 03/03/20 1250 03/03/20 1304 03/04/20 0411 03/05/20 0400  PROCALCITON  --   --  0.16 0.18 <0.10  LATICACIDVEN 4.6* 2.8*  --   --   --     Recent Results (from the past 240 hour(s))  Resp Panel by RT-PCR (Flu A&B, Covid) Nasopharyngeal Swab     Status: Abnormal   Collection Time: 03/03/20  8:12 AM   Specimen: Nasopharyngeal Swab; Nasopharyngeal(NP)  swabs in vial transport medium  Result Value Ref Range Status   SARS Coronavirus 2 by RT PCR POSITIVE (A) NEGATIVE Final    Comment: RESULT CALLED TO, READ BACK BY AND VERIFIED WITH: KENDALL [facility] RN AT 1884 ON 03/03/20 SNG (NOTE) SARS-CoV-2 target nucleic acids are DETECTED.  The SARS-CoV-2 RNA is generally detectable in upper respiratory specimens during the acute phase of infection. Positive results are indicative of the presence of the identified virus, but do not rule out bacterial infection or co-infection with other pathogens not detected by the test. Clinical correlation with patient history and other diagnostic information is necessary to determine patient infection status. The expected result is Negative.  Fact Sheet for Patients: EntrepreneurPulse.com.au  Fact Sheet for Healthcare Providers: IncredibleEmployment.be  This test is not yet approved or cleared by the [place] FDA and  has been authorized for detection and/or diagnosis of SARS-CoV-2 by FDA under an Emergency Use Authorization (EUA).  This EUA will remain in effect (meaning this t est can be used) for the duration of  the COVID-19 declaration under Section 564(b)(1) of the Act, 21 U.S.C. section 360bbb-3(b)(1), unless the authorization is terminated or revoked  sooner.     Influenza A by PCR NEGATIVE NEGATIVE Final   Influenza B by PCR NEGATIVE NEGATIVE Final    Comment: (NOTE) The Xpert Xpress SARS-CoV-2/FLU/RSV plus assay is intended as an aid in the diagnosis of influenza from Nasopharyngeal swab specimens and should not be used as a sole basis for treatment. Nasal washings and aspirates are unacceptable for Xpert Xpress SARS-CoV-2/FLU/RSV testing.  Fact Sheet for Patients: EntrepreneurPulse.com.au  Fact Sheet for Healthcare Providers: IncredibleEmployment.be  This test is not yet approved or cleared by the [place] FDA and has been authorized for detection and/or diagnosis of SARS-CoV-2 by FDA under an Emergency Use Authorization (EUA). This EUA will remain in effect (meaning this test can be used) for the duration of the COVID-19 declaration under Section 564(b)(1) of the Act, 21 U.S.C. section 360bbb-3(b)(1), unless the authorization is terminated or revoked.  Performed at [facility], [address] [place], [place] [address]   Blood culture (routine x 2)     Status: None   Collection Time: 03/03/20 11:38 AM   Specimen: BLOOD  Result Value Ref Range Status   Specimen Description BLOOD BLOOD LEFT FOREARM  Final   Special Requests   Final    BOTTLES DRAWN AEROBIC AND ANAEROBIC Blood Culture results may not be optimal due to an inadequate volume of blood received in culture bottles   Culture   Final    NO GROWTH 5 DAYS Performed at [facility], [address], [place], [place] [address]    Report Status 03/08/2020 FINAL  Final  Blood culture (routine x 2)     Status: None   Collection Time: 03/03/20 11:38 AM   Specimen: BLOOD  Result Value Ref Range Status   Specimen Description BLOOD REJ  Final   Special Requests   Final    BOTTLES DRAWN AEROBIC AND ANAEROBIC Blood Culture results may not be optimal due to an inadequate volume of blood received in culture  bottles   Culture   Final    NO GROWTH 5 DAYS Performed at [facility], [address] [place], [place] [address]    Report Status 03/08/2020 FINAL  Final  MRSA PCR Screening     Status: None   Collection Time: 03/03/20  5:01 PM   Specimen: Nasopharyngeal  Result Value Ref Range Status   MRSA by PCR NEGATIVE NEGATIVE Final    Comment:        The GeneXpert MRSA Assay (FDA approved for NASAL specimens only), is one component of a comprehensive MRSA colonization surveillance program. It is not intended to diagnose MRSA infection nor to guide or monitor treatment for MRSA infections. Performed at [facility], [address] [place], [place] [address]   Urine Culture     Status: None   Collection Time: 03/03/20  5:29 PM   Specimen: Urine, Random  Result Value Ref Range Status   Specimen Description   Final    URINE, RANDOM Performed at [facility], [address] [place], [place] [address]    Special Requests   Final    NONE Performed at [facility], [address], [place], [place] [address]    Culture   Final    NO GROWTH Performed at [facility], [place] [address] [place], [place] [address]    Report Status 03/05/2020 FINAL  Final  Expectorated sputum assessment w rflx to resp cult     Status: None   Collection Time: 03/04/20  8:54 AM   Specimen: Expectorated Sputum  Result Value Ref Range Status   Specimen Description EXPECTORATED SPUTUM  Final   Special Requests NONE  Final   Sputum evaluation   Final    Sputum specimen not acceptable for testing.  Please recollect.   RESULT CALLED TO, READ BACK BY AND VERIFIED WITH: R.CADY,RN AT 1402 ON 03/04/20 BY GM Performed at [facility], [address] [place], [place] [address]    Report Status 03/04/2020 FINAL  Final  Expectorated sputum assessment w rflx to resp cult     Status: None   Collection Time: 03/05/20 10:15 AM   Specimen: SPU  Result Value Ref  Range Status   Specimen Description SPUTUM  Final   Special Requests NONE  Final   Sputum evaluation   Final    Sputum specimen not acceptable for testing.  Please recollect.   C/RUSSELL CADY AT 1301 03/05/20.PMF Performed at [facility], [address] [place], [place] [address]    Report Status 03/05/2020 FINAL  Final  Expectorated sputum assessment w rflx to resp cult     Status: None   Collection Time: 03/05/20  5:49 PM   Specimen: SPU  Result Value Ref Range Status   Specimen Description SPUTUM  Final   Special Requests NONE  Final   Sputum evaluation   Final    THIS SPECIMEN IS ACCEPTABLE FOR SPUTUM CULTURE Performed at [facility], [address], [place], [place] [address]    Report Status 03/05/2020 FINAL  Final  Culture, respiratory     Status: None   Collection Time: 03/05/20  5:49 PM   Specimen: SPU  Result Value Ref Range Status   Specimen Description   Final    SPUTUM Performed at [facility], [address], [place], [place] [address]    Special Requests   Final    NONE Reflexed from [id number] Performed at [facility], [address] [place], [place] [address]    Gram Stain   Final    RARE WBC PRESENT,BOTH PMN AND MONONUCLEAR FEW BUDDING YEAST SEEN RARE GRAM POSITIVE COCCI IN CLUSTERS RARE GRAM NEGATIVE COCCOBACILLI    Culture   Final    MODERATE CANDIDA ALBICANS No Pseudomonas species isolated Performed at [facility], [address] [place], [place] [address]    Report Status 03/08/2020 FINAL  Final         Radiology Studies: No results found.      Scheduled Meds: . amiodarone  200 mg Oral BID  . apixaban  10 mg Oral BID   Followed by  . [START ON 03/12/2020] apixaban  5 mg Oral BID  . vitamin C  500 mg Oral Daily  . chlorhexidine  15 mL Mouth Rinse BID  . Chlorhexidine Gluconate Cloth  6 each Topical Q0600  . dexamethasone  6 mg Oral Daily  . insulin aspart  0-15 Units Subcutaneous  TID AC & HS  . mouth rinse  15 mL Mouth Rinse q12n4p  . midodrine  5 mg Oral TID WC  . zinc sulfate  220 mg Oral Daily   Continuous Infusions:   LOS: 6 days    Time spent: 27 minutes    Sharen Hones, MD [facility]   To contact the attending provider between 7A-7P or the covering provider during after hours 7P-7A, please log into the web site [email or web address] and access using universal [place] password for that web site. If you do not have the password, please call the hospital operator.  03/09/2020, 12:28 PM

## 2020-03-09 NOTE — Evaluation (Signed)
Physical Therapy Evaluation Patient Details Name: Paul Mitchell MRN: [id number] DOB: 05-23-1950 Today's Date: 03/09/2020   History of Present Illness  69 y.o. male with medical history significant for hypertension, who was here at the end of November with shortness of breath and low energy. Tested positive for COVID19, admitted and went home on 4L on 12/6.  He returned 12/9 with respiratory distress.  Clinical Impression  Pt reports that he was very good about keeping O2 on at home after recent d/c and that he did do plenty of activity in the home but never for a sustained amount of time.  However he ultimately was unable to maintain his O2 saturations and needed to come in with respiratory distress.  Pt very eager to walk and see how his sats sustained, on 4L he was able to maintain high 80s most of the first 150 ft bout (ultimatley dropping to 87%), after a few minutes rest break and O2 back to 94% we did another ~64ft but O2 dropped quicker and down to 84%.  No AD, no LOBs, no safety issues apart from breathing/O2 status.  Follow Up Recommendations Home health PT (per progress, may not need HHPT once ready for d/c)    Equipment Recommendations  None recommended by PT    Recommendations for Other Services       Precautions / Restrictions Precautions Precautions: Fall Restrictions Weight Bearing Restrictions: No      Mobility  Bed Mobility Overal bed mobility: Independent             General bed mobility comments: easily gets in/out of bed    Transfers Overall transfer level: Independent Equipment used: None Transfers: Sit to/from Stand Sit to Stand: Independent         General transfer comment: Able to rise with good confidence and safety, no assist required  Ambulation/Gait Ambulation/Gait assistance: Supervision Gait Distance (Feet): 150 Feet Assistive device: None       General Gait Details: Pt with slow and deliberate gait but with no LOBs or overt  safety issues. 4L t/o the effort with sats slowly dropping from low 90s into mid 80s.  First bout was 150 ft with sats dropping to 87%.  After a few minutes rest break we did another bout of in-room ambulation, this time we managed ~90 ft but sats dropped more quickly down to 84%.  Each time HR would initially rise to the 110s, but then maintain w/o c/o excessive fatigue.  Stairs            Wheelchair Mobility    Modified Rankin (Stroke Patients Only)       Balance Overall balance assessment: Needs assistance Sitting-balance support: No upper extremity supported;Feet supported Sitting balance-Leahy Scale: Normal       Standing balance-Leahy Scale: Good Standing balance comment: guarded with ambulation, but good confidence and safety with static standing balance                             Pertinent Vitals/Pain Pain Assessment: No/denies pain    Home Living Family/patient expects to be discharged to:: Private residence Living Arrangements: Spouse/significant other Available Help at Discharge: Family;Available 24 hours/day Type of Home: House Home Access: Stairs to enter Entrance Stairs-Rails: Right Entrance Stairs-Number of Steps: 4 Home Layout: One level Home Equipment: None      Prior Function Level of Independence: Independent         Comments: Independent in all  aspects of care. Works full time driving a truck.     Hand Dominance        Extremity/Trunk Assessment   Upper Extremity Assessment Upper Extremity Assessment: Overall WFL for tasks assessed    Lower Extremity Assessment Lower Extremity Assessment: Overall WFL for tasks assessed       Communication   Communication: No difficulties  Cognition Arousal/Alertness: Awake/alert Behavior During Therapy: WFL for tasks assessed/performed Overall Cognitive Status: Within Functional Limits for tasks assessed                                        General Comments       Exercises     Assessment/Plan    PT Assessment Patient needs continued PT services  PT Problem List Decreased strength;Decreased activity tolerance;Decreased balance;Decreased mobility;Cardiopulmonary status limiting activity       PT Treatment Interventions Therapeutic exercise;Balance training;Functional mobility training;Stair training;Gait training;Therapeutic activities;Patient/family education;Neuromuscular re-education    PT Goals (Current goals can be found in the Care Plan section)  Acute Rehab PT Goals Patient Stated Goal: to go home PT Goal Formulation: With patient Time For Goal Achievement: 03/23/20 Potential to Achieve Goals: Good    Frequency Min 2X/week   Barriers to discharge        Co-evaluation               AM-PAC PT "6 Clicks" Mobility  Outcome Measure Help needed turning from your back to your side while in a flat bed without using bedrails?: None Help needed moving from lying on your back to sitting on the side of a flat bed without using bedrails?: None Help needed moving to and from a bed to a chair (including a wheelchair)?: None Help needed standing up from a chair using your arms (e.g., wheelchair or bedside chair)?: None Help needed to walk in hospital room?: A Little Help needed climbing 3-5 steps with a railing? : A Little 6 Click Score: 22    End of Session Equipment Utilized During Treatment: Oxygen (3.5L on arrival, 4L with activity) Activity Tolerance: Patient tolerated treatment well;Patient limited by fatigue Patient left: in bed;with call bell/phone within reach Nurse Communication: Mobility status (O2/HR with ambulation) PT Visit Diagnosis: Difficulty in walking, not elsewhere classified (R26.2);Unsteadiness on feet (R26.81)    Time: 9937-1696 PT Time Calculation (min) (ACUTE ONLY): 32 min   Charges:   PT Evaluation $PT Eval Low Complexity: 1 Low PT Treatments $Gait Training: 8-22 mins        Malachi Pro,  DPT 03/09/2020, 3:47 PM

## 2020-03-10 DIAGNOSIS — R6521 Severe sepsis with septic shock: Secondary | ICD-10-CM

## 2020-03-10 DIAGNOSIS — N170 Acute kidney failure with tubular necrosis: Secondary | ICD-10-CM

## 2020-03-10 LAB — GLUCOSE, CAPILLARY
Glucose-Capillary: 71 mg/dL (ref 70–99)
Glucose-Capillary: 127 mg/dL — ABNORMAL HIGH (ref 70–99)

## 2020-03-10 LAB — BASIC METABOLIC PANEL
Anion gap: 7 (ref 5–15)
BUN: 32 mg/dL — ABNORMAL HIGH (ref 8–23)
CO2: 28 mmol/L (ref 22–32)
Calcium: 8.9 mg/dL (ref 8.9–10.3)
Chloride: 102 mmol/L (ref 98–111)
Creatinine, Ser: 1.19 mg/dL (ref 0.61–1.24)
GFR, Estimated: >60 mL/min (ref >60)
Glucose, Bld: 94 mg/dL (ref 70–99)
Potassium: 5.2 mmol/L — ABNORMAL HIGH (ref 3.5–5.1)
Sodium: 137 mmol/L (ref 135–145)

## 2020-03-10 LAB — CBC
HCT: 37.7 % — ABNORMAL LOW (ref 39.0–52.0)
Hemoglobin: 12.7 g/dL — ABNORMAL LOW (ref 13.0–17.0)
MCH: 30.8 pg (ref 26.0–34.0)
MCHC: 33.7 g/dL (ref 30.0–36.0)
MCV: 91.3 fL (ref 80.0–100.0)
Platelets: 149 10*3/uL — ABNORMAL LOW (ref 150–400)
RBC: 4.13 MIL/uL — ABNORMAL LOW (ref 4.22–5.81)
RDW: 13.6 % (ref 11.5–15.5)
WBC: 11.3 10*3/uL — ABNORMAL HIGH (ref 4.0–10.5)
nRBC: 0 % (ref 0.0–0.2)

## 2020-03-10 LAB — PHOSPHORUS: Phosphorus: 3.8 mg/dL (ref 2.5–4.6)

## 2020-03-10 LAB — MAGNESIUM: Magnesium: 2.1 mg/dL (ref 1.7–2.4)

## 2020-03-10 MED ORDER — MIDODRINE HCL 5 MG PO TABS: 5.0000 mg | ORAL_TABLET | Freq: Three times a day (TID) | ORAL | 0 refills | Status: AC

## 2020-03-10 MED ORDER — SODIUM ZIRCONIUM CYCLOSILICATE 10 G PO PACK
10.0000 g | PACK | Freq: Once | ORAL | Status: AC
  Administered 2020-03-10: 12:00:00 10 g via ORAL
  Filled 2020-03-10: qty 1

## 2020-03-10 MED ORDER — AMIODARONE HCL 200 MG PO TABS: 200.0000 mg | ORAL_TABLET | Freq: Two times a day (BID) | ORAL | 0 refills | Status: DC

## 2020-03-10 MED ORDER — APIXABAN 5 MG PO TABS: 10.0000 mg | ORAL_TABLET | Freq: Two times a day (BID) | ORAL | 0 refills | Status: DC

## 2020-03-10 MED ORDER — APIXABAN 5 MG PO TABS: 5.0000 mg | ORAL_TABLET | Freq: Two times a day (BID) | ORAL | 0 refills | Status: DC

## 2020-03-10 NOTE — Discharge Summary (Addendum)
Physician Discharge Summary  Patient ID: SCOT SHIRAISHI MRN: [id number] DOB/AGE: [birth year] 69 y.o.  Admit date: 03/03/2020 Discharge date: 03/10/2020  Admission Diagnoses:  Discharge Diagnoses:  Active Problems:   Acute respiratory failure with hypoxia (HCC)   Atrial fibrillation with rapid ventricular response (HCC)   Elevated d-dimer   Sepsis ([place])   AKI (acute kidney injury) ([place])   Acute deep vein thrombosis (DVT) of proximal vein of left lower extremity [facility])   Discharged Condition: good  Hospital Course:  Furqan Gosselin is a 69 y.o. Male with a past medicalhistory significant for hypertension and recent COVID-19 diagnosis on 02/22/2020 requiring hospitalization from 02/24/2020 to 02/29/2020 and discharged on 4 L of oxygen via nasal cannula, who presentedto [facility] ED on 03/03/2020 due to acute respiratory distress.  Upon EMS arrival he was found to be severely hypoxic with O2 saturations in the low 70s of which he was placed on nonrebreather mask with mild improvement.Pt was in Afib w RVR, hypotensiveBP 75/52. He met sepsis criteria therefore he was given IV fluid resuscitation and broad-spectrum antibiotics with cefepime and vancomycin. Despite IV fluid resuscitation, he remained hypotensive requiring Levophed infusion.He was also placed on amiodarone infusion due to atrial fibrillation with RVR.Pt was therefore admitted to ICU.  All medical issues resolving and pt was transferred to hospitalist service today 03/08/20.  #1.  Acute hypoxemic respiratory failure. COVID-19 pneumonia Healthcare associated pneumonia. Presumed pulmonary emboli. Patient condition ED improving.  Patient still on 3 L oxygen.  Patient was on 2-3 L of oxygen at the last discharge. Steroids completed Antibiotics completed.  #2.  Septic shock. Orthostatic hypotension. Essential hypertension. Secondary to healthcare associated pneumonia. Condition improved.  Patient was also started on  midodrine for orthostatic hypotension, will continue for that. Hold off some of the blood pressure medicines.  3.  Acute kidney injury. Condition resolved.  4.  Paroxysmal atrial fibrillation with rapid ventricular response. Left lower extremity DVT. Continue Eliquis. Patient will be followed by cardiology in the near future.  Continue amiodarone at current dose for 2 weeks, dose adjustment when he is seen by cardiology.  5.  Hyperkalemia. Patient has normal renal function now.  Potassium was high yesterday morning, repeat potassium without treatment was normal.  Potassium was slightly higher this morning again, I cannot entirely rule out the possibility of true hyperkalemia, will give a dose of Lokelma before discharge     Consults: cardiology and pulmonary/intensive care  Significant Diagnostic Studies:  BILATERAL LOWER EXTREMITY VENOUS DOPPLER ULTRASOUND  TECHNIQUE: Gray-scale sonography with compression, as well as color and duplex ultrasound, were performed to evaluate the deep venous system(s) from the level of the common femoral vein through the popliteal and proximal calf veins.  COMPARISON:  None  FINDINGS: VENOUS  RIGHT lower extremity: Deep venous system is patent and compressible from the RIGHT common femoral vein through the RIGHT popliteal vein into visualized calf veins. Spontaneous venous flow is present with intact augmentation. No intraluminal thrombus identified.  LEFT lower extremity: LEFT common femoral, profundus femoral, and femoral veins are patent and compressible. Spontaneous flow is present respiratory phasicity. No intraluminal thrombus identified. However, at the LEFT popliteal vein and extending into proximal LEFT calf veins nonocclusive intraluminal thrombus is identified consistent with deep venous thrombosis. Diminished spontaneous venous flow is seen and these veins are only partially  compressible.  OTHER  None  Limitations: none  IMPRESSION: Positive exam for deep venous thrombosis in the LEFT lower extremity involving the LEFT popliteal vein and proximal  LEFT calf veins as above.  No evidence of deep venous thrombosis in the RIGHT lower extremity.   Electronically Signed   By: Lavonia Dana M.D.   On: 03/03/2020 18:02    Treatments: Antibiotics, steroids.  Anticoagulation, IV fluids   Discharge Exam: Blood pressure 99/64, pulse 75, temperature 98.2 F (36.8 C), temperature source Axillary, resp. rate 18, height '6\' 2"'  (1.88 m), weight 95 kg, SpO2 99 %. General appearance: alert and cooperative Resp: clear to auscultation bilaterally Cardio: Irregular, no murmurs. GI: soft, non-tender; bowel sounds normal; no masses,  no organomegaly Extremities: extremities normal, atraumatic, no cyanosis or edema  Disposition: Discharge disposition: 01-Home or Self Care       Discharge Instructions    Amb referral to AFIB Clinic   Complete by: As directed    Diet - low sodium heart healthy   Complete by: As directed    Increase activity slowly   Complete by: As directed      Allergies as of 03/10/2020      Reactions   Enalapril Cough      Medication List    STOP taking these medications   dexamethasone 6 MG tablet Commonly known as: Decadron   furosemide 20 MG tablet Commonly known as: LASIX   hydrochlorothiazide 12.5 MG capsule Commonly known as: MICROZIDE   losartan 100 MG tablet Commonly known as: COZAAR     TAKE these medications   albuterol 108 (90 Base) MCG/ACT inhaler Commonly known as: VENTOLIN HFA Inhale 2 puffs into the lungs every 6 (six) hours as needed for wheezing or shortness of breath.   amiodarone 200 MG tablet Commonly known as: PACERONE Take 1 tablet (200 mg total) by mouth 2 (two) times daily.   apixaban 5 MG Tabs tablet Commonly known as: ELIQUIS Take 2 tablets (10 mg total) by mouth 2 (two) times daily  for 2 days.   apixaban 5 MG Tabs tablet Commonly known as: ELIQUIS Take 1 tablet (5 mg total) by mouth 2 (two) times daily. Start taking on: March 12, 2020   aspirin EC 81 MG tablet Take 81 mg by mouth daily.   benzonatate 100 MG capsule Commonly known as: Tessalon Perles Take 1 capsule (100 mg total) by mouth 3 (three) times daily as needed for cough.   guaiFENesin-dextromethorphan 100-10 MG/5ML syrup Commonly known as: ROBITUSSIN DM Take 10 mLs by mouth every 4 (four) hours as needed for cough.   midodrine 5 MG tablet Commonly known as: PROAMATINE Take 1 tablet (5 mg total) by mouth 3 (three) times daily with meals for 14 days.       Follow-up Information    Schnier, Dolores Lory, MD Follow up in 1 week(s).   Specialties: Vascular Surgery, Cardiology, Radiology, Vascular Surgery Why: Can see Schnier or Arna Medici. Will need LEFT DVT with visit.  Contact information: [place] [address] [phone]        Ezequiel Kayser, MD Follow up in 1 week(s).   Specialty: Internal Medicine Contact information: [place] [address] [phone]        Isaias Cowman, MD Follow up in 2 week(s).   Specialty: Cardiology Contact information: [facility] [place] [address] [phone]              35 minutes Signed: Sharen Hones 03/10/2020, 10:15 AM

## 2020-03-10 NOTE — TOC Transition Note (Signed)
Transition of Care [facility]) - CM/SW Discharge Note   Patient Details  Name: Paul Mitchell MRN: [id number] Date of Birth: 23-Jun-1950  [facility]) CM/SW Contact:  Shelbie Hutching, RN Phone Number: 03/10/2020, 10:24 AM   Clinical Narrative:    Patient is medically cleared for discharge home.  RNCM met with patient at the bedside this morning.  Eliquis 30 day free coupon card given to patient, he will pick up his prescriptions at [facility] on [address].  Patient declines home health services.  Patient has oxygen at home from [employer].  Daughter will pick patient up today and RNCM instructed patient to have her bring an oxygen tank with her.    Final next level of care: Home/Self Care Barriers to Discharge: Barriers Resolved   Patient Goals and CMS Choice        Discharge Placement                       Discharge Plan and Services                          HH Arranged: Refused [place] Agency: NA        Social Determinants of Health (SDOH) Interventions     Readmission Risk Interventions No flowsheet data found.

## 2020-03-10 NOTE — Progress Notes (Signed)
Received MD order to discharge patient to home, reviewed home meds, discharge instructions, follow up appointments and prescriptions with patient and patient verbalized understanding  

## 2020-03-17 ENCOUNTER — Other Ambulatory Visit (INDEPENDENT_AMBULATORY_CARE_PROVIDER_SITE_OTHER): Payer: Self-pay | Admitting: Nurse Practitioner

## 2020-03-17 DIAGNOSIS — I82432 Acute embolism and thrombosis of left popliteal vein: Secondary | ICD-10-CM

## 2020-03-23 ENCOUNTER — Encounter (INDEPENDENT_AMBULATORY_CARE_PROVIDER_SITE_OTHER): Payer: Self-pay | Admitting: Nurse Practitioner

## 2020-03-23 ENCOUNTER — Ambulatory Visit (INDEPENDENT_AMBULATORY_CARE_PROVIDER_SITE_OTHER): Payer: Managed Care, Other (non HMO) | Admitting: Nurse Practitioner

## 2020-03-23 ENCOUNTER — Ambulatory Visit (INDEPENDENT_AMBULATORY_CARE_PROVIDER_SITE_OTHER): Payer: Managed Care, Other (non HMO)

## 2020-03-23 ENCOUNTER — Other Ambulatory Visit: Payer: Self-pay

## 2020-03-23 VITALS — BP 124/79 | HR 87 | Ht 74.0 in | Wt 211.0 lb

## 2020-03-23 DIAGNOSIS — I82432 Acute embolism and thrombosis of left popliteal vein: Secondary | ICD-10-CM

## 2020-03-23 DIAGNOSIS — E785 Hyperlipidemia, unspecified: Secondary | ICD-10-CM

## 2020-03-23 DIAGNOSIS — I10 Essential (primary) hypertension: Secondary | ICD-10-CM | POA: Diagnosis not present

## 2020-03-28 ENCOUNTER — Encounter (INDEPENDENT_AMBULATORY_CARE_PROVIDER_SITE_OTHER): Payer: Self-pay | Admitting: Nurse Practitioner

## 2020-03-28 NOTE — Progress Notes (Signed)
Subjective:    Patient ID: Paul Mitchell, male    DOB: 1950/06/21, 70 y.o.   MRN: [id number] Chief Complaint  Patient presents with  . Follow-up    U/S    The patient presents today after hospitalization on 12 9 2021 for severe shortness of breath.  The patient had COVID-19 in early December 2021 and presented to the emergency room with worsening shortness of breath.  Subsequent ultrasound showed DVT in the left lower extremity.  The patient was placed on heparin and transitioned to Eliquis once he was discharged.  Today the patient notes that the swelling and discomfort in left lower extremity is nearly nonexistent.  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  The patient has also had a previous DVT in March 2008, after he drove 6+ hours in his personal car multiple times for a cheerleading competition within a short timeframe.  However they believe that this DVT was in his right lower extremity not his left.  He denies any issues currently with taking his Eliquis.  Today noninvasive study showed no evidence of DVT within the left lower extremity.   Review of Systems  Cardiovascular: Negative for leg swelling.  All other systems reviewed and are negative.      Objective:   Physical Exam Vitals reviewed.  HENT:     Head: Normocephalic.  Cardiovascular:     Rate and Rhythm: Normal rate.     Pulses: Normal pulses.  Pulmonary:     Effort: Pulmonary effort is normal. No respiratory distress.     Comments: Home o2 Neurological:     Mental Status: He is alert and oriented to person, place, and time.  Psychiatric:        Mood and Affect: Mood normal.        Behavior: Behavior normal.        Thought Content: Thought content normal.        Judgment: Judgment normal.     BP 124/79   Pulse 87   Ht 6\' 2"  (1.88 m)   Wt 211 lb (95.7 kg)   BMI 27.09 kg/m   Past Medical History:  Diagnosis Date  .  Hypertension     Social History   Socioeconomic History  . Marital status: Married    Spouse name: Not on file  . Number of children: Not on file  . Years of education: Not on file  . Highest education level: Not on file  Occupational History  . Not on file  Tobacco Use  . Smoking status: Never Smoker  . Smokeless tobacco: Never Used  Substance and Sexual Activity  . Alcohol use: Not Currently  . Drug use: Not Currently  . Sexual activity: Not on file  Other Topics Concern  . Not on file  Social History Narrative  . Not on file   Social Determinants of Health   Financial Resource Strain: Not on file  Food Insecurity: Not on file  Transportation Needs: Not on file  Physical Activity: Not on file  Stress: Not on file  Social Connections: Not on file  Intimate Partner Violence: Not on file    History reviewed. No pertinent surgical history.  History reviewed. No pertinent family history.  Allergies  Allergen Reactions  . Enalapril Cough    CBC Latest Ref Rng & Units 03/10/2020 03/09/2020 03/08/2020  WBC 4.0 - 10.5 K/uL 11.3(H) 11.3(H) 10.3  Hemoglobin  13.0 - 17.0 g/dL 12.7(L) 12.8(L) 13.3  Hematocrit 39.0 - 52.0 % 37.7(L) 37.5(L) 39.2  Platelets 150 - 400 K/uL 149(L) 160 153      CMP     Component Value Date/Time   NA 137 03/10/2020 0632   K 5.2 (H) 03/10/2020 0632   CL 102 03/10/2020 0632   CO2 28 03/10/2020 0632   GLUCOSE 94 03/10/2020 0632   BUN 32 (H) 03/10/2020 0632   CREATININE 1.19 03/10/2020 0632   CALCIUM 8.9 03/10/2020 0632   PROT 6.4 (L) 03/08/2020 0842   ALBUMIN 2.8 (L) 03/08/2020 0842   AST 19 03/08/2020 0842   ALT 27 03/08/2020 0842   ALKPHOS 44 03/08/2020 0842   BILITOT 0.9 03/08/2020 0842   GFRNONAA >60 03/10/2020 0632     No results found.     Assessment & Plan:   1. Deep vein thrombosis (DVT) of popliteal vein of left lower extremity, unspecified chronicity (HCC) Noninvasive studies have shown today that his DVT has  resolved at this time.  However, given the fact that the patient is still recovering from his Covid hospitalization in addition to this being his second DVT we will plan on anticoagulation for 6 months.  This can be shortened to 3 months if the patient has bleeding issues that may necessitate stopping his blood thinners.  We will plan on having the patient return to the office in 6 months to discuss anticoagulation cessation.  2. Essential hypertension Continue antihypertensive medications as already ordered, these medications have been reviewed and there are no changes at this time.   3. Hyperlipidemia, unspecified hyperlipidemia type Continue statin as ordered and reviewed, no changes at this time    Current Outpatient Medications on File Prior to Visit  Medication Sig Dispense Refill  . amiodarone (PACERONE) 200 MG tablet Take 1 tablet (200 mg total) by mouth 2 (two) times daily. 28 tablet 0  . apixaban (ELIQUIS) 5 MG TABS tablet Take 1 tablet (5 mg total) by mouth 2 (two) times daily. 60 tablet 0  . guaiFENesin-dextromethorphan (ROBITUSSIN DM) 100-10 MG/5ML syrup Take 10 mLs by mouth every 4 (four) hours as needed for cough. 118 mL 0  . Melatonin 5 MG TBDP Take by mouth.    Marland Kitchen albuterol (VENTOLIN HFA) 108 (90 Base) MCG/ACT inhaler Inhale 2 puffs into the lungs every 6 (six) hours as needed for wheezing or shortness of breath. (Patient not taking: Reported on 03/23/2020) 8 g 0  . apixaban (ELIQUIS) 5 MG TABS tablet Take 2 tablets (10 mg total) by mouth 2 (two) times daily for 2 days. 8 tablet 0  . aspirin EC 81 MG tablet Take 81 mg by mouth daily. (Patient not taking: Reported on 03/23/2020)    . benzonatate (TESSALON PERLES) 100 MG capsule Take 1 capsule (100 mg total) by mouth 3 (three) times daily as needed for cough. (Patient not taking: Reported on 03/23/2020) 30 capsule 0  . clotrimazole (MYCELEX) 10 MG troche Take 10 mg by mouth 5 (five) times daily.     No current  facility-administered medications on file prior to visit.    There are no Patient Instructions on file for this visit. No follow-ups on file.   Georgiana Spinner, NP

## 2020-04-05 ENCOUNTER — Telehealth (INDEPENDENT_AMBULATORY_CARE_PROVIDER_SITE_OTHER): Payer: Self-pay | Admitting: Nurse Practitioner

## 2020-04-05 NOTE — Telephone Encounter (Signed)
I spoke with Paul Plumber NP and she recommended in a vascular perspective that it will be up to the provider that placed the patient on oxygen to decide on when he can return back to work.Patient was made aware with medical recommendations and was informed once paper work is completed we will fax it over.

## 2020-04-05 NOTE — Telephone Encounter (Signed)
Called to check on the status of his short term disability paperwork.

## 2020-06-17 ENCOUNTER — Other Ambulatory Visit: Payer: Self-pay

## 2020-07-13 LAB — EXTERNAL GENERIC LAB PROCEDURE: COLOGUARD: NEGATIVE

## 2020-07-13 LAB — COLOGUARD: COLOGUARD: NEGATIVE

## 2020-09-19 ENCOUNTER — Ambulatory Visit (INDEPENDENT_AMBULATORY_CARE_PROVIDER_SITE_OTHER): Payer: Medicare Other | Admitting: Vascular Surgery

## 2020-10-13 ENCOUNTER — Other Ambulatory Visit: Payer: Self-pay

## 2020-10-13 ENCOUNTER — Encounter (INDEPENDENT_AMBULATORY_CARE_PROVIDER_SITE_OTHER): Payer: Self-pay | Admitting: Vascular Surgery

## 2020-10-13 ENCOUNTER — Ambulatory Visit (INDEPENDENT_AMBULATORY_CARE_PROVIDER_SITE_OTHER): Payer: Managed Care, Other (non HMO) | Admitting: Vascular Surgery

## 2020-10-13 VITALS — BP 139/86 | HR 72 | Resp 16 | Wt 228.4 lb

## 2020-10-13 DIAGNOSIS — I10 Essential (primary) hypertension: Secondary | ICD-10-CM | POA: Diagnosis not present

## 2020-10-13 DIAGNOSIS — I824Y2 Acute embolism and thrombosis of unspecified deep veins of left proximal lower extremity: Secondary | ICD-10-CM

## 2020-10-13 DIAGNOSIS — E785 Hyperlipidemia, unspecified: Secondary | ICD-10-CM

## 2020-10-13 DIAGNOSIS — I4891 Unspecified atrial fibrillation: Secondary | ICD-10-CM

## 2020-10-13 NOTE — Progress Notes (Signed)
MRN : [id number]  Paul Mitchell is a 70 y.o. (1950-05-27) male who presents with chief complaint of No chief complaint on file. Marland Kitchen  History of Present Illness:   The patient presents today for follow up of DVT.  The patient had COVID-19 in early December 2021 and presented to the emergency room with worsening shortness of breath.  Subsequent ultrasound showed DVT in the left lower extremity.  The patient was placed on heparin and transitioned to Eliquis once he was discharged.  Today the patient notes that the swelling and discomfort in left lower extremity is nearly nonexistent.  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  The patient has also had a previous DVT in March 2008.   He denies any issues currently with taking his Eliquis.   Today noninvasive study showed no evidence of DVT within the left lower extremity.  No outpatient medications have been marked as taking for the 10/13/20 encounter (Appointment) with Gilda Crease, Latina Craver, MD.    Past Medical History:  Diagnosis Date   Hypertension     No past surgical history on file.  Social History Social History   Tobacco Use   Smoking status: Never   Smokeless tobacco: Never  Substance Use Topics   Alcohol use: Not Currently   Drug use: Not Currently    Family History No family history on file.  Allergies  Allergen Reactions   Enalapril Cough     REVIEW OF SYSTEMS (Negative unless checked)  Constitutional: [] Weight loss  [] Fever  [] Chills Cardiac: [] Chest pain   [] Chest pressure   [] Palpitations   [] Shortness of breath when laying flat   [] Shortness of breath with exertion. Vascular:  [] Pain in legs with walking   [] Pain in legs at rest  [x] History of DVT   [] Phlebitis   [x] Swelling in legs   [] Varicose veins   [] Non-healing ulcers Pulmonary:   [] Uses home oxygen   [] Productive cough   [] Hemoptysis   [] Wheeze  [] COPD    [] Asthma Neurologic:  [] Dizziness   [] Seizures   [] History of stroke   [] History of TIA  [] Aphasia   [] Vissual changes   [] Weakness or numbness in arm   [] Weakness or numbness in leg Musculoskeletal:   [] Joint swelling   [] Joint pain   [] Low back pain Hematologic:  [] Easy bruising  [] Easy bleeding   [] Hypercoagulable state   [] Anemic Gastrointestinal:  [] Diarrhea   [] Vomiting  [] Gastroesophageal reflux/heartburn   [] Difficulty swallowing. Genitourinary:  [] Chronic kidney disease   [] Difficult urination  [] Frequent urination   [] Blood in urine Skin:  [] Rashes   [] Ulcers  Psychological:  [] History of anxiety   []  History of major depression.  Physical Examination  There were no vitals filed for this visit. There is no height or weight on file to calculate BMI. Gen: WD/WN, NAD Head: Central/AT, No temporalis wasting.  Ear/Nose/Throat: Hearing grossly intact, nares w/o erythema or drainage Eyes: PER, EOMI, sclera nonicteric.  Neck: Supple, no large masses.   Pulmonary:  Good air movement, no audible wheezing bilaterally, no use of accessory muscles.  Cardiac: RRR, no JVD Vascular:  scattered varicosities present bilaterally.  Mild venous stasis changes to the legs bilaterally.  Trace soft pitting edema  Vessel Right Left  Radial Palpable Palpable  PT Palpable Palpable  DP Palpable Palpable  Gastrointestinal: Non-distended. No guarding/no peritoneal signs.  Musculoskeletal: M/S 5/5 throughout.  No deformity or atrophy.  Neurologic:  CN 2-12 intact. Symmetrical.  Speech is fluent. Motor exam as listed above. Psychiatric: Judgment intact, Mood & affect appropriate for pt's clinical situation. Dermatologic: No rashes or ulcers noted.  No changes consistent with cellulitis. Lymph : No lichenification or skin changes of chronic lymphedema.  CBC Lab Results  Component Value Date   WBC 11.3 (H) 03/10/2020   HGB 12.7 (L) 03/10/2020   HCT 37.7 (L) 03/10/2020   MCV 91.3 03/10/2020   PLT 149 (L)  03/10/2020    BMET    Component Value Date/Time   NA 137 03/10/2020 0632   K 5.2 (H) 03/10/2020 0632   CL 102 03/10/2020 0632   CO2 28 03/10/2020 0632   GLUCOSE 94 03/10/2020 0632   BUN 32 (H) 03/10/2020 0632   CREATININE 1.19 03/10/2020 0632   CALCIUM 8.9 03/10/2020 0632   GFRNONAA >60 03/10/2020 0632   CrCl cannot be calculated (Patient's most recent lab result is older than the maximum 21 days allowed.).  COAG Lab Results  Component Value Date   INR 1.2 03/03/2020    Radiology No results found.   Assessment/Plan 1. Acute deep vein thrombosis (DVT) of proximal vein of left lower extremity (HCC) Recommend:   No surgery or intervention at this point in time.  IVC filter is not indicated at present.  Patient's duplex ultrasound of the venous system shows DVT from the popliteal to the femoral veins.  The patient is initiated on anticoagulation and at this time will go to Eliquis 2.5 mg po bid.  Elevation was stressed, use of a recliner was discussed.  I have had a long discussion with the patient regarding DVT and post phlebitic changes such as swelling and why it  causes symptoms such as pain.  The patient will  beginning wearing stockings first thing in the morning and removing them in the evening. The patient is instructed specifically not to sleep in the stockings.  In addition, behavioral modification including elevation during the day and avoidance of prolonged dependency will be initiated.  2. Atrial fibrillation with rapid ventricular response (HCC) Continue antiarrhythmia medications as already ordered, these medications have been reviewed and there are no changes at this time.  Continue anticoagulation as ordered by Cardiology Service   3. Essential hypertension Continue antihypertensive medications as already ordered, these medications have been reviewed and there are no changes at this time.   4. Hyperlipidemia, unspecified hyperlipidemia type Continue  statin as ordered and reviewed, no changes at this time     The patient will continue anticoagulation for now as there have not been any problems or complications at this point.     Levora Dredge, MD  10/13/2020 12:46 PM

## 2020-10-16 ENCOUNTER — Encounter (INDEPENDENT_AMBULATORY_CARE_PROVIDER_SITE_OTHER): Payer: Self-pay | Admitting: Vascular Surgery

## 2020-10-16 MED ORDER — APIXABAN 2.5 MG PO TABS: 2.5000 mg | ORAL_TABLET | Freq: Two times a day (BID) | ORAL | 3 refills | Status: DC

## 2020-11-04 ENCOUNTER — Telehealth (INDEPENDENT_AMBULATORY_CARE_PROVIDER_SITE_OTHER): Payer: Self-pay | Admitting: Vascular Surgery

## 2020-11-04 NOTE — Telephone Encounter (Signed)
I have submitted PA and it has been approved

## 2020-11-04 NOTE — Telephone Encounter (Signed)
Message was left on the patient voicemail that the medication has been approved

## 2020-11-04 NOTE — Telephone Encounter (Signed)
Called to make [place] aware that his pharmacy needs approval for Rx Refill for Eliquis 2.5. Patient was last seen 09/2020 72mo f/u with GS. Please advise.

## 2021-05-31 IMAGING — US US EXTREM LOW VENOUS
2 series · 13 of 24 positions shown · non-contrast
Comparison: None

CLINICAL DATA: Elevated D-dimer

EXAM:
BILATERAL LOWER EXTREMITY VENOUS DOPPLER ULTRASOUND
TECHNIQUE: Gray-scale sonography with compression, as well as color and duplex
ultrasound, were performed to evaluate the deep venous system(s)
from the level of the common femoral vein through the popliteal and
proximal calf veins.

[Series 1: us venous img lower bilat (dvt) · portal-venous · 5 of 28 slices shown]
[im 1/28]
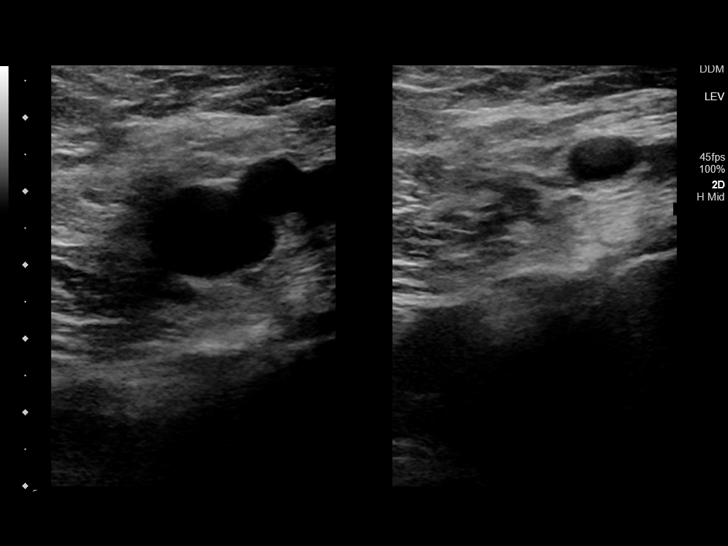
[im 7/28]
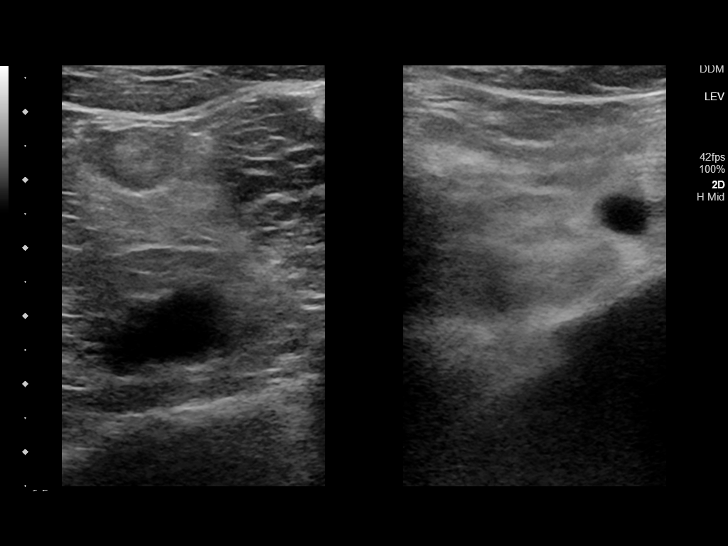
[im 13/28]
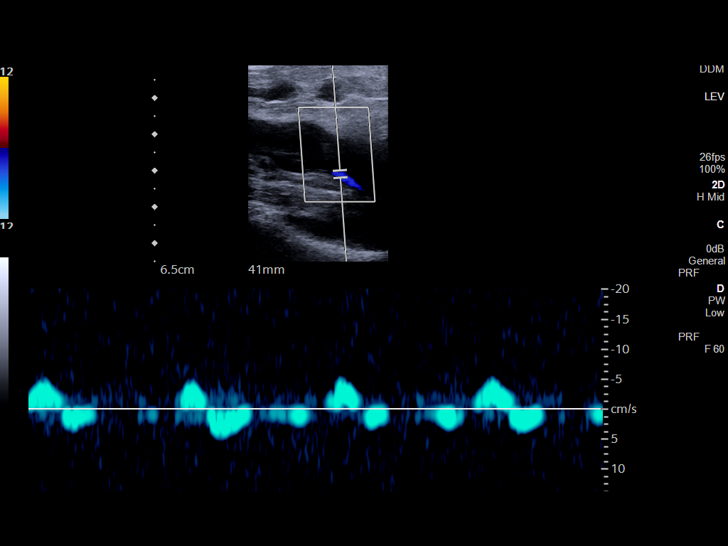
[im 19/28]
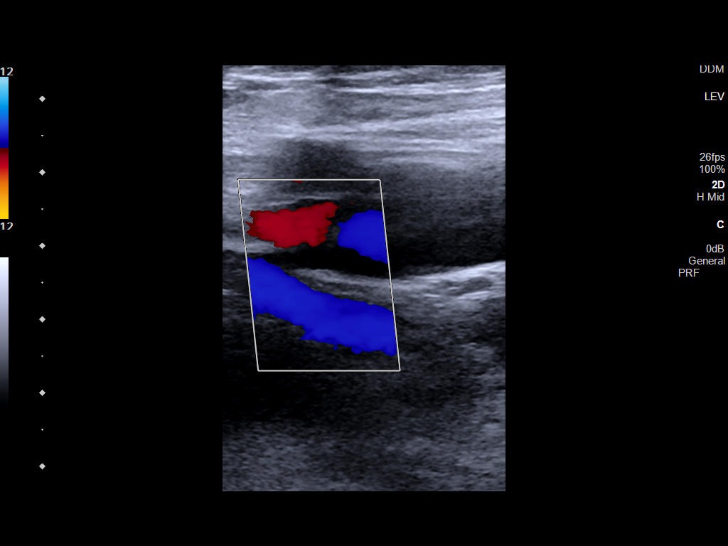
[im 25/28]
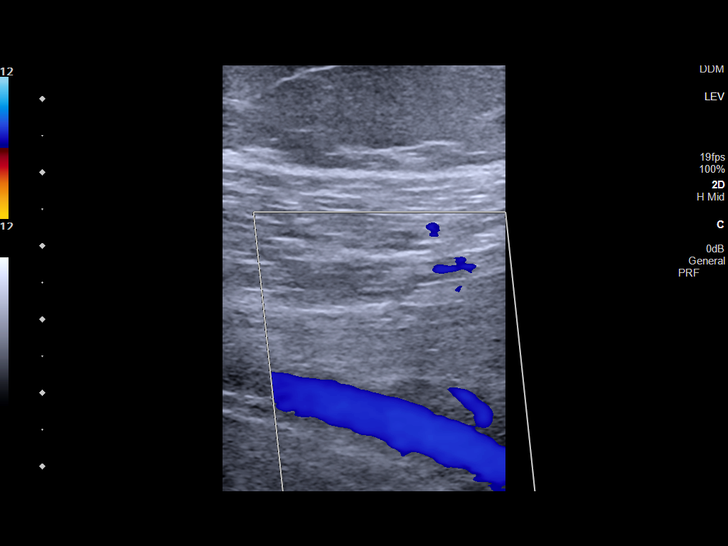

[Series 2: lev · 8 of 42 slices shown]
[im 1/42]
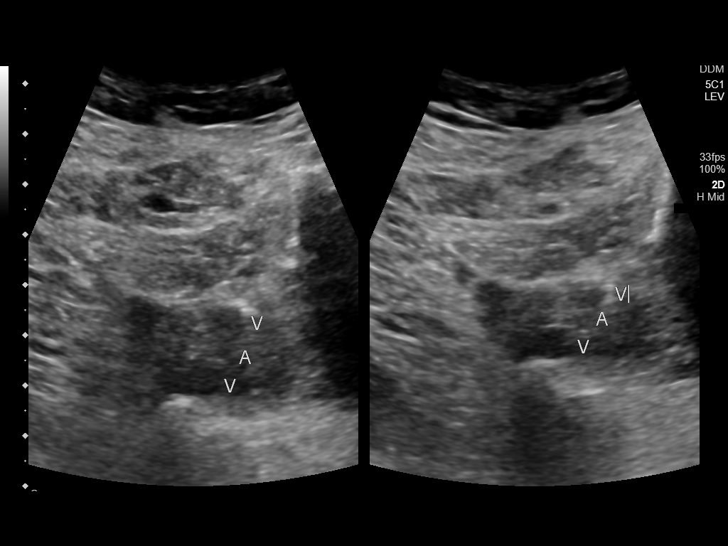
[im 7/42]
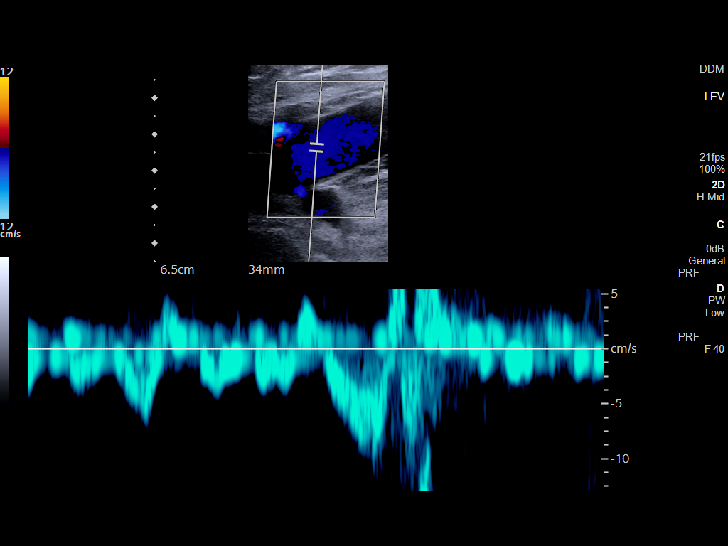
[im 10/42]
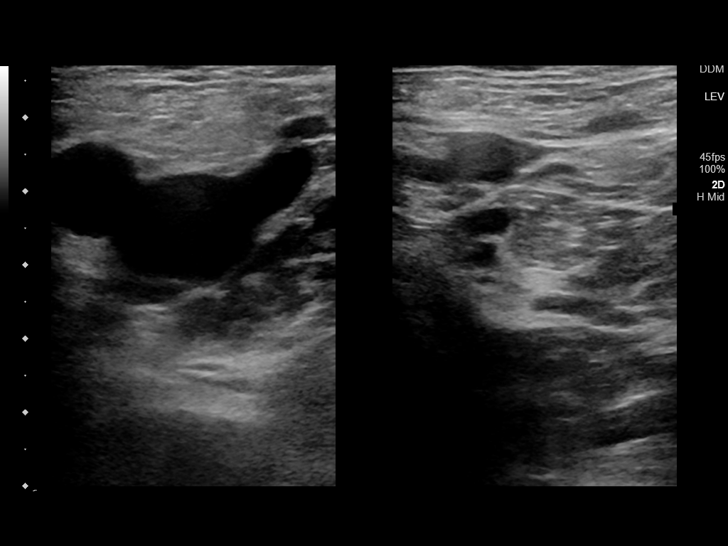
[im 16/42]
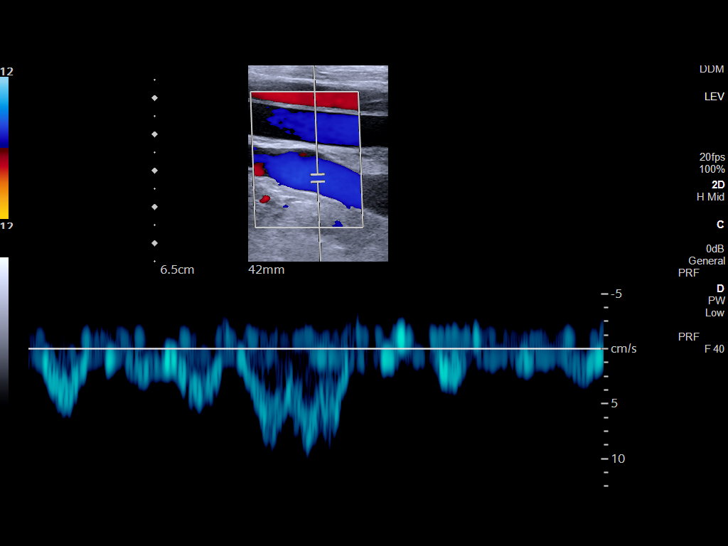
[im 23/42]
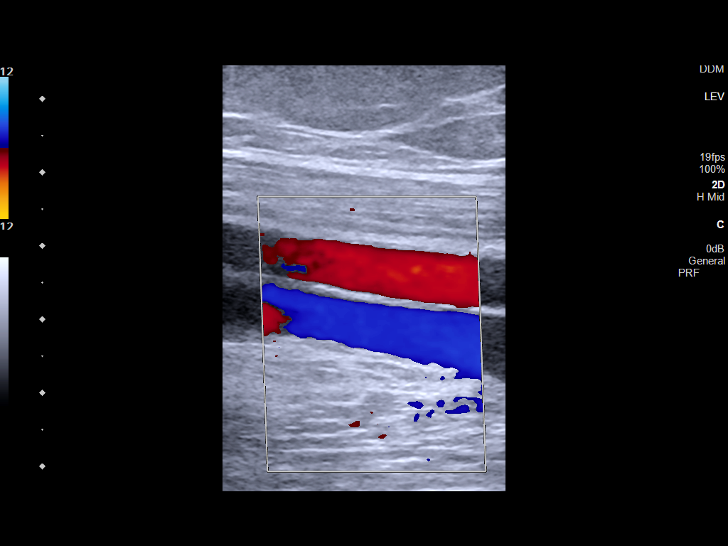
[im 29/42]
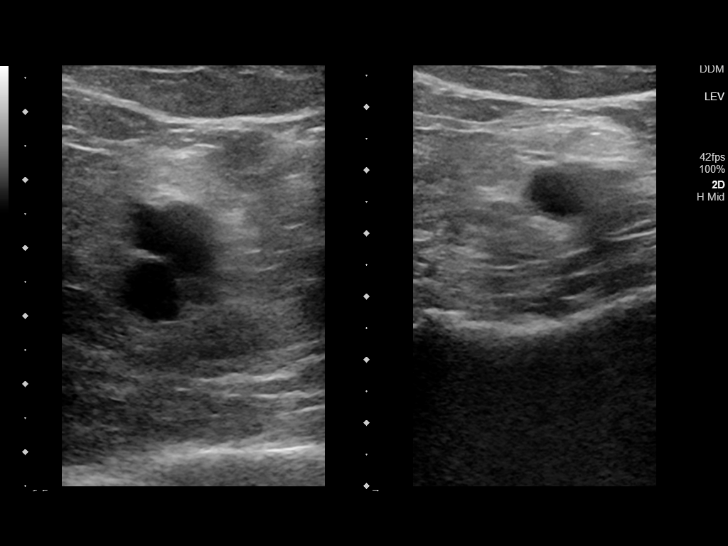
[im 35/42]
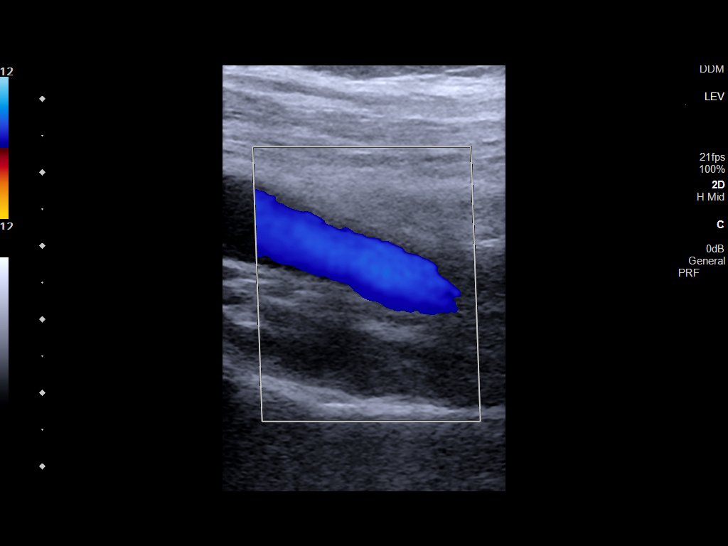
[im 42/42]
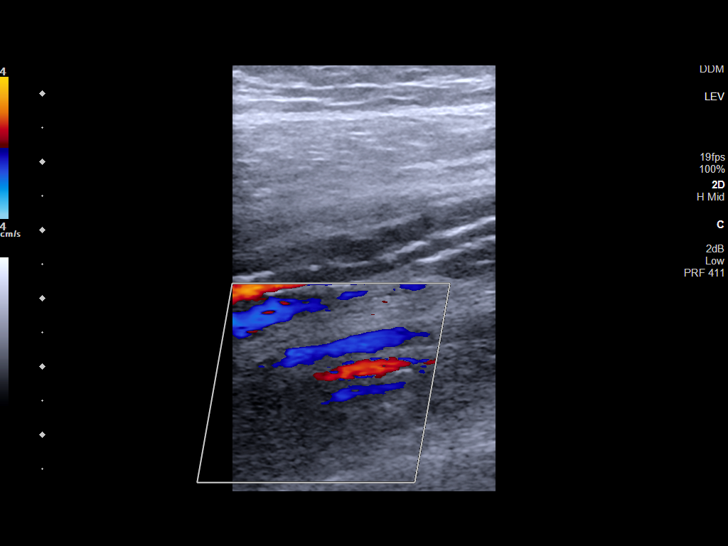

[13 of 24 positions shown; findings below may reference images not displayed]

FINDINGS: VENOUS

RIGHT lower extremity: Deep venous system is patent and compressible
from the RIGHT common femoral vein through the RIGHT popliteal vein
into visualized calf veins. Spontaneous venous flow is present with
intact augmentation. No intraluminal thrombus identified.

LEFT lower extremity: LEFT common femoral, profundus femoral, and
femoral veins are patent and compressible. Spontaneous flow is
present respiratory phasicity. No intraluminal thrombus identified.
However, at the LEFT popliteal vein and extending into proximal LEFT
calf veins nonocclusive intraluminal thrombus is identified
consistent with deep venous thrombosis. Diminished spontaneous
venous flow is seen and these veins are only partially compressible.

OTHER

None

Limitations: none
IMPRESSION: Positive exam for deep venous thrombosis in the LEFT lower extremity
involving the LEFT popliteal vein and proximal LEFT calf veins as
above.

No evidence of deep venous thrombosis in the RIGHT lower extremity.

## 2021-06-04 IMAGING — DX DG CHEST 1V PORT
1 series · 1 of 1 positions shown · non-contrast
Comparison: 03/03/2020

CLINICAL DATA: [id number] positivity with shortness of breath

EXAM:
PORTABLE CHEST 1 VIEW

[chest ap]
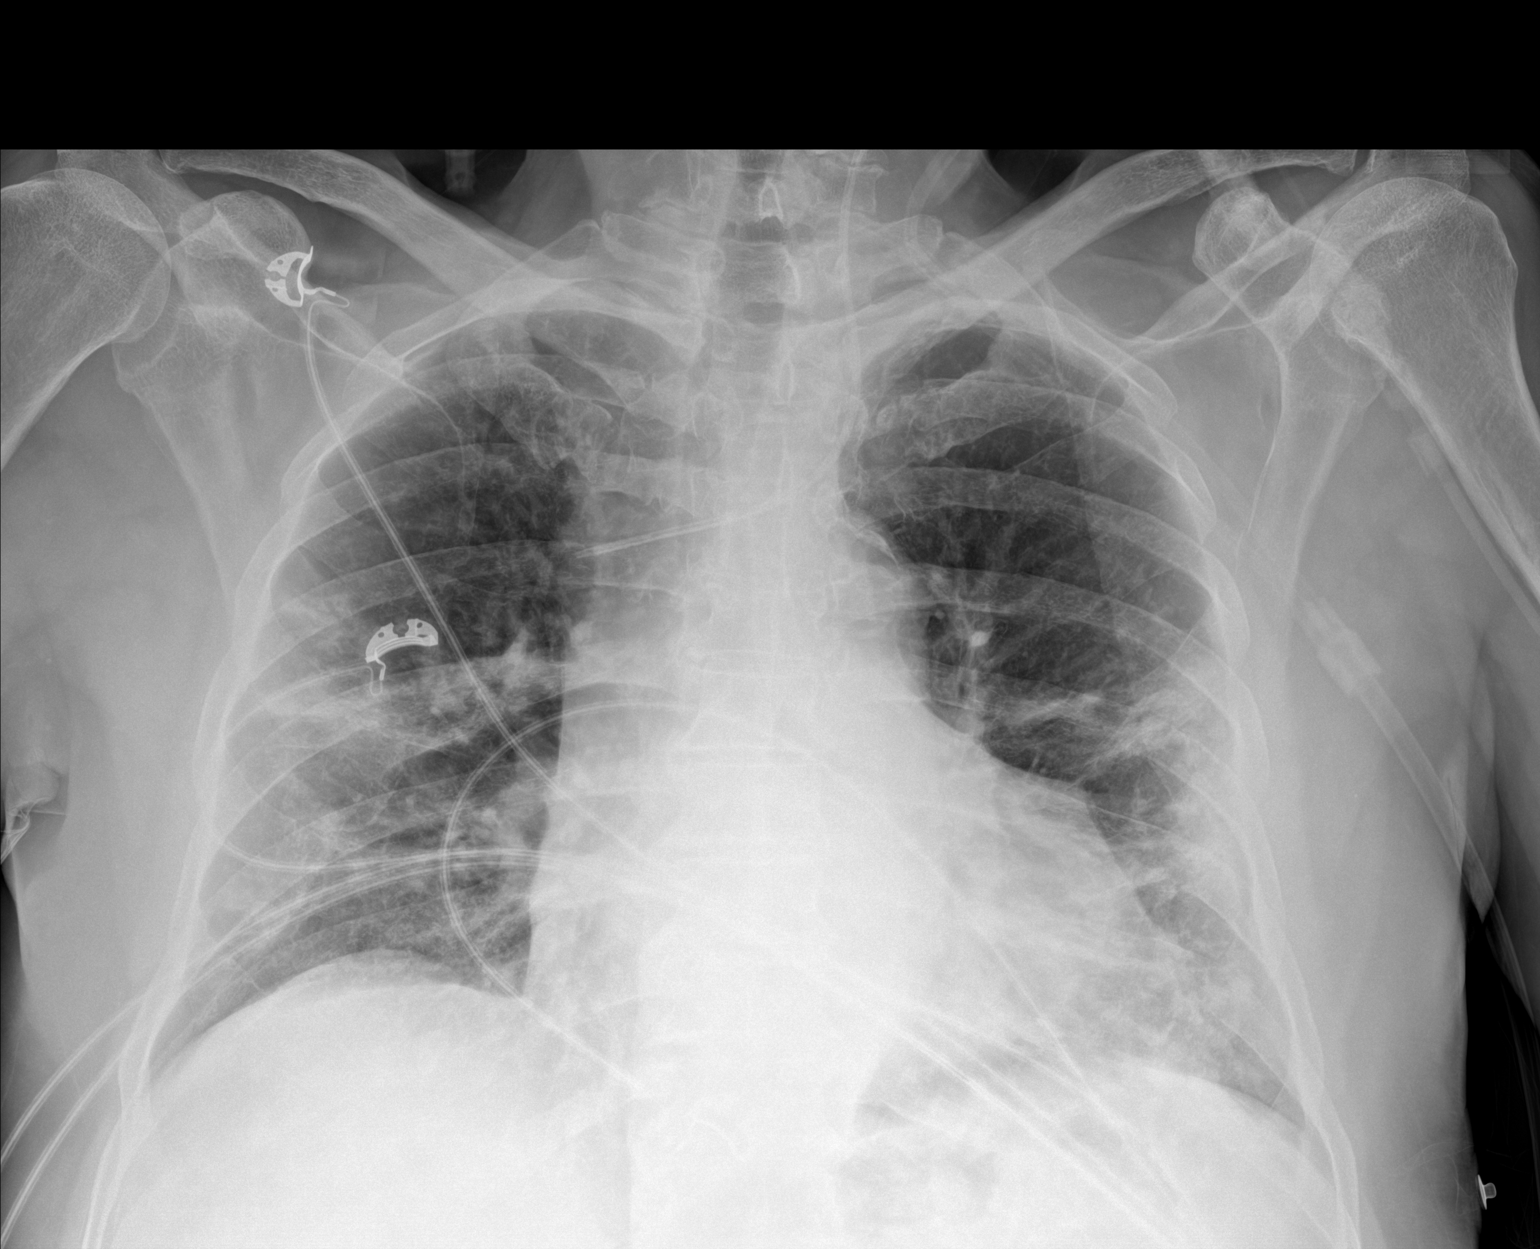

[1 of 1 positions shown; findings below may reference images not displayed]

FINDINGS: Cardiac shadow is stable. Patchy airspace opacities are again
identified bilaterally consistent with the given clinical history.
Left-sided jugular central line is again noted and stable. No bony
abnormality is seen.
IMPRESSION: Stable airspace opacities consistent with the given clinical
history.

## 2021-10-16 ENCOUNTER — Ambulatory Visit (INDEPENDENT_AMBULATORY_CARE_PROVIDER_SITE_OTHER): Payer: Medicare Other | Admitting: Vascular Surgery

## 2021-11-29 ENCOUNTER — Inpatient Hospital Stay (HOSPITAL_COMMUNITY)
Admission: EM | Admit: 2021-11-29 | Discharge: 2021-12-03 | DRG: 163 | Disposition: A | Discharge status: Home / Self Care | Payer: Managed Care, Other (non HMO) | Attending: Internal Medicine | Admitting: Internal Medicine

## 2021-11-29 ENCOUNTER — Other Ambulatory Visit (HOSPITAL_COMMUNITY): Payer: Managed Care, Other (non HMO)

## 2021-11-29 ENCOUNTER — Encounter (HOSPITAL_COMMUNITY): Payer: Self-pay

## 2021-11-29 ENCOUNTER — Emergency Department (HOSPITAL_COMMUNITY): Payer: Managed Care, Other (non HMO)

## 2021-11-29 ENCOUNTER — Other Ambulatory Visit: Payer: Self-pay

## 2021-11-29 ENCOUNTER — Telehealth (HOSPITAL_COMMUNITY): Payer: Self-pay | Admitting: Pharmacy Technician

## 2021-11-29 ENCOUNTER — Inpatient Hospital Stay (HOSPITAL_COMMUNITY): Payer: Managed Care, Other (non HMO)

## 2021-11-29 ENCOUNTER — Other Ambulatory Visit (HOSPITAL_COMMUNITY): Payer: Self-pay

## 2021-11-29 DIAGNOSIS — Z8616 Personal history of COVID-19: Secondary | ICD-10-CM

## 2021-11-29 DIAGNOSIS — Z86711 Personal history of pulmonary embolism: Secondary | ICD-10-CM

## 2021-11-29 DIAGNOSIS — Z20822 Contact with and (suspected) exposure to covid-19: Secondary | ICD-10-CM | POA: Diagnosis present

## 2021-11-29 DIAGNOSIS — L7632 Postprocedural hematoma of skin and subcutaneous tissue following other procedure: Secondary | ICD-10-CM | POA: Diagnosis not present

## 2021-11-29 DIAGNOSIS — D649 Anemia, unspecified: Secondary | ICD-10-CM | POA: Diagnosis present

## 2021-11-29 DIAGNOSIS — Z23 Encounter for immunization: Secondary | ICD-10-CM

## 2021-11-29 DIAGNOSIS — I248 Other forms of acute ischemic heart disease: Secondary | ICD-10-CM | POA: Diagnosis present

## 2021-11-29 DIAGNOSIS — E663 Overweight: Secondary | ICD-10-CM | POA: Diagnosis present

## 2021-11-29 DIAGNOSIS — Y838 Other surgical procedures as the cause of abnormal reaction of the patient, or of later complication, without mention of misadventure at the time of the procedure: Secondary | ICD-10-CM | POA: Diagnosis not present

## 2021-11-29 DIAGNOSIS — W228XXA Striking against or struck by other objects, initial encounter: Secondary | ICD-10-CM | POA: Diagnosis present

## 2021-11-29 DIAGNOSIS — J9601 Acute respiratory failure with hypoxia: Secondary | ICD-10-CM | POA: Diagnosis present

## 2021-11-29 DIAGNOSIS — S0181XA Laceration without foreign body of other part of head, initial encounter: Secondary | ICD-10-CM | POA: Diagnosis present

## 2021-11-29 DIAGNOSIS — K219 Gastro-esophageal reflux disease without esophagitis: Secondary | ICD-10-CM | POA: Diagnosis present

## 2021-11-29 DIAGNOSIS — D72829 Elevated white blood cell count, unspecified: Secondary | ICD-10-CM | POA: Diagnosis not present

## 2021-11-29 DIAGNOSIS — I2692 Saddle embolus of pulmonary artery without acute cor pulmonale: Secondary | ICD-10-CM | POA: Diagnosis present

## 2021-11-29 DIAGNOSIS — I480 Paroxysmal atrial fibrillation: Secondary | ICD-10-CM | POA: Diagnosis present

## 2021-11-29 DIAGNOSIS — E8809 Other disorders of plasma-protein metabolism, not elsewhere classified: Secondary | ICD-10-CM | POA: Diagnosis present

## 2021-11-29 DIAGNOSIS — Z79899 Other long term (current) drug therapy: Secondary | ICD-10-CM

## 2021-11-29 DIAGNOSIS — I2699 Other pulmonary embolism without acute cor pulmonale: Secondary | ICD-10-CM | POA: Diagnosis present

## 2021-11-29 DIAGNOSIS — R55 Syncope and collapse: Secondary | ICD-10-CM | POA: Diagnosis present

## 2021-11-29 DIAGNOSIS — E785 Hyperlipidemia, unspecified: Secondary | ICD-10-CM | POA: Diagnosis present

## 2021-11-29 DIAGNOSIS — S0993XA Unspecified injury of face, initial encounter: Secondary | ICD-10-CM

## 2021-11-29 DIAGNOSIS — I5022 Chronic systolic (congestive) heart failure: Secondary | ICD-10-CM | POA: Diagnosis present

## 2021-11-29 DIAGNOSIS — E876 Hypokalemia: Secondary | ICD-10-CM | POA: Diagnosis present

## 2021-11-29 DIAGNOSIS — Z86718 Personal history of other venous thrombosis and embolism: Secondary | ICD-10-CM | POA: Diagnosis not present

## 2021-11-29 DIAGNOSIS — Z7901 Long term (current) use of anticoagulants: Secondary | ICD-10-CM | POA: Diagnosis not present

## 2021-11-29 DIAGNOSIS — S0993XS Unspecified injury of face, sequela: Secondary | ICD-10-CM | POA: Diagnosis not present

## 2021-11-29 DIAGNOSIS — Z7982 Long term (current) use of aspirin: Secondary | ICD-10-CM

## 2021-11-29 DIAGNOSIS — I2609 Other pulmonary embolism with acute cor pulmonale: Secondary | ICD-10-CM | POA: Diagnosis not present

## 2021-11-29 DIAGNOSIS — Z6829 Body mass index (BMI) 29.0-29.9, adult: Secondary | ICD-10-CM

## 2021-11-29 DIAGNOSIS — I110 Hypertensive heart disease with heart failure: Secondary | ICD-10-CM | POA: Diagnosis present

## 2021-11-29 DIAGNOSIS — Z888 Allergy status to other drugs, medicaments and biological substances status: Secondary | ICD-10-CM | POA: Diagnosis not present

## 2021-11-29 HISTORY — PX: IR US GUIDE VASC ACCESS RIGHT: IMG2390

## 2021-11-29 HISTORY — PX: IR THROMBECT PRIM MECH INIT (INCLU) MOD SED: IMG2297

## 2021-11-29 HISTORY — PX: IR ANGIOGRAM SELECTIVE EACH ADDITIONAL VESSEL: IMG667

## 2021-11-29 HISTORY — PX: IR ANGIOGRAM PULMONARY BILATERAL SELECTIVE: IMG664

## 2021-11-29 LAB — BLOOD GAS, VENOUS
Acid-Base Excess: 2.3 mmol/L — ABNORMAL HIGH (ref 0.0–2.0)
Bicarbonate: 26.5 mmol/L (ref 20.0–28.0)
O2 Saturation: 85.1 %
Patient temperature: 37
pCO2, Ven: 39 mmHg — ABNORMAL LOW (ref 44–60)
pH, Ven: 7.44 — ABNORMAL HIGH (ref 7.25–7.43)
pO2, Ven: 51 mmHg — ABNORMAL HIGH (ref 32–45)

## 2021-11-29 LAB — PROCALCITONIN: Procalcitonin: 0.27 ng/mL

## 2021-11-29 LAB — CBC WITH DIFFERENTIAL/PLATELET
Abs Immature Granulocytes: 0.1 10*3/uL — ABNORMAL HIGH (ref 0.00–0.07)
Basophils Absolute: 0.1 10*3/uL (ref 0.0–0.1)
Basophils Relative: 0 %
Eosinophils Absolute: 0.3 10*3/uL (ref 0.0–0.5)
Eosinophils Relative: 2 %
HCT: 41.5 % (ref 39.0–52.0)
Hemoglobin: 14.1 g/dL (ref 13.0–17.0)
Immature Granulocytes: 1 %
Lymphocytes Relative: 19 %
Lymphs Abs: 3.5 10*3/uL (ref 0.7–4.0)
MCH: 29.9 pg (ref 26.0–34.0)
MCHC: 34 g/dL (ref 30.0–36.0)
MCV: 88.1 fL (ref 80.0–100.0)
Monocytes Absolute: 1.7 10*3/uL — ABNORMAL HIGH (ref 0.1–1.0)
Monocytes Relative: 9 %
Neutro Abs: 13.4 10*3/uL — ABNORMAL HIGH (ref 1.7–7.7)
Neutrophils Relative %: 69 %
Platelets: 158 10*3/uL (ref 150–400)
RBC: 4.71 MIL/uL (ref 4.22–5.81)
RDW: 13.4 % (ref 11.5–15.5)
WBC: 19.1 10*3/uL — ABNORMAL HIGH (ref 4.0–10.5)
nRBC: 0 % (ref 0.0–0.2)

## 2021-11-29 LAB — TROPONIN I (HIGH SENSITIVITY)
Troponin I (High Sensitivity): 140 ng/L (ref <18)
Troponin I (High Sensitivity): 984 ng/L (ref <18)

## 2021-11-29 LAB — I-STAT CHEM 8, ED
BUN: 20 mg/dL (ref 8–23)
Calcium, Ion: 1.04 mmol/L — ABNORMAL LOW (ref 1.15–1.40)
Chloride: 100 mmol/L (ref 98–111)
Creatinine, Ser: 1.2 mg/dL (ref 0.61–1.24)
Glucose, Bld: 216 mg/dL — ABNORMAL HIGH (ref 70–99)
HCT: 41 % (ref 39.0–52.0)
Hemoglobin: 13.9 g/dL (ref 13.0–17.0)
Potassium: 3 mmol/L — ABNORMAL LOW (ref 3.5–5.1)
Sodium: 139 mmol/L (ref 135–145)
TCO2: 25 mmol/L (ref 22–32)

## 2021-11-29 LAB — COMPREHENSIVE METABOLIC PANEL
ALT: 15 U/L (ref 0–44)
AST: 21 U/L (ref 15–41)
Albumin: 3.3 g/dL — ABNORMAL LOW (ref 3.5–5.0)
Alkaline Phosphatase: 84 U/L (ref 38–126)
Anion gap: 10 (ref 5–15)
BUN: 18 mg/dL (ref 8–23)
CO2: 24 mmol/L (ref 22–32)
Calcium: 8.2 mg/dL — ABNORMAL LOW (ref 8.9–10.3)
Chloride: 103 mmol/L (ref 98–111)
Creatinine, Ser: 1.35 mg/dL — ABNORMAL HIGH (ref 0.61–1.24)
GFR, Estimated: 57 mL/min — ABNORMAL LOW (ref >60)
Glucose, Bld: 212 mg/dL — ABNORMAL HIGH (ref 70–99)
Potassium: 2.8 mmol/L — ABNORMAL LOW (ref 3.5–5.1)
Sodium: 137 mmol/L (ref 135–145)
Total Bilirubin: 1 mg/dL (ref 0.3–1.2)
Total Protein: 6.1 g/dL — ABNORMAL LOW (ref 6.5–8.1)

## 2021-11-29 LAB — URINALYSIS, ROUTINE W REFLEX MICROSCOPIC
Bilirubin Urine: NEGATIVE
Glucose, UA: NEGATIVE mg/dL
Hgb urine dipstick: NEGATIVE
Ketones, ur: NEGATIVE mg/dL
Leukocytes,Ua: NEGATIVE
Nitrite: NEGATIVE
Protein, ur: NEGATIVE mg/dL
Specific Gravity, Urine: 1.008 (ref 1.005–1.030)
pH: 6 (ref 5.0–8.0)

## 2021-11-29 LAB — HEPARIN LEVEL (UNFRACTIONATED): Heparin Unfractionated: >1.1 IU/mL — ABNORMAL HIGH (ref 0.30–0.70)

## 2021-11-29 LAB — RESP PANEL BY RT-PCR (FLU A&B, COVID) ARPGX2
Influenza A by PCR: NEGATIVE
Influenza B by PCR: NEGATIVE
SARS Coronavirus 2 by RT PCR: NEGATIVE

## 2021-11-29 LAB — BASIC METABOLIC PANEL
Anion gap: 10 (ref 5–15)
BUN: 15 mg/dL (ref 8–23)
CO2: 28 mmol/L (ref 22–32)
Calcium: 8 mg/dL — ABNORMAL LOW (ref 8.9–10.3)
Chloride: 102 mmol/L (ref 98–111)
Creatinine, Ser: 1.28 mg/dL — ABNORMAL HIGH (ref 0.61–1.24)
GFR, Estimated: >60 mL/min (ref >60)
Glucose, Bld: 178 mg/dL — ABNORMAL HIGH (ref 70–99)
Potassium: 3.6 mmol/L (ref 3.5–5.1)
Sodium: 140 mmol/L (ref 135–145)

## 2021-11-29 LAB — PROTIME-INR
INR: 1.4 — ABNORMAL HIGH (ref 0.8–1.2)
Prothrombin Time: 17.3 seconds — ABNORMAL HIGH (ref 11.4–15.2)

## 2021-11-29 LAB — TYPE AND SCREEN
ABO/RH(D): A NEG
Antibody Screen: NEGATIVE

## 2021-11-29 LAB — MAGNESIUM: Magnesium: 1.7 mg/dL (ref 1.7–2.4)

## 2021-11-29 LAB — LACTIC ACID, PLASMA
Lactic Acid, Venous: 2.2 mmol/L (ref 0.5–1.9)
Lactic Acid, Venous: 2.9 mmol/L (ref 0.5–1.9)

## 2021-11-29 LAB — APTT: aPTT: 129 seconds — ABNORMAL HIGH (ref 24–36)

## 2021-11-29 LAB — MRSA NEXT GEN BY PCR, NASAL: MRSA by PCR Next Gen: NOT DETECTED

## 2021-11-29 LAB — CBG MONITORING, ED: Glucose-Capillary: 198 mg/dL — ABNORMAL HIGH (ref 70–99)

## 2021-11-29 LAB — HEPARIN ANTI-XA: Heparin LMW: 0.45 IU/mL

## 2021-11-29 LAB — ABO/RH: ABO/RH(D): A NEG

## 2021-11-29 LAB — BRAIN NATRIURETIC PEPTIDE: B Natriuretic Peptide: 36.7 pg/mL (ref 0.0–100.0)

## 2021-11-29 LAB — GLUCOSE, CAPILLARY: Glucose-Capillary: 161 mg/dL — ABNORMAL HIGH (ref 70–99)

## 2021-11-29 LAB — CK: Total CK: 241 U/L (ref 49–397)

## 2021-11-29 LAB — HIV ANTIBODY (ROUTINE TESTING W REFLEX): HIV Screen 4th Generation wRfx: NONREACTIVE

## 2021-11-29 MED ORDER — HEPARIN (PORCINE) 25000 UT/250ML-% IV SOLN
1600.0000 [IU]/h | INTRAVENOUS | Status: DC
  Administered 2021-11-29: 1600 [IU]/h via INTRAVENOUS
  Filled 2021-11-29: qty 250

## 2021-11-29 MED ORDER — SODIUM CHLORIDE 0.9 % IV SOLN: INTRAVENOUS | Status: DC

## 2021-11-29 MED ORDER — HEPARIN SODIUM (PORCINE) 1000 UNIT/ML IJ SOLN
INTRAMUSCULAR | Status: AC | PRN
  Administered 2021-11-29: 8000 [IU] via INTRAVENOUS

## 2021-11-29 MED ORDER — POTASSIUM CHLORIDE CRYS ER 20 MEQ PO TBCR
40.0000 meq | EXTENDED_RELEASE_TABLET | Freq: Once | ORAL | Status: AC
  Administered 2021-11-29: 40 meq via ORAL
  Filled 2021-11-29: qty 2

## 2021-11-29 MED ORDER — LIDOCAINE-EPINEPHRINE 1 %-1:100000 IJ SOLN
10.0000 mL | Freq: Once | INTRAMUSCULAR | Status: AC
  Administered 2021-11-29: 10 mL via INTRADERMAL
  Filled 2021-11-29: qty 1

## 2021-11-29 MED ORDER — MIDAZOLAM HCL 2 MG/2ML IJ SOLN
INTRAMUSCULAR | Status: AC | PRN
  Administered 2021-11-29: 1 mg via INTRAVENOUS

## 2021-11-29 MED ORDER — MIDAZOLAM HCL 2 MG/2ML IJ SOLN
INTRAMUSCULAR | Status: AC
  Filled 2021-11-29: qty 2

## 2021-11-29 MED ORDER — DOCUSATE SODIUM 100 MG PO CAPS: 100.0000 mg | ORAL_CAPSULE | Freq: Two times a day (BID) | ORAL | Status: DC | PRN

## 2021-11-29 MED ORDER — LIDOCAINE HCL 1 % IJ SOLN
INTRAMUSCULAR | Status: AC
  Filled 2021-11-29: qty 20

## 2021-11-29 MED ORDER — HEPARIN (PORCINE) 25000 UT/250ML-% IV SOLN
1900.0000 [IU]/h | INTRAVENOUS | Status: DC
Start: 2021-11-29 — End: 2021-12-02
  Administered 2021-11-29: 1400 [IU]/h via INTRAVENOUS
  Administered 2021-12-01 – 2021-12-02 (×3): 1900 [IU]/h via INTRAVENOUS
  Filled 2021-11-29 (×5): qty 250

## 2021-11-29 MED ORDER — LIDOCAINE HCL (PF) 1 % IJ SOLN
INTRAMUSCULAR | Status: AC | PRN
  Administered 2021-11-29: 10 mL

## 2021-11-29 MED ORDER — ONDANSETRON HCL 4 MG/2ML IJ SOLN
4.0000 mg | Freq: Once | INTRAMUSCULAR | Status: AC
  Administered 2021-11-29: 4 mg via INTRAVENOUS
  Filled 2021-11-29: qty 2

## 2021-11-29 MED ORDER — FENTANYL CITRATE (PF) 100 MCG/2ML IJ SOLN
INTRAMUSCULAR | Status: AC
  Filled 2021-11-29: qty 2

## 2021-11-29 MED ORDER — SODIUM CHLORIDE 0.9 % IV BOLUS
INTRAVENOUS | Status: AC | PRN
  Administered 2021-11-29 (×2): 250 mL via INTRAVENOUS

## 2021-11-29 MED ORDER — ACETAMINOPHEN 325 MG PO TABS
650.0000 mg | ORAL_TABLET | Freq: Four times a day (QID) | ORAL | Status: DC | PRN
  Administered 2021-11-29 – 2021-12-02 (×7): 650 mg via ORAL
  Filled 2021-11-29 (×7): qty 2

## 2021-11-29 MED ORDER — IOHEXOL 300 MG/ML  SOLN
100.0000 mL | Freq: Once | INTRAMUSCULAR | Status: AC | PRN
Start: 2021-11-29 — End: 2021-11-29
  Administered 2021-11-29: 75 mL via INTRA_ARTERIAL

## 2021-11-29 MED ORDER — TETANUS-DIPHTH-ACELL PERTUSSIS 5-2.5-18.5 LF-MCG/0.5 IM SUSY
0.5000 mL | PREFILLED_SYRINGE | Freq: Once | INTRAMUSCULAR | Status: AC
Start: 2021-11-29 — End: 2021-11-29
  Administered 2021-11-29: 0.5 mL via INTRAMUSCULAR
  Filled 2021-11-29: qty 0.5

## 2021-11-29 MED ORDER — FENTANYL CITRATE (PF) 100 MCG/2ML IJ SOLN
INTRAMUSCULAR | Status: AC | PRN
  Administered 2021-11-29: 25 ug via INTRAVENOUS

## 2021-11-29 MED ORDER — MAGNESIUM SULFATE 2 GM/50ML IV SOLN
2.0000 g | Freq: Once | INTRAVENOUS | Status: AC
  Administered 2021-11-29: 2 g via INTRAVENOUS
  Filled 2021-11-29: qty 50

## 2021-11-29 MED ORDER — POLYETHYLENE GLYCOL 3350 17 G PO PACK: 17.0000 g | PACK | Freq: Every day | ORAL | Status: DC | PRN

## 2021-11-29 MED ORDER — ORAL CARE MOUTH RINSE: 15.0000 mL | OROMUCOSAL | Status: DC | PRN

## 2021-11-29 MED ORDER — SODIUM CHLORIDE 0.9 % IV BOLUS
1000.0000 mL | Freq: Once | INTRAVENOUS | Status: AC
  Administered 2021-11-29: 1000 mL via INTRAVENOUS

## 2021-11-29 MED ORDER — HEPARIN SODIUM (PORCINE) 1000 UNIT/ML IJ SOLN
INTRAMUSCULAR | Status: AC
  Filled 2021-11-29: qty 10

## 2021-11-29 MED ORDER — PANTOPRAZOLE SODIUM 40 MG PO TBEC
40.0000 mg | DELAYED_RELEASE_TABLET | Freq: Every day | ORAL | Status: DC
  Administered 2021-11-29 – 2021-12-03 (×5): 40 mg via ORAL
  Filled 2021-11-29 (×6): qty 1

## 2021-11-29 MED ORDER — HEPARIN BOLUS VIA INFUSION
4000.0000 [IU] | Freq: Once | INTRAVENOUS | Status: AC
  Administered 2021-11-29: 4000 [IU] via INTRAVENOUS
  Filled 2021-11-29: qty 4000

## 2021-11-29 MED ORDER — IOHEXOL 350 MG/ML SOLN
100.0000 mL | Freq: Once | INTRAVENOUS | Status: AC | PRN
  Administered 2021-11-29: 100 mL via INTRAVENOUS

## 2021-11-29 MED ORDER — CHLORHEXIDINE GLUCONATE CLOTH 2 % EX PADS
6.0000 | MEDICATED_PAD | Freq: Every day | CUTANEOUS | Status: DC
  Administered 2021-11-30 – 2021-12-01 (×2): 6 via TOPICAL

## 2021-11-29 NOTE — ED Notes (Signed)
Attempted 6L [place] and only brought oxygen level to 88% placed on NRB and maintaining oxygen sats in high 90s

## 2021-11-29 NOTE — Progress Notes (Signed)
ANTICOAGULATION CONSULT NOTE - Initial Consult  Pharmacy Consult for heparin  Indication: pulmonary embolus  Allergies  Allergen Reactions   Enalapril Cough    Patient Measurements: Height: 6\' 2"  (188 cm) Weight: 103.4 kg (228 lb) IBW/kg (Calculated) : 82.2 Heparin Dosing Weight: 103kg   Vital Signs: Temp: 98.8 F (37.1 C) (09/06 1557) Temp Source: Axillary (09/06 1557) BP: 126/81 (09/06 1500) Pulse Rate: 100 (09/06 1500)  Labs: Recent Labs    11/29/21 0625 11/29/21 0640 11/29/21 0838 11/29/21 0929 11/29/21 1510  HGB 14.1 13.9  --   --   --   HCT 41.5 41.0  --   --   --   PLT 158  --   --   --   --   HEPARINUNFRC  --   --   --   --  >1.10*  CREATININE 1.35* 1.20  --  1.28*  --   CKTOTAL 241  --   --   --   --   TROPONINIHS 140*  --  984*  --   --      Estimated Creatinine Clearance: 70.9 mL/min (A) (by C-G formula based on SCr of 1.28 mg/dL (H)).   Medical History: Past Medical History:  Diagnosis Date   Hypertension    Assessment: Patient is a 71 y.o M admitted for a chief complaint of syncope. CBC and renal function are stable, no bleeding is noted. CTA + for acute PE with evidence of heart strain consistent with submassive PE.   Patient on no previous anticoagulation, and has received none while admitted.   Heparin level elevated >1.1  Goal of Therapy:  Heparin level 0.3-0.7 units/ml Monitor platelets by anticoagulation protocol: Yes   Plan:  Hold heparin for 1 hour Decrease heparin infusion at 1400 units/hr Check anti-Xa level in 6 hours and daily while on heparin Continue to monitor H&H and platelets  75, PharmD, [facility] Clinical Pharmacist Please see AMION for all Pharmacists' Contact Phone Numbers 11/29/2021, 4:04 PM

## 2021-11-29 NOTE — TOC Benefit Eligibility Note (Signed)
Patient Product/process development scientist completed.    The patient is currently admitted and upon discharge could be taking Eliquis 5 mg.  The current 30 day co-pay is $35.00.   The patient is currently admitted and upon discharge could be taking Xarelto 20 mg.  The current 30 day co-pay is $35.00.   The patient is insured through [employer]     Roland Earl, CPhT Pharmacy Patient Advocate Specialist [facility] Patient Advocate Team Direct Number: [phone]  Fax: [phone]

## 2021-11-29 NOTE — Sedation Documentation (Signed)
Trial: patient switched form NRB at 15lpm to 5lpm via [place].

## 2021-11-29 NOTE — Sedation Documentation (Signed)
Dr. Loreta Ave notified of patients Lactic Acid of 2.2  No new orders received. Pt currently has NS infusing at 80mL/hr

## 2021-11-29 NOTE — Consult Note (Signed)
Chief Complaint: Patient was seen in consultation today for pulmonary embolus  Referring Physician(s): Dr. Cheri Fowler  Supervising Physician: Gilmer Mor  Patient Status: [facility] - In-pt  History of Present Illness: Paul Mitchell is a 71 y.o. male with past medical history of HTN, prior hypoxemic respiratory failure due to COVID-19, a fib with RVR, AKI, and DVT on anticoagulation stopped as of April 2022 under the care of his Vascular surgeon, Dr. Gilda Crease. 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  Patient does not remember the event, only waking and being surrounded by coworkers who were attempting to get help. Reports his routes are less than 2 hrs. He does have chronic leg swelling and has not noticed any worsening of this.  No additional leg cramps.  No report of shortness of breath.  Upon arrival to [facility] ED, he was satting 78% and required NRB. CTA Chest showed bilateral acute arterial emboli with large clot burden and right heart strain. IR consulted for possible intervention at the request of Dr. Merrily Pew.  Bedside ECHO by CCM confirmed right heart strain which is further evidenced by troponin leak at 140.   Patient assessed at bedside alongside Dr. Loreta Ave.  He is alert, oriented.  Remains on NRB with HR 102, RR 19-22.  He converses easily with NRB and is able to tolerate a semi-reclined position during visit.  He has considerable injuries to the right face with a large laceration which has been sutured.  Large area of swelling and bruising under along the right cheek bone. CT Head negative. Discussed held between patient, family, and Dr. Loreta Ave regarding the merits of proceeding with thrombectomy.   Past Medical History:  Diagnosis Date   Hypertension     History reviewed. No pertinent surgical history.  Allergies: Enalapril  Medications: Prior to Admission medications   Medication Sig Start  Date End Date Taking? Authorizing Provider  albuterol (VENTOLIN HFA) 108 (90 Base) MCG/ACT inhaler Inhale 2 puffs into the lungs every 6 (six) hours as needed for wheezing or shortness of breath. 02/29/20   Tresa Moore, MD  amiodarone (PACERONE) 200 MG tablet Take 1 tablet (200 mg total) by mouth 2 (two) times daily. Patient not taking: No sig reported 03/10/20   Marrion Coy, MD  apixaban (ELIQUIS) 2.5 MG TABS tablet Take 1 tablet (2.5 mg total) by mouth 2 (two) times daily. 10/16/20   Schnier, Latina Craver, MD  aspirin EC 81 MG tablet Take 81 mg by mouth daily. Patient not taking: No sig reported    [provider]  clotrimazole (MYCELEX) 10 MG troche Take 10 mg by mouth 5 (five) times daily. Patient not taking: No sig reported 03/15/20   [provider]  guaiFENesin-dextromethorphan (ROBITUSSIN DM) 100-10 MG/5ML syrup Take 10 mLs by mouth every 4 (four) hours as needed for cough. Patient not taking: No sig reported 02/29/20   Lolita Patella B, MD  losartan (COZAAR) 100 MG tablet Take 100 mg by mouth daily. 09/16/20   [provider]  Melatonin 5 MG TBDP Take by mouth. Patient not taking: Reported on 10/13/2020    [provider]  simvastatin (ZOCOR) 20 MG tablet Take 20 mg by mouth at bedtime. 10/12/20   [provider]     History reviewed. No pertinent family history.  Social History   Socioeconomic History   Marital status: Married    Spouse name: Not on file  Number of children: Not on file   Years of education: Not on file   Highest education level: Not on file  Occupational History   Not on file  Tobacco Use   Smoking status: Never   Smokeless tobacco: Never  Substance and Sexual Activity   Alcohol use: Not Currently   Drug use: Not Currently   Sexual activity: Not on file  Other Topics Concern   Not on file  Social History Narrative   Not on file   Social Determinants of Health   Financial Resource Strain: Not on  file  Food Insecurity: Not on file  Transportation Needs: Not on file  Physical Activity: Not on file  Stress: Not on file  Social Connections: Not on file     Review of Systems: A 12 point ROS discussed and pertinent positives are indicated in the HPI above.  All other systems are negative.  Review of Systems  Constitutional:  Negative for fatigue and fever.  Respiratory:  Negative for cough and shortness of breath (continues to deny shortness of breath at present despite O2 need).   Cardiovascular:  Negative for chest pain.  Gastrointestinal:  Negative for abdominal pain, nausea and vomiting.  Musculoskeletal:  Negative for back pain.  Psychiatric/Behavioral:  Negative for behavioral problems and confusion.     Vital Signs: BP 113/75   Pulse 96   Temp (!) 97.5 F (36.4 C) (Temporal)   Resp (!) 25   Ht 6\' 2"  (1.88 m)   Wt 228 lb (103.4 kg)   SpO2 97%   BMI 29.27 kg/m   Physical Exam Vitals and nursing note reviewed.  Constitutional:      General: He is not in acute distress.    Appearance: Normal appearance. He is not ill-appearing.  HENT:     Mouth/Throat:     Mouth: Mucous membranes are moist.     Pharynx: Oropharynx is clear.  Cardiovascular:     Rate and Rhythm: Regular rhythm. Tachycardia present.  Pulmonary:     Effort: No respiratory distress.     Comments: Mildly increased WOB with tachypnea. Abdominal:     General: Abdomen is flat.     Palpations: Abdomen is soft.  Skin:    Comments: Large laceration lateral to the right eye with considerable swelling and bruising along the right cheekbone.  Right side of face with extensive scrapes.  Scattered bruising and scrapes to right arm and hand.   Neurological:     General: No focal deficit present.     Mental Status: He is alert and oriented to person, place, and time. Mental status is at baseline.  Psychiatric:        Mood and Affect: Mood normal.        Behavior: Behavior normal.        Thought Content:  Thought content normal.        Judgment: Judgment normal.      MD Evaluation Airway: WNL Heart: WNL Abdomen: WNL Chest/ Lungs: WNL ASA  Classification: 3 Mallampati/Airway Score: Two   Imaging: CT Angio Chest PE W and/or Wo Contrast  Result Date: 11/29/2021 CLINICAL DATA:  Syncopal episode with loss of consciousness and fall injury. EXAM: CT ANGIOGRAPHY CHEST WITH CONTRAST TECHNIQUE: Multidetector CT imaging of the chest was performed using the standard protocol during bolus administration of intravenous contrast. Multiplanar CT image reconstructions and MIPs were obtained to evaluate the vascular anatomy. RADIATION DOSE REDUCTION: This exam was performed according to the departmental dose-optimization program  which includes automated exposure control, adjustment of the mA and/or kV according to patient size and/or use of iterative reconstruction technique. CONTRAST:  OMNIPAQUE IOHEXOL 350 MG/ML SOLN COMPARISON:  Portable chest today, portable chest 03/07/2020, chest CT with contrast 12/01/2008. FINDINGS: Cardiovascular: There is mild cardiomegaly, no pericardial effusion and no visible coronary calcifications. There is elevated RV LV ratio of 1.49 and IVC reflux indicating right heart strain, prominent pulmonary trunk and main arteries with the pulmonary trunk 3.5 cm, and a large bilateral pulmonary arterial embolic burden. There is a band embolus partially filling the left main pulmonary artery, smaller band of embolus in the distal right main pulmonary artery. In the upper lobes there are multiple partially occlusive segmental and subsegmental emboli. In the right lower lobe there are nonocclusive segmental and scattered occlusive subsegmental emboli to the basal segments, segmental embolus to the right lower lobe superior segment, and multiple segmental and subsegmental occlusive emboli in the left lower lobe. There is a nonocclusive embolus in the right middle lobe interlobar artery.  There is aortic tortuosity and moderate patchy atherosclerosis, scattered plaques in the great vessels without stenosis or dissection. Dilatation of the aortic root is seen up to 4.5 cm at the sinuses of Valsalva. The rest is within normal caliber limits. Pulmonary veins are normal caliber. Mediastinum/Nodes: No enlarged mediastinal, hilar, or axillary lymph nodes. Thyroid gland, trachea, and esophagus demonstrate no significant findings. Lungs/Pleura: There are patchy peripheral ground-glass opacities in the left upper and both lower lobes, moderate posterior atelectasis in the lower lobes. There is no pleural effusion, thickening or pneumothorax. Mild bronchial thickening in the lower lobes. Upper Abdomen: No acute finding. Small stones layer in the gallbladder. Musculoskeletal: No regional skeletal fracture is seen. Review of the MIP images confirms the above findings. IMPRESSION: Bilateral acute arterial emboli with large clot burden and right heart strain findings. Positive for acute PE with CT evidence of right heart strain (RV/LV Ratio = 1.49) consistent with at least submassive (intermediate risk) PE. The presence of right heart strain has been associated with an increased risk of morbidity and mortality. Please refer to the "Code PE Focused" order set in EPIC. Aortic atherosclerosis with 4.5 cm dilatation of the aortic root. Annual CTA or MRA follow-up and vascular surgery consult recommended. Mild cardiomegaly. 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 Background bronchitis. Cholelithiasis. Results phoned to Dr. Renaye Rakers in the ED at 7:48 a.m., 11/29/2021 with verbal acknowledgement of key findings. Electronically Signed   By: Almira Bar M.D.   On: 11/29/2021 08:05   CT Cervical Spine Wo Contrast  Result Date: 11/29/2021 CLINICAL DATA:  Fall getting into truck.  Trauma to head. EXAM: CT CERVICAL SPINE WITHOUT  CONTRAST TECHNIQUE: Multidetector CT imaging of the cervical spine was performed without intravenous contrast. Multiplanar CT image reconstructions were also generated. RADIATION DOSE REDUCTION: This exam was performed according to the departmental dose-optimization program which includes automated exposure control, adjustment of the mA and/or kV according to patient size and/or use of iterative reconstruction technique. COMPARISON:  None Available. FINDINGS: Alignment: Slight retrolisthesis at C3-4 is degenerative. No traumatic listhesis is present. Straightening of the normal cervical lordosis is likely chronic. Skull base and vertebrae: Craniocervical junction is within normal limits. No acute fractures are present. Soft tissues and spinal canal: No prevertebral fluid or swelling. No visible canal hematoma. Disc levels: Moderate to severe left foraminal stenosis present at C3-4 and C4-5 due  to uncovertebral and facet spurring. Moderate bilateral foraminal narrowing scratched at severe right and moderate left foraminal narrowing is present at C5-6. Upper chest: The lung apices are clear. Thoracic inlet is within normal limits. IMPRESSION: 1. No acute fracture or traumatic listhesis. 2. Multilevel degenerative disc disease as described. These results were called by telephone at the time of interpretation on 11/29/2021 at 8:04 am to provider Dr. Langston Masker, who verbally acknowledged these results. Electronically Signed   By: San Morelle M.D.   On: 11/29/2021 08:05   CT Head Wo Contrast  Result Date: 11/29/2021 CLINICAL DATA:  Head trauma. [place] getting into his truck. Abrasion to left side of forehead and cheek. EXAM: CT HEAD WITHOUT CONTRAST TECHNIQUE: Contiguous axial images were obtained from the base of the skull through the vertex without intravenous contrast. RADIATION DOSE REDUCTION: This exam was performed according to the departmental dose-optimization program which includes automated exposure control,  adjustment of the mA and/or kV according to patient size and/or use of iterative reconstruction technique. COMPARISON:  None Available. FINDINGS: Brain: No acute infarct, hemorrhage, or mass lesion is present. Atrophy and white matter changes are mildly advanced for age. Basal ganglia are intact. No acute or focal cortical abnormalities are present. The ventricles are of normal size. No significant extraaxial fluid collection is present. 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 The splenium of the corpus callosum is incompletely formed. No other midline abnormalities are present. The brainstem and cerebellum are within normal limits. Vascular: No hyperdense vessel or unexpected calcification. Skull: A left supraorbital and frontal scalp laceration and hematoma is present. No underlying fracture or foreign body is present. Soft tissue swelling extends along the lateral left orbit and over the left maxilla. The zygomatic arch is intact. Sinuses/Orbits: The paranasal sinuses and mastoid air cells are clear. The globes and orbits are within normal limits. IMPRESSION: 1. Left supraorbital and frontal scalp laceration and hematoma without underlying fracture or foreign body. 2. Soft tissue swelling extends along the lateral left orbit and over the left maxilla. 3. No acute intracranial abnormality. 4. Atrophy and white matter changes are mildly advanced for age. This likely reflects the sequela of chronic microvascular ischemia. These results were called by telephone at the time of interpretation on 11/29/2021 at 8:01 am to provider Dr. Langston Masker, who verbally acknowledged these results. Electronically Signed   By: San Morelle M.D.   On: 11/29/2021 08:02   CT ABDOMEN PELVIS W CONTRAST  Result Date: 11/29/2021 CLINICAL DATA:  [place] getting into truck today.  Trauma to head. EXAM: CT ABDOMEN AND PELVIS WITH CONTRAST TECHNIQUE: Multidetector CT imaging of the abdomen and pelvis was performed using  the standard protocol following bolus administration of intravenous contrast. RADIATION DOSE REDUCTION: This exam was performed according to the departmental dose-optimization program which includes automated exposure control, adjustment of the mA and/or kV according to patient size and/or use of iterative reconstruction technique. CONTRAST:  132mL OMNIPAQUE IOHEXOL 350 MG/ML SOLN COMPARISON:  CT a of the chest 11/29/2021. CT abdomen pelvis 12/10/2008 FINDINGS: Lower chest: Pulmonary artery are noted at the scratched at pulmonary emboli are noted in the right main outflow tract and left lower lobar pulmonary arteries. Right heart strain is evident. Minimal scarring or atelectasis is present at both lung bases. No pleural effusion is present. Hepatobiliary: Small layering stones are present at the neck of the gallbladder. No inflammatory changes are present. Liver is unremarkable. No acute trauma. Pancreas: Unremarkable. No pancreatic ductal dilatation  or surrounding inflammatory changes. Spleen: No splenic injury or perisplenic hematoma. Adrenals/Urinary Tract: No adrenal hemorrhage or renal injury identified. Bladder is unremarkable. A 17 mm simple cyst is present posterior left kidney. Recommend no imaging follow-up. Stomach/Bowel: Stomach and duodenum are within normal limits. Small bowel is unremarkable. Terminal ileum is within normal limits. The appendix is visualized and normal. Diverticular present in the ascending colon near the hepatic flexure. Additional diverticular present through the transverse colon and particularly in the distal descending and sigmoid colon without inflammation to suggest diverticulitis. Vascular/Lymphatic: Atherosclerotic calcifications are present in the aorta and branch vessels. Maximal diameter is 2.4 cm. No aneurysm is present. No focal stenosis is evident. No significant adenopathy is present. Reproductive: Prostate is unremarkable.  Bilateral hydrocele noted. Other: Fat  herniates into the inguinal canals bilaterally. No other significant ventral hernia is present. No bowel herniates. Musculoskeletal: Vertebral body heights and alignment are normal. No acute fracture or traumatic subluxation is present. Degenerative changes are present in the lumbar spine with left greater than right stenosis at L4-5 and L5-S1. The bony pelvis is within normal limits. The hips are located and normal. IMPRESSION: 1. No acute trauma to the abdomen or pelvis. 2. Bilateral pulmonary emboli with right heart strain. 3. Cholelithiasis without evidence for cholecystitis. 4. Extensive colonic diverticulosis without diverticulitis. 5. Fat herniates into the inguinal canals bilaterally. No bowel herniates. 6. Degenerative changes in the lumbar spine with left greater than right stenosis at L4-5 and L5-S1. 7. Aortic Atherosclerosis (ICD10-I70.0). Critical Value/emergent results were called by telephone at the time of interpretation on 11/29/2021 at 7:58 am to provider Dr. Langston Masker, who verbally acknowledged these results. Electronically Signed   By: San Morelle M.D.   On: 11/29/2021 07:58   DG Chest Portable 1 View  Result Date: 11/29/2021 CLINICAL DATA:  Loss of consciousness, lightheadedness. EXAM: PORTABLE CHEST 1 VIEW COMPARISON:  Portable chest 03/07/2020. FINDINGS: There is mild cardiomegaly. No vascular congestion is seen. There is aortic calcification with stable mediastinal configuration. The lungs are clear of infiltrates. There is linear scarring right mid field along the short fissure. No pleural effusion is seen. There is thoracic spondylosis. The thoracic cage intact. There is overlying monitor wiring. IMPRESSION: No acute chest findings. Mild cardiomegaly. Aortic atherosclerosis. Compare: Interval resolution of prior bilateral peripheral infiltrates. Electronically Signed   By: Telford Nab M.D.   On: 11/29/2021 06:25    Labs:  CBC: Recent Labs    11/29/21 0625 11/29/21 0640   WBC 19.1*  --   HGB 14.1 13.9  HCT 41.5 41.0  PLT 158  --     COAGS: No results for input(s): "INR", "APTT" in the last 8760 hours.  BMP: Recent Labs    11/29/21 0625 11/29/21 0640 11/29/21 0929  NA 137 139 140  K 2.8* 3.0* 3.6  CL 103 100 102  CO2 24  --  28  GLUCOSE 212* 216* 178*  BUN 18 20 15   CALCIUM 8.2*  --  8.0*  CREATININE 1.35* 1.20 1.28*  GFRNONAA 57*  --  >60    LIVER FUNCTION TESTS: Recent Labs    11/29/21 0625  BILITOT 1.0  AST 21  ALT 15  ALKPHOS 84  PROT 6.1*  ALBUMIN 3.3*    TUMOR MARKERS: No results for input(s): "AFPTM", "CEA", "CA199", "CHROMGRNA" in the last 8760 hours.  Assessment and Plan: Pulmonary embolus Patient with previous history of DVT requiring anticoagulation in the past recently stopped taking Eliquis in April now presents with  acute submassive bilateral PE with evidence of right heart strain.  IR consulted for possible thrombectomy vs. Thrombolysis.  Patient is noted to have significant facial trauma though CT Head, Abdomen/Pelvis negative for acute injury or fracture.  Case reviewed by Dr. Earleen Newport who has discussed with the patient and his family.  Paul Mitchell elects to proceed with thrombectomy.  He is currently on heparin gtt.  He is currently stable on NRB and is able to lie flat.   Risks and benefits discussed with the patient including, but not limited to bleeding, possible life threatening bleeding and need for blood product transfusion, vascular injury, stroke, contrast induced renal failure, limb loss and infection.  All of the patient's questions were answered, patient is agreeable to proceed. Consent signed and in chart.   Thank you for this interesting consult.  I greatly enjoyed meeting Paul Mitchell and look forward to participating in their care.  A copy of this report was sent to the requesting provider on this date.  Electronically Signed: Docia Barrier, PA 11/29/2021, 10:24 AM   I spent a  total of 40 Minutes    in face to face in clinical consultation, greater than 50% of which was counseling/coordinating care for pulmonary embolus.

## 2021-11-29 NOTE — ED Triage Notes (Signed)
Patient got to work at [phone] and was getting in truck and remembers nothing else. Coworker reports he just had LOC and fell out of truck.  Large lac to left forehead and abrasion to cheek.  Initially hypotensive and hypoxic in the 80's.  EMS attempted [place] but ended up with NRB to maintain mid 90's Patient denies SOB.  Patient has had vomiting upon arrival.

## 2021-11-29 NOTE — H&P (Addendum)
NAME:  Paul Mitchell, MRN:  [id number], DOB:  May 05, 1950, LOS: 0 ADMISSION DATE:  11/29/2021, CONSULTATION DATE: 11/29/2021 REFERRING MD: Emergency department physician, CHIEF COMPLAINT: Acute hypoxia in the setting of bilateral saddle pulmonary embolisms  History of Present Illness:  Paul Mitchell is a 71 year old male never smoker nondrinker who has a history of COVID in 2021 and required oxygen for several months afterwards along with anticoagulation for a pulmonary embolism.  He is off oxygen and off treatment for pulmonary embolism with anticoagulation and on 11/29/2021 he has had a fall with facial trauma loss of consciousness and admission to the emergency department at [facility].  CT scans of head through chest and abdomen revealed bilateral large saddle pulmonary embolism with no acute injury to head neck or abdomen.  He is requiring 15 L nonrebreather may have sats greater than 92% therefore pulmonary critical care was called to evaluate for possible IR intervention.  He will go to IR for further evaluation and treatment he has been placed on heparin as this can measure until interventional radiology needed performed lytics or thrombectomy.  Pertinent  Medical History   Past Medical History:  Diagnosis Date   Hypertension   History of PE History of COVID requiring oxygen History of congestive heart failure EF of 40%   Significant Hospital Events: Including procedures, antibiotic start and stop dates in addition to other pertinent events   11/29/2021 syncope with facial trauma positive for bilateral PEs  Interim History / Subjective:  Syncopal episode resulting in facial trauma CT reveals bilateral large saddle pulmonary embolism  Objective   Blood pressure 100/71, pulse 100, temperature (!) 97.5 F (36.4 C), temperature source Temporal, resp. rate (!) 21, height 6\' 2"  (1.88 m), weight 103.4 kg, SpO2 99 %.        Intake/Output Summary (Last 24 hours) at 11/29/2021 0836 Last data  filed at 11/29/2021 0754 Gross per 24 hour  Intake 1000 ml  Output --  Net 1000 ml   Filed Weights   11/29/21 01/29/22  Weight: 103.4 kg    Examination: General: Well-nourished well-developed male in no acute distress HENT: Left-sided facial trauma was noted see picture below Lungs: Clear to auscultation Cardiovascular: Heart sounds are regular Abdomen: Soft nontender Extremities: 2+ lower extremity edema Neuro: No focal defects appreciated GU: Voids  Resolved Hospital Problem list     Assessment & Plan:  Acute hypoxic respiratory failure and loss of consciousness in the setting of massive bilateral saddle PE bedside echo revealing RV strain and RV septal flattening he is also requiring 15 L nonrebreather to maintain oxygen saturation.  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 We will have interventional radiology intervene Thrombolytics versus thrombectomy Anticoagulation post intervention He will need to be anticoagulated for life since this is a second event Admit to the ICU for at least 24 hours for observation GI protection Wean FiO2 as tolerated keep sats greater 92%  History of COVID 03/15/2021 that required oxygen for approximately 6 months. Monitor O2 saturations He is [facility] by [facility] his pulmonary  History of congestive heart failure with a EF of 40%.  History of paroxysmal atrial fibrillation Careful with fluid administration May need cardiology consult  Syncope resulting in facial trauma   Sutured per the emergency physician Head CT neck CT negative Analgesics as needed Daily wound cleaning  Hypokalemia Recent Labs  Lab 11/29/21 0625 11/29/21 0640  K 2.8* 3.0*   Replete  and monitor   Best Practice (right click and "Reselect all SmartList Selections" daily)   Diet/type: Regular consistency (see orders) DVT prophylaxis: systemic heparin GI prophylaxis: PPI Lines: N/A Foley:   N/A Code Status:  full code Last date of multidisciplinary goals of care discussion [tbd] Patient and family updated at bedside 11/29/2021 Labs   CBC: Recent Labs  Lab 11/29/21 0625 11/29/21 0640  WBC 19.1*  --   NEUTROABS 13.4*  --   HGB 14.1 13.9  HCT 41.5 41.0  MCV 88.1  --   PLT 158  --     Basic Metabolic Panel: Recent Labs  Lab 11/29/21 0625 11/29/21 0640  NA 137 139  K 2.8* 3.0*  CL 103 100  CO2 24  --   GLUCOSE 212* 216*  BUN 18 20  CREATININE 1.35* 1.20  CALCIUM 8.2*  --   MG 1.7  --    GFR: Estimated Creatinine Clearance: 75.6 mL/min (by C-G formula based on SCr of 1.2 mg/dL). Recent Labs  Lab 11/29/21 0625  WBC 19.1*    Liver Function Tests: Recent Labs  Lab 11/29/21 0625  AST 21  ALT 15  ALKPHOS 84  BILITOT 1.0  PROT 6.1*  ALBUMIN 3.3*   No results for input(s): "LIPASE", "AMYLASE" in the last 168 hours. No results for input(s): "AMMONIA" in the last 168 hours.  ABG    Component Value Date/Time   HCO3 26.5 11/29/2021 0625   TCO2 25 11/29/2021 0640   O2SAT 85.1 11/29/2021 0625     Coagulation Profile: No results for input(s): "INR", "PROTIME" in the last 168 hours.  Cardiac Enzymes: No results for input(s): "CKTOTAL", "CKMB", "CKMBINDEX", "TROPONINI" in the last 168 hours.  HbA1C: Hgb A1c MFr Bld  Date/Time Value Ref Range Status  03/03/2020 01:04 PM 6.4 (H) 4.8 - 5.6 % Final    Comment:    (NOTE) Pre diabetes:          5.7%-6.4%  Diabetes:              >6.4%  Glycemic control for   <7.0% adults with diabetes     CBG: Recent Labs  Lab 11/29/21 0606  GLUCAP 198*    Review of Systems:   10 point review of system taken, please see HPI for positives and negatives. Negative for chest pain He is unaware that he had passed out. No recent dyspnea on exertion. No oxygen requirement since March Off anticoagulation since March or April 23  Past Medical History:  He,  has a past medical history of Hypertension.    Surgical History:  History reviewed. No pertinent surgical history.   Social History:   reports that he has never smoked. He has never used smokeless tobacco. He reports that he does not currently use alcohol. He reports that he does not currently use drugs.   Family History:  His family history is not on file.   Allergies Allergies  Allergen Reactions   Enalapril Cough     Home Medications  Prior to Admission medications   Medication Sig Start Date End Date Taking? Authorizing Provider  albuterol (VENTOLIN HFA) 108 (90 Base) MCG/ACT inhaler Inhale 2 puffs into the lungs every 6 (six) hours as needed for wheezing or shortness of breath. 02/29/20   Sidney Ace, MD  amiodarone (PACERONE) 200 MG tablet Take 1 tablet (200 mg total) by mouth 2 (two) times daily. Patient not taking: No sig reported 03/10/20   Sharen Hones, MD  apixaban Arne [place])  2.5 MG TABS tablet Take 1 tablet (2.5 mg total) by mouth 2 (two) times daily. 10/16/20   Schnier, Latina Craver, MD  aspirin EC 81 MG tablet Take 81 mg by mouth daily. Patient not taking: No sig reported    [provider]  clotrimazole (MYCELEX) 10 MG troche Take 10 mg by mouth 5 (five) times daily. Patient not taking: No sig reported 03/15/20   [provider]  guaiFENesin-dextromethorphan (ROBITUSSIN DM) 100-10 MG/5ML syrup Take 10 mLs by mouth every 4 (four) hours as needed for cough. Patient not taking: No sig reported 02/29/20   Lolita Patella B, MD  losartan (COZAAR) 100 MG tablet Take 100 mg by mouth daily. 09/16/20   [provider]  Melatonin 5 MG TBDP Take by mouth. Patient not taking: Reported on 10/13/2020    [provider]  simvastatin (ZOCOR) 20 MG tablet Take 20 mg by mouth at bedtime. 10/12/20   [provider]     Critical care time: 45 min    Brett Canales Aliciana Ricciardi ACNP Acute Care Nurse Practitioner [facility] Pulmonary/Critical Care Please consult Amion 11/29/2021, 8:36  AM

## 2021-11-29 NOTE — Progress Notes (Addendum)
eLink Physician-Brief Progress Note Patient Name: Paul Mitchell DOB: 07-Apr-1950 MRN: [id number]   Date of Service  11/29/2021  HPI/Events of Note  Patient with enlarging right groin hematoma s/p IR cannulation of right common femoral vessel for treatment of PE.  eICU Interventions  Will ask the PCCM ground team to assess at bedside. Coags and type & screen ordered.        Thomasene Lot Brentley Landfair 11/29/2021, 9:14 PM

## 2021-11-29 NOTE — Sedation Documentation (Signed)
Pt transported to ICU with this RN and Alphonzo Lemmings, Charity fundraiser. Pt alert and talking. Pt transported on bed and on cardiac, bp and pulse ox monitor. On arrival to the ICU, right femoral site assessed: Clean, dry and intact, no bleeding or hematoma noted.

## 2021-11-29 NOTE — Sedation Documentation (Signed)
Pt reports chest pain and feeling warm. Cool rang on patients head. Repeat BP 71/61 and oxygen at 84% on NRB. Provider notified.

## 2021-11-29 NOTE — Sedation Documentation (Signed)
  PAP pre procedure 54/35 (42)

## 2021-11-29 NOTE — Telephone Encounter (Signed)
Pharmacy Patient Advocate Encounter  Insurance verification completed.    The patient is insured through [employer]   The patient is currently admitted and ran test claims for the following: Eliquis, Xarelto.  Copays and coinsurance results were relayed to Inpatient clinical team.      

## 2021-11-29 NOTE — Sedation Documentation (Signed)
PAP 42/30 (35)

## 2021-11-29 NOTE — ED Notes (Signed)
Transported patient to IR suite

## 2021-11-29 NOTE — ED Notes (Signed)
Attempted to take patient off oxygen and patient desatted with MD at bedside.

## 2021-11-29 NOTE — ED Notes (Signed)
IR MD at bedside

## 2021-11-29 NOTE — Procedures (Addendum)
Interventional Radiology Procedure Note  Procedure:   US guided right CFV access PA angiogram, pressure measurement Aspiration/mechanical thrombectomy of PE, bilateral.   Findings: Initial PA pressure: 54/35 (42) After left treatment: 42/30 (35) After left & right treatment = final: 34/21 (26) .  Complications: None EBL: ~300cc Recommendations:  - Right CFV access pressure dressing overnight.  VIR will remove in am. - Right hip straight x 6 hours - continue heparin ggt.  Final ACT in room was 268 - Do not submerge for 7 days - Routine wound care  - VIR ok with advancing diet per primary order  Signed,  Yvone Neu. Loreta Ave, DO

## 2021-11-29 NOTE — Progress Notes (Signed)
Right groin site reassessed at 2100 and bruising to area had increased by 2cm. No bleeding from site and patient hemodynamically stable. Elink notified of change, PCCM ground team came to bedside and site was assessed no acute findings. Site redressed by PCCM ground team and pressure applied by this RN. Will continue to monitor for any changes in groin site or hemodynamics.

## 2021-11-29 NOTE — Plan of Care (Signed)
  Problem: Education: Goal: Knowledge of General Education information will improve Description: Including pain rating scale, medication(s)/side effects and non-pharmacologic comfort measures Outcome: Progressing   Problem: Health Behavior/Discharge Planning: Goal: Ability to manage health-related needs will improve Outcome: Progressing   Problem: Clinical Measurements: Goal: Respiratory complications will improve Outcome: Progressing   

## 2021-11-29 NOTE — Sedation Documentation (Signed)
Pt states feeling better, just some warmth and a sore bottom. Provider aware pt is on 5lpm via [place]. No new orders at this time. Pt still on full monitor.

## 2021-11-29 NOTE — Sedation Documentation (Addendum)
Oxygen decreased from 5lpm via [place] to 3lpm via [place]. Provider aware. Plan is to leave pt on 3lpm of oxygen via [place] at this time and not titrate supplemental oxygen further down at this time.

## 2021-11-29 NOTE — Progress Notes (Signed)
ANTICOAGULATION CONSULT NOTE - Follow Up Consult  Pharmacy Consult for heparin Indication: pulmonary embolus  Labs: Recent Labs    11/29/21 0625 11/29/21 0640 11/29/21 0838 11/29/21 0929 11/29/21 1510 11/29/21 2135  HGB 14.1 13.9  --   --   --   --   HCT 41.5 41.0  --   --   --   --   PLT 158  --   --   --   --   --   APTT  --   --   --   --   --  129*  LABPROT  --   --   --   --   --  17.3*  INR  --   --   --   --   --  1.4*  HEPARINUNFRC  --   --   --   --  >1.10*  --   HEPRLOWMOCWT  --   --   --   --   --  0.45  CREATININE 1.35* 1.20  --  1.28*  --   --   CKTOTAL 241  --   --   --   --   --   TROPONINIHS 140*  --  984*  --   --   --     Assessment/Plan:  71yo male therapeutic on heparin after rat change. Will continue infusion at current rate of 1400 units/hr and confirm stable with am labs.   Vernard Gambles, PharmD, BCPS  11/29/2021,11:29 PM

## 2021-11-29 NOTE — Progress Notes (Signed)
ANTICOAGULATION CONSULT NOTE - Initial Consult  Pharmacy Consult for heparin  Indication: pulmonary embolus  Allergies  Allergen Reactions   Enalapril Cough    Patient Measurements: Height: 6\' 2"  (188 cm) Weight: 103.4 kg (228 lb) IBW/kg (Calculated) : 82.2 Heparin Dosing Weight: 103kg   Vital Signs: Temp: 97.5 F (36.4 C) (09/06 0637) Temp Source: Temporal (09/06 0637) BP: 99/73 (09/06 0730) Pulse Rate: 96 (09/06 0730)  Labs: Recent Labs    11/29/21 0625 11/29/21 0640  HGB 14.1 13.9  HCT 41.5 41.0  PLT 158  --   CREATININE  --  1.20    Estimated Creatinine Clearance: 75.6 mL/min (by C-G formula based on SCr of 1.2 mg/dL).   Medical History: Past Medical History:  Diagnosis Date   Hypertension    Assessment: Patient is a 71 y.o M admitted for a chief complaint of syncope. CBC and renal function are stable, no bleeding is noted. CTA + for acute PE with evidence of heart strain consistent with submassive PE.   Patient on no previous anticoagulation, and has received none while admitted.   Goal of Therapy:  Heparin level 0.3-0.7 units/ml Monitor platelets by anticoagulation protocol: Yes   Plan:  Give 4000 units bolus x 1 Start heparin infusion at 1600 units/hr Check anti-Xa level in 6 hours and daily while on heparin Continue to monitor H&H and platelets  73 11/29/2021,8:08 AM

## 2021-11-29 NOTE — ED Notes (Signed)
ED Provider at bedside. 

## 2021-11-29 NOTE — Sedation Documentation (Signed)
IR provider notified of BP 73/57 and pulse ox reading of 92% on 15LPM via NRB. Order received for NS bolus

## 2021-11-29 NOTE — Sedation Documentation (Signed)
PAP 34/21 (26)

## 2021-11-29 NOTE — ED Notes (Signed)
Was told by Dr Merrily Pew patient is to go to IR first before going to floor.  IR is unsure when they will get to his case so Chand MD messaged about whether patient can go to floor prior to IR awaiting response.

## 2021-11-29 NOTE — Plan of Care (Signed)

## 2021-11-29 NOTE — ED Provider Notes (Signed)
71 yo male here with shortness of breath, syncope  CT PE concerning for large central PE - patient is on 15 L NRB  Plan to start heparin, consult critical care   Clinical Course as of 11/29/21 0902  Wed Nov 29, 2021  0759 I spoke to Carlyon Shadow from critical care - consult placed for massive PE w/ RH strain, requiring 15 L NRB.  On my assessment the patient is otherwise mentating well, he is not tachypneic, and on the nonrebreather mask he is maintaining his oxygen saturations appropriately, not requiring intubation.  His family members are at the bedside and they have been updated as well as the patient.  The radiologist reports no acute intracranial or cervical spine findings.  C-spine collar cleared. [MT]  [phone] Admitted to ICU team who is at the bedside, and who may plan for targeted lytics, but ICU attending advises it's okay to give heparin bolus and infusion now. [MT]    Clinical Course User Index [MT] Terald Sleeper, MD   Forehead laceration irrigated and repaired with sutures at bedside.   Marland Kitchen.Laceration Repair  Date/Time: 11/29/2021 9:02 AM  Performed by: Terald Sleeper, MD Authorized by: Terald Sleeper, MD   Consent:    Consent obtained:  Verbal   Consent given by:  Patient   Risks discussed:  Pain, poor cosmetic result, poor wound healing and infection Universal protocol:    Procedure explained and questions answered to patient or proxy's satisfaction: yes     Immediately prior to procedure, a time out was called: yes     Patient identity confirmed:  Arm band Anesthesia:    Anesthesia method:  Local infiltration   Local anesthetic:  Lidocaine 1% WITH epi Laceration details:    Location:  Face   Face location:  Forehead   Length (cm):  3 Pre-procedure details:    Preparation:  Patient was prepped and draped in usual sterile fashion Exploration:    Imaging obtained: x-ray     Imaging outcome: foreign body not noted     Wound exploration: wound explored  through full range of motion and entire depth of wound visualized     Contaminated: no   Treatment:    Area cleansed with:  Saline   Amount of cleaning:  Standard Skin repair:    Repair method:  Sutures   Suture size:  4-0   Suture material:  Prolene   Suture technique:  Simple interrupted   Number of sutures:  5 Approximation:    Approximation:  Close Repair type:    Repair type:  Simple Post-procedure details:    Dressing:  Open (no dressing)   Procedure completion:  Tolerated well, no immediate complications .Critical Care  Performed by: Terald Sleeper, MD Authorized by: Terald Sleeper, MD   Critical care provider statement:    Critical care time (minutes):  35   Critical care time was exclusive of:  Separately billable procedures and treating other patients   Critical care was necessary to treat or prevent imminent or life-threatening deterioration of the following conditions:  Respiratory failure   Critical care was time spent personally by me on the following activities:  Ordering and performing treatments and interventions, ordering and review of laboratory studies, ordering and review of radiographic studies, pulse oximetry, review of old charts, examination of patient and evaluation of patient's response to treatment Comments:     High flow oxygen for hypoxic respiratory failure, IV heparin for massive pulmonary embolism.  ICU consultation     Terald Sleeper, MD 11/29/21 [phone]

## 2021-11-29 NOTE — Sedation Documentation (Addendum)
Provider notified of oxygen at 84% on a NRB at 15L and BP of 65/48. Pt also states feeling hot. Order received for a of NS bolus ordered. Cool, wet rag placed on patients forehead.

## 2021-11-29 NOTE — ED Provider Notes (Signed)
[facility] EMERGENCY DEPARTMENT Provider Note   CSN: [id number] Arrival date & time: 11/29/21  0557     History  Chief Complaint  Patient presents with   Loss of Consciousness    Paul Mitchell is a 71 y.o. male.  Patient as above with significant medical history as below, including HTN, PE, PAF, CKD, DVT, HLD, HTN, IDA who presents to the ED with complaint of syncope.    Pt was at work, ambulating /reaching for a door and collapsed. Felt mild light headed sensation prior to the fall. No nausea, no cp or dib prior to the fall. No longer on eliquis (VTE). Was not confused following the fall and was able to ambulate after the incident. Did strike his head and neck during the fall. Unsure of last tetanus. No incontinence reported. No numbness or tingling. On arrival he was hypoxic, desat to 78% on RA, did require 15L NRB on arrival to maintain sat >95%. Denies hx COPD or tobacco use. No hx asthma. He vomited x1 on arrival, mild abd cramping but has improved following emesis. No numbness or tingling at time of assessment.  No vision changes at this time, no diplopia. Unsure of last tetanus.  He has neck pain, no difficulty moving extremities. Denies similar episode in the past.   Past Medical History:  Diagnosis Date   Hypertension     History reviewed. No pertinent surgical history.    Loss of Consciousness Associated symptoms: headaches, nausea, shortness of breath and vomiting   Associated symptoms: no chest pain, no confusion, no fever and no palpitations        Home Medications Prior to Admission medications   Medication Sig Start Date End Date Taking? Authorizing Provider  albuterol (VENTOLIN HFA) 108 (90 Base) MCG/ACT inhaler Inhale 2 puffs into the lungs every 6 (six) hours as needed for wheezing or shortness of breath. 02/29/20   Sidney Ace, MD  amiodarone (PACERONE) 200 MG tablet Take 1 tablet (200 mg total) by mouth 2 (two) times  daily. Patient not taking: No sig reported 03/10/20   Sharen Hones, MD  apixaban (ELIQUIS) 2.5 MG TABS tablet Take 1 tablet (2.5 mg total) by mouth 2 (two) times daily. 10/16/20   Schnier, Dolores Lory, MD  aspirin EC 81 MG tablet Take 81 mg by mouth daily. Patient not taking: No sig reported    [provider]  clotrimazole (MYCELEX) 10 MG troche Take 10 mg by mouth 5 (five) times daily. Patient not taking: No sig reported 03/15/20   [provider]  guaiFENesin-dextromethorphan (ROBITUSSIN DM) 100-10 MG/5ML syrup Take 10 mLs by mouth every 4 (four) hours as needed for cough. Patient not taking: No sig reported 02/29/20   Ralene Muskrat B, MD  losartan (COZAAR) 100 MG tablet Take 100 mg by mouth daily. 09/16/20   [provider]  Melatonin 5 MG TBDP Take by mouth. Patient not taking: Reported on 10/13/2020    [provider]  simvastatin (ZOCOR) 20 MG tablet Take 20 mg by mouth at bedtime. 10/12/20   [provider]      Allergies    Enalapril    Review of Systems   Review of Systems  Constitutional:  Negative for chills and fever.  HENT:  Negative for facial swelling and trouble swallowing.   Eyes:  Negative for photophobia and visual disturbance.  Respiratory:  Positive for shortness of breath. Negative for cough.   Cardiovascular:  Positive for syncope. Negative for  chest pain and palpitations.  Gastrointestinal:  Positive for abdominal pain, nausea and vomiting.  Endocrine: Negative for polydipsia and polyuria.  Genitourinary:  Negative for difficulty urinating and hematuria.  Musculoskeletal:  Negative for gait problem and joint swelling.  Skin:  Positive for wound. Negative for pallor and rash.  Neurological:  Positive for syncope and headaches.  Psychiatric/Behavioral:  Negative for agitation and confusion.     Physical Exam Updated Vital Signs BP 108/71   Pulse (!) 102   Temp (!) 97.5 F (36.4 C) (Temporal)   Resp 20   Ht 6\' 2"   (1.88 m)   Wt 103.4 kg   SpO2 98%   BMI 29.27 kg/m  Physical Exam Vitals and nursing note reviewed.  Constitutional:      General: He is not in acute distress.    Appearance: He is well-developed. He is ill-appearing.  HENT:     Head: Normocephalic.     Comments: Laceration to left temporal, bleeding controlled    Right Ear: External ear normal.     Left Ear: External ear normal.     Mouth/Throat:     Mouth: Mucous membranes are moist.  Eyes:     General: No scleral icterus.    Extraocular Movements: Extraocular movements intact.     Pupils: Pupils are equal, round, and reactive to light.  Neck:     Trachea: Trachea normal. No tracheal deviation.     Comments: C-collar Midline pain on palpation to c-spine  Cardiovascular:     Rate and Rhythm: Normal rate and regular rhythm.     Pulses: Normal pulses.          Radial pulses are 2+ on the right side and 2+ on the left side.       Dorsalis pedis pulses are 2+ on the right side and 2+ on the left side.     Heart sounds: Normal heart sounds.  Pulmonary:     Effort: Tachypnea, accessory muscle usage and respiratory distress present.     Breath sounds: Normal breath sounds. No stridor.     Comments: Adventitious breath sounds b/l Abdominal:     General: Abdomen is flat.     Palpations: Abdomen is soft.     Tenderness: There is abdominal tenderness.  Musculoskeletal:        General: Normal range of motion.     Cervical back: Normal range of motion.     Right lower leg: 1+ Pitting Edema present.     Left lower leg: 1+ Pitting Edema present.     Comments: Pelvis stable to ap pressure No pain w/ log roll b/l LE   Skin:    General: Skin is warm and dry.     Capillary Refill: Capillary refill takes less than 2 seconds.  Neurological:     Mental Status: He is alert and oriented to person, place, and time.     GCS: GCS eye subscore is 4. GCS verbal subscore is 5. GCS motor subscore is 6.     Cranial Nerves: Cranial nerves 2-12  are intact. No dysarthria or facial asymmetry.     Sensory: Sensation is intact.     Motor: Motor function is intact. No tremor.     Coordination: Coordination is intact. Coordination normal.     Comments: Strength 5/5 BLUE Strength 5/5 BLLE   Psychiatric:        Mood and Affect: Mood normal.        Behavior: Behavior normal.  ED Results / Procedures / Treatments   Labs (all labs ordered are listed, but only abnormal results are displayed) Labs Reviewed  CBC WITH DIFFERENTIAL/PLATELET - Abnormal; Notable for the following components:      Result Value   WBC 19.1 (*)    Neutro Abs 13.4 (*)    Monocytes Absolute 1.7 (*)    Abs Immature Granulocytes 0.10 (*)    All other components within normal limits  BLOOD GAS, VENOUS - Abnormal; Notable for the following components:   pH, Ven 7.44 (*)    pCO2, Ven 39 (*)    pO2, Ven 51 (*)    Acid-Base Excess 2.3 (*)    All other components within normal limits  CBG MONITORING, ED - Abnormal; Notable for the following components:   Glucose-Capillary 198 (*)    All other components within normal limits  I-STAT CHEM 8, ED - Abnormal; Notable for the following components:   Potassium 3.0 (*)    Glucose, Bld 216 (*)    Calcium, Ion 1.04 (*)    All other components within normal limits  RESP PANEL BY RT-PCR (FLU A&B, COVID) [id number]  COMPREHENSIVE METABOLIC PANEL  URINALYSIS, ROUTINE W REFLEX MICROSCOPIC  MAGNESIUM  BRAIN NATRIURETIC PEPTIDE  CK  CBG MONITORING, ED  TROPONIN I (HIGH SENSITIVITY)  TROPONIN I (HIGH SENSITIVITY)    EKG EKG Interpretation  Date/Time:  Wednesday November 29 2021 06:10:42 EDT Ventricular Rate:  99 PR Interval:  166 QRS Duration: 110 QT Interval:  371 QTC Calculation: 477 R Axis:   8 Text Interpretation: Sinus rhythm Borderline prolonged QT interval similar to prior no stemi Confirmed by Wynona Dove ([id number]) on 11/29/2021 6:28:19 AM  Radiology DG Chest Portable 1 View  Result Date:  11/29/2021 CLINICAL DATA:  Loss of consciousness, lightheadedness. EXAM: PORTABLE CHEST 1 VIEW COMPARISON:  Portable chest 03/07/2020. FINDINGS: There is mild cardiomegaly. No vascular congestion is seen. There is aortic calcification with stable mediastinal configuration. The lungs are clear of infiltrates. There is linear scarring right mid field along the short fissure. No pleural effusion is seen. There is thoracic spondylosis. The thoracic cage intact. There is overlying monitor wiring. IMPRESSION: No acute chest findings. Mild cardiomegaly. Aortic atherosclerosis. Compare: Interval resolution of prior bilateral peripheral infiltrates. Electronically Signed   By: Telford Nab M.D.   On: 11/29/2021 06:25    Procedures .Critical Care  Performed by: Jeanell Sparrow, DO Authorized by: Jeanell Sparrow, DO   Critical care provider statement:    Critical care time (minutes):  45   Critical care time was exclusive of:  Separately billable procedures and treating other patients   Critical care was necessary to treat or prevent imminent or life-threatening deterioration of the following conditions:  Respiratory failure   Critical care was time spent personally by me on the following activities:  Development of treatment plan with patient or surrogate, discussions with consultants, evaluation of patient's response to treatment, examination of patient, ordering and review of laboratory studies, ordering and review of radiographic studies, ordering and performing treatments and interventions, pulse oximetry, re-evaluation of patient's condition, review of old charts and obtaining history from patient or surrogate     Medications Ordered in ED Medications  sodium chloride 0.9 % bolus 1,000 mL (1,000 mLs Intravenous New Bag/Given 11/29/21 0642)  ondansetron (ZOFRAN) injection 4 mg (4 mg Intravenous Given 11/29/21 0644)  Tdap (BOOSTRIX) injection 0.5 mL (0.5 mLs Intramuscular Given 11/29/21 0643)  iohexol  (OMNIPAQUE) 350 MG/ML injection 100 mL (  100 mLs Intravenous Contrast Given 11/29/21 0734)    ED Course/ Medical Decision Making/ A&P                           Medical Decision Making Amount and/or Complexity of Data Reviewed Labs: ordered. Radiology: ordered. ECG/medicine tests: ordered.  Risk Prescription drug management.   This patient presents to the ED with chief complaint(s) of fall, syncope with pertinent past medical history of above which further complicates the presenting complaint. The complaint involves an extensive differential diagnosis and also carries with it a high risk of complications and morbidity.    Differential diagnoses for head trauma includes subdural hematoma, epidural hematoma, acute concussion, traumatic subarachnoid hemorrhage, cerebral contusions, etc.  In my evaluation of this patient's dyspnea my DDx includes, but is not limited to, pneumonia, pulmonary embolism, pneumothorax, pulmonary edema, metabolic acidosis, asthma, COPD, cardiac cause, anemia, anxiety, etc.   Serious etiologies were considered.   The initial plan is to screening labs, imaging, continue NRB for now, IVF, zofran, boostrix   Additional history obtained: Additional history obtained from  na Records reviewed Care Everywhere/External Records and home meds (off eliquis)  Independent labs interpretation:  The following labs were independently interpreted:  WBC 19, afebrile; favor this is likely stress response in setting of syncope/saddle PE VBG pH 7.44, CO2 39 Remainder of labs pending.   Independent visualization of imaging: - I independently visualized the following imaging with scope of interpretation limited to determining acute life threatening conditions related to emergency care: CTH, CT C-spine, CTPE, CTAP, which revealed CTPE with large central PE. Wet read of CTH is w/o acute hemorrhage or space occupying lesion, pending official read from radiology.   Cardiac monitoring  was reviewed and interpreted by myself which shows sinus tachy, ECG w/o acute ischemia; no stemi.  Treatment and Reassessment: IVF Zofran Boostrix >> improved  Consultation: - Consulted or discussed management/test interpretation w/ external professional: n/a  Consideration for admission or further workup: Admission was considered    Pt here today w/ syncope. He has hx of PE, no longer on DOAC. He is tachycardic, hypoxic, no sig chest pain on exam. CT concerning for saddle PE. Plan to start anticoagulation once [facility] has returned and admit, likely to PCCM. Signed out to incoming EDP pending remainder of imaging/labs and eventual admission. Remains on 15L NRB, no baseline O2 requirement.   Social Determinants of health: Social History   Tobacco Use   Smoking status: Never   Smokeless tobacco: Never  Substance Use Topics   Alcohol use: Not Currently   Drug use: Not Currently            Final Clinical Impression(s) / ED Diagnoses Final diagnoses:  Acute saddle pulmonary embolism, unspecified whether acute cor pulmonale present (HCC)  Acute respiratory failure with hypoxia (HCC)  Syncope, unspecified syncope type    Rx / DC Orders ED Discharge Orders     None         Sloan Leiter, DO 11/29/21 0745

## 2021-11-30 ENCOUNTER — Inpatient Hospital Stay (HOSPITAL_COMMUNITY): Payer: Managed Care, Other (non HMO)

## 2021-11-30 DIAGNOSIS — I2609 Other pulmonary embolism with acute cor pulmonale: Secondary | ICD-10-CM | POA: Diagnosis not present

## 2021-11-30 LAB — CBC
HCT: 33.6 % — ABNORMAL LOW (ref 39.0–52.0)
Hemoglobin: 11.4 g/dL — ABNORMAL LOW (ref 13.0–17.0)
MCH: 30.3 pg (ref 26.0–34.0)
MCHC: 33.9 g/dL (ref 30.0–36.0)
MCV: 89.4 fL (ref 80.0–100.0)
Platelets: 137 10*3/uL — ABNORMAL LOW (ref 150–400)
RBC: 3.76 MIL/uL — ABNORMAL LOW (ref 4.22–5.81)
RDW: 13.8 % (ref 11.5–15.5)
WBC: 12 10*3/uL — ABNORMAL HIGH (ref 4.0–10.5)
nRBC: 0 % (ref 0.0–0.2)

## 2021-11-30 LAB — COMPREHENSIVE METABOLIC PANEL
ALT: 16 U/L (ref 0–44)
AST: 25 U/L (ref 15–41)
Albumin: 2.6 g/dL — ABNORMAL LOW (ref 3.5–5.0)
Alkaline Phosphatase: 60 U/L (ref 38–126)
Anion gap: 8 (ref 5–15)
BUN: 20 mg/dL (ref 8–23)
CO2: 24 mmol/L (ref 22–32)
Calcium: 7.8 mg/dL — ABNORMAL LOW (ref 8.9–10.3)
Chloride: 105 mmol/L (ref 98–111)
Creatinine, Ser: 1.28 mg/dL — ABNORMAL HIGH (ref 0.61–1.24)
GFR, Estimated: >60 mL/min (ref >60)
Glucose, Bld: 114 mg/dL — ABNORMAL HIGH (ref 70–99)
Potassium: 3.4 mmol/L — ABNORMAL LOW (ref 3.5–5.1)
Sodium: 137 mmol/L (ref 135–145)
Total Bilirubin: 0.7 mg/dL (ref 0.3–1.2)
Total Protein: 5 g/dL — ABNORMAL LOW (ref 6.5–8.1)

## 2021-11-30 LAB — HEPARIN LEVEL (UNFRACTIONATED)
Heparin Unfractionated: 0.28 IU/mL — ABNORMAL LOW (ref 0.30–0.70)
Heparin Unfractionated: 0.1 IU/mL — ABNORMAL LOW (ref 0.30–0.70)

## 2021-11-30 LAB — ECHOCARDIOGRAM COMPLETE
AR max vel: 5.07 cm2
AV Area VTI: 4.57 cm2
AV Area mean vel: 4.65 cm2
AV Mean grad: 4 mmHg
AV Peak grad: 7 mmHg
Ao pk vel: 1.32 m/s
Area-P 1/2: 4.21 cm2
Height: 74 in
S' Lateral: 2.7 cm
Weight: 3661.4 oz

## 2021-11-30 LAB — MAGNESIUM: Magnesium: 2.1 mg/dL (ref 1.7–2.4)

## 2021-11-30 MED ORDER — POTASSIUM CHLORIDE CRYS ER 20 MEQ PO TBCR
40.0000 meq | EXTENDED_RELEASE_TABLET | Freq: Once | ORAL | Status: AC
  Administered 2021-11-30: 40 meq via ORAL
  Filled 2021-11-30: qty 2

## 2021-11-30 MED ORDER — POTASSIUM CHLORIDE 10 MEQ/100ML IV SOLN: 10.0000 meq | INTRAVENOUS | Status: DC

## 2021-11-30 MED ORDER — HEPARIN BOLUS VIA INFUSION
1500.0000 [IU] | Freq: Once | INTRAVENOUS | Status: AC
  Administered 2021-11-30: 1500 [IU] via INTRAVENOUS
  Filled 2021-11-30: qty 1500

## 2021-11-30 MED ORDER — POTASSIUM CHLORIDE CRYS ER 20 MEQ PO TBCR
40.0000 meq | EXTENDED_RELEASE_TABLET | Freq: Two times a day (BID) | ORAL | Status: AC
Start: 2021-11-30 — End: 2021-11-30
  Administered 2021-11-30 (×2): 40 meq via ORAL
  Filled 2021-11-30 (×2): qty 2

## 2021-11-30 NOTE — Progress Notes (Addendum)
Referring Physician(s): Dr. Jacky Kindle  Supervising Physician: Corrie Mckusick  Patient Status:  [facility] - In-pt  Chief Complaint:  S/p PE thrombectomy on 9/6  Patient developed enlarging right groin hematoma last night, bedside US showed diffuse hematoma, no large vessel-adjacent pockets were appreciated. New dressing and pressure applied with saline bag.   Subjective:  Patient sitting in bed,NAD, spouse and daughter at bedside.  Reports that he is doing great, breathing is slightly better. No chest pain. No complains regarding right CFV puncture site.    Allergies: Enalapril  Medications: Prior to Admission medications   Medication Sig Start Date End Date Taking? Authorizing Provider  albuterol (VENTOLIN HFA) 108 (90 Base) MCG/ACT inhaler Inhale 2 puffs into the lungs every 6 (six) hours as needed for wheezing or shortness of breath. 02/29/20  Yes Sreenath, Sudheer B, MD  aspirin EC 81 MG tablet Take 81 mg by mouth daily.   Yes [provider]  furosemide (LASIX) 20 MG tablet Take 20 mg by mouth daily. 10/23/21  Yes [provider]  hydrochlorothiazide (MICROZIDE) 12.5 MG capsule Take 12.5 mg by mouth daily. 10/23/21  Yes [provider]  amiodarone (PACERONE) 200 MG tablet Take 1 tablet (200 mg total) by mouth 2 (two) times daily. Patient not taking: Reported on 10/13/2020 03/10/20   Sharen Hones, MD  apixaban (ELIQUIS) 2.5 MG TABS tablet Take 1 tablet (2.5 mg total) by mouth 2 (two) times daily. Patient not taking: Reported on 11/29/2021 10/16/20   Schnier, Dolores Lory, MD  guaiFENesin-dextromethorphan (ROBITUSSIN DM) 100-10 MG/5ML syrup Take 10 mLs by mouth every 4 (four) hours as needed for cough. Patient not taking: Reported on 10/13/2020 02/29/20   Sidney Ace, MD     Vital Signs: BP 106/61   Pulse 88   Temp 97.6 F (36.4 C) (Oral)   Resp 12   Ht 6\' 2"  (1.88 m)   Wt 228 lb 13.4 oz (103.8 kg)   SpO2 91%   BMI 29.38 kg/m   Physical  Exam Vitals reviewed.  Constitutional:      General: He is not in acute distress.    Appearance: He is not ill-appearing.  Cardiovascular:     Rate and Rhythm: Normal rate and regular rhythm.  Pulmonary:     Effort: Pulmonary effort is normal.  Abdominal:     General: Abdomen is flat.  Skin:    General: Skin is warm and dry.     Comments: Positive dressing and suture on R CFV puncture site. Site with ecchymosis at 10 and 4 o'clock, ecchymosis border was marked and it is not enlarging. Otherwise unremarkable with no erythema, edema, tenderness, active bleeding or drainage. Dressing clean, dry, and intact.    Neurological:     Mental Status: He is alert and oriented to person, place, and time.  Psychiatric:        Mood and Affect: Mood normal.        Behavior: Behavior normal.     Imaging: IR US Guide Vasc Access Right  Result Date: 11/29/2021 INDICATION: 71 year old male with submassive high-risk pulmonary embolism, presents for treatment EXAM: ULTRASOUND-GUIDED ACCESS RIGHT COMMON FEMORAL VEIN PULMONARY ANGIOGRAM MECHANICAL/ASPIRATION THROMBECTOMY OF BILATERAL PULMONARY ARTERIES PULMONARY ARTERY PRESSURE MEASUREMENTS COMPARISON:  None MEDICATIONS: Eight thousand IV units heparin 500 cc normal saline bolus ANESTHESIA/SEDATION: Moderate (conscious) sedation was employed during this procedure. A total of Versed 1.0 mg and Fentanyl 25 mcg was administered intravenously by the radiology nurse. Total intra-service moderate Sedation Time:  106 minutes. The patient's level of consciousness and vital signs were monitored continuously by radiology nursing throughout the procedure under my direct supervision. FLUOROSCOPY: Radiation Exposure Index (as provided by the fluoroscopic device): 21 minutes, 18 seconds, 219 mGy Kerma COMPLICATIONS: None TECHNIQUE: Informed consent was obtained from the patient and the patient's family following explanation of the procedure, risks, benefits and alternatives.  Specific risks include bleeding, infection, contrast reaction, kidney injury, venous injury, life-threatening hemorrhage, need for further surgery, need for further procedure, cardiopulmonary collapse, death. The patient understands, agrees and consents for the procedure. All questions were addressed. Patient is positioned supine position on the fluoroscopy table. Maximal barrier sterile technique utilized including caps, mask, sterile gowns, sterile gloves, large sterile drape, hand hygiene, and chlorhexidine prep. 1% lidocaine used for local anesthesia. Moderate sedation was provided. Ultrasound survey of the right inguinal region was performed with images stored and sent to PACs, confirming patency of the right common femoral vein. A single wall needle was used access the right common femoral vein under ultrasound. With excellent blood flow returned, 035 wire was passed through the needle, observed to enter the IVC under fluoroscopy. The needle was removed, and a standard 6 [place] vascular sheath was placed. The dilator was removed and the sheath was flushed. Combination of a angled pigtail catheter and a Bentson wire was then used to navigate to the right heart. Angled catheter used to select the main pulmonary artery. Wire was removed and angiogram was performed. Pressure measurement was obtained. Stiff Glidewire was then advanced through the pigtail catheter, reducing the curve and flipping into the left-sided pulmonary artery. The wire was straightened into the downgoing branches of the right pulmonary artery. Pigtail catheter was then removed. The 6 French sheath was removed. Sixteen French dilation was performed and then an 38 [place] Gore dry seal sheath was placed. Over the wire, a coaxial 125 cm H1 diagnostic catheter with 16 French H torque aspiration catheter were advanced into the downgoing branches of the left pulmonary arteries. The diagnostic catheter was removed, and aspiration was initiated with  the 16 [place] device. After the initial aspiration attempt, there was some hemodynamic instability, with oxygen saturation dropping into the low 80s, systolic blood pressure dropping into the mid 60s, and the aspiration catheter became occluded. Stepwise removal of the aspiration catheter over the Glidewire was performed, ultimately with removal completely from the system as well as removal of the Gore dry seal sheath from the venous access to clear the catheter of retained thrombus. Once the catheter and the sheath were cleared of residual thrombus/blood products the dry seal sheath was advanced on the Glidewire to the level of the right atrium. Introducer was removed on the wire. The angled pigtail catheter was then used to select the main pulmonary artery. Stiff Glidewire was a again advanced through the pigtail catheter, selecting the left-sided branches. Pigtail catheter was removed. Placement of the coaxial H torque 16 French device and the 125 cm catheter was again into the downgoing branches on the left. Once the aspiration catheter was in position the coaxial catheter was removed from the wire. Aspiration was then performed of the left-sided pulmonary artery and the segmental arteries into the downgoing branches. Catheter was removed from the system for clearance. Once the sheath was clear to of all residual thrombus the pigtail catheter was advanced again through the right heart into the main pulmonary artery. A repeat pulmonary artery pressure was performed at this time. Repeat angiogram was performed at this  time. Stiff Glidewire was then again advanced through the pigtail catheter into the right-sided branches. Pigtail catheter was removed. The coaxial diagnostic catheter and aspiration catheter were advanced into the right-sided branches. Catheter was removed on the wire. Aspiration thrombectomy was performed within the right-sided segmental branches. When the catheter became clogged with aspirated  material, the aspiration catheter was removed from the sheath and cleared of all broad products. At this time the sheath was confirmed to be clogged with some retained blood products/thrombus. The sheath was removed on the stiff Glidewire. Both the aspiration catheter and the sheath were cleared of all blood products and thrombus material. The introducer was then placed through the Gore dry seal sheath and the sheath was reinserted on the Glidewire into the femoral vein. The sheath was position at the base of the right atrium. Introducer was removed. Pigtail catheter was then again advanced through the sheath into the main pulmonary artery. Final pressure measurement was performed. At this time the pressure measurement had nearly normalized, to a mean pressure of 26. Additionally the patient's initial 15 L oxygen requirement was now decreased to 3 L for 100% oxygen saturation. We elected to terminate the case at this point. All catheters and wires were removed. The sheath was withdrawn and a stay suture was placed at the femoral vein puncture site. Manual pressure was performed for 20 minutes and then a compression dressing was added. The patient tolerated the procedure well. No complications were encountered. Estimated blood loss 300 cc. FINDINGS: Initial PA pressure: 54/35 (42) After left PA treatment: 42/30 (35) Final PA pressure after left & right treatment: 34/21 (26) IMPRESSION: Status post ultrasound guided access right common femoral vein for pulmonary angiogram/pressure measurements and mechanical/aspiration thrombectomy of bilateral pulmonary emboli for high risk submassive PE presentation. Signed, Yvone Neu. Miachel Roux, RPVI [facility] Radiology Electronically Signed   By: Gilmer Mor D.O.   On: 11/29/2021 15:19   IR THROMBECT PRIM MECH INIT (INCLU) MOD SED  Result Date: 11/29/2021 INDICATION: 71 year old male with submassive high-risk pulmonary  embolism, presents for treatment EXAM: ULTRASOUND-GUIDED ACCESS RIGHT COMMON FEMORAL VEIN PULMONARY ANGIOGRAM MECHANICAL/ASPIRATION THROMBECTOMY OF BILATERAL PULMONARY ARTERIES PULMONARY ARTERY PRESSURE MEASUREMENTS COMPARISON:  None MEDICATIONS: Eight thousand IV units heparin 500 cc normal saline bolus ANESTHESIA/SEDATION: Moderate (conscious) sedation was employed during this procedure. A total of Versed 1.0 mg and Fentanyl 25 mcg was administered intravenously by the radiology nurse. Total intra-service moderate Sedation Time: 106 minutes. The patient's level of consciousness and vital signs were monitored continuously by radiology nursing throughout the procedure under my direct supervision. FLUOROSCOPY: Radiation Exposure Index (as provided by the fluoroscopic device): 21 minutes, 18 seconds, 219 mGy Kerma COMPLICATIONS: None TECHNIQUE: Informed consent was obtained from the patient and the patient's family following explanation of the procedure, risks, benefits and alternatives. Specific risks include bleeding, infection, contrast reaction, kidney injury, venous injury, life-threatening hemorrhage, need for further surgery, need for further procedure, cardiopulmonary collapse, death. The patient understands, agrees and consents for the procedure. All questions were addressed. Patient is positioned supine position on the fluoroscopy table. Maximal barrier sterile technique utilized including caps, mask, sterile gowns, sterile gloves, large sterile drape, hand hygiene, and chlorhexidine prep. 1% lidocaine used for local anesthesia. Moderate sedation was provided. Ultrasound survey of the right inguinal region was performed with images stored and sent to PACs, confirming patency of the right common femoral vein. A single wall needle was used access the right common femoral vein  under ultrasound. With excellent blood flow returned, 035 wire was passed through the needle, observed to enter the IVC under  fluoroscopy. The needle was removed, and a standard 6 [place] vascular sheath was placed. The dilator was removed and the sheath was flushed. Combination of a angled pigtail catheter and a Bentson wire was then used to navigate to the right heart. Angled catheter used to select the main pulmonary artery. Wire was removed and angiogram was performed. Pressure measurement was obtained. Stiff Glidewire was then advanced through the pigtail catheter, reducing the curve and flipping into the left-sided pulmonary artery. The wire was straightened into the downgoing branches of the right pulmonary artery. Pigtail catheter was then removed. The 6 French sheath was removed. 66 French dilation was performed and then an 57 [place] Gore dry seal sheath was placed. Over the wire, a coaxial 125 cm H1 diagnostic catheter with 16 French H torque aspiration catheter were advanced into the downgoing branches of the left pulmonary arteries. The diagnostic catheter was removed, and aspiration was initiated with the 16 [place] device. After the initial aspiration attempt, there was some hemodynamic instability, with oxygen saturation dropping into the low [id number], systolic blood pressure dropping into the mid 60s, and the aspiration catheter became occluded. Stepwise removal of the aspiration catheter over the Glidewire was performed, ultimately with removal completely from the system as well as removal of the Gore dry seal sheath from the venous access to clear the catheter of retained thrombus. Once the catheter and the sheath were cleared of residual thrombus/blood products the dry seal sheath was advanced on the Glidewire to the level of the right atrium. Introducer was removed on the wire. The angled pigtail catheter was then used to select the main pulmonary artery. Stiff Glidewire was a again advanced through the pigtail catheter, selecting the left-sided branches. Pigtail catheter was removed. Placement of the coaxial H torque 16  French device and the 125 cm catheter was again into the downgoing branches on the left. Once the aspiration catheter was in position the coaxial catheter was removed from the wire. Aspiration was then performed of the left-sided pulmonary artery and the segmental arteries into the downgoing branches. Catheter was removed from the system for clearance. Once the sheath was clear to of all residual thrombus the pigtail catheter was advanced again through the right heart into the main pulmonary artery. A repeat pulmonary artery pressure was performed at this time. Repeat angiogram was performed at this time. Stiff Glidewire was then again advanced through the pigtail catheter into the right-sided branches. Pigtail catheter was removed. The coaxial diagnostic catheter and aspiration catheter were advanced into the right-sided branches. Catheter was removed on the wire. Aspiration thrombectomy was performed within the right-sided segmental branches. When the catheter became clogged with aspirated material, the aspiration catheter was removed from the sheath and cleared of all broad products. At this time the sheath was confirmed to be clogged with some retained blood products/thrombus. The sheath was removed on the stiff Glidewire. Both the aspiration catheter and the sheath were cleared of all blood products and thrombus material. The introducer was then placed through the Gore dry seal sheath and the sheath was reinserted on the Glidewire into the femoral vein. The sheath was position at the base of the right atrium. Introducer was removed. Pigtail catheter was then again advanced through the sheath into the main pulmonary artery. Final pressure measurement was performed. At this time the pressure measurement had nearly normalized, to a  mean pressure of 26. Additionally the patient's initial 15 L oxygen requirement was now decreased to 3 L for 100% oxygen saturation. We elected to terminate the case at this point. All  catheters and wires were removed. The sheath was withdrawn and a stay suture was placed at the femoral vein puncture site. Manual pressure was performed for 20 minutes and then a compression dressing was added. The patient tolerated the procedure well. No complications were encountered. Estimated blood loss 300 cc. FINDINGS: Initial PA pressure: 54/35 (42) After left PA treatment: 42/30 (35) Final PA pressure after left & right treatment: 34/21 (26) IMPRESSION: Status post ultrasound guided access right common femoral vein for pulmonary angiogram/pressure measurements and mechanical/aspiration thrombectomy of bilateral pulmonary emboli for high risk submassive PE presentation. Signed, Dulcy Fanny. Nadene Rubins, RPVI [facility] Inter[facility] Radiology Special[facility] Radiology Electronically Signed   By: Corrie Mckusick D.O.   On: 11/29/2021 15:19   IR THROMBECT PRIM MECH INIT (INCLU) MOD SED  Result Date: 11/29/2021 INDICATION: 71 year old male with submassive high-risk pulmonary embolism, presents for treatment EXAM: ULTRASOUND-GUIDED ACCESS RIGHT COMMON FEMORAL VEIN PULMONARY ANGIOGRAM MECHANICAL/ASPIRATION THROMBECTOMY OF BILATERAL PULMONARY ARTERIES PULMONARY ARTERY PRESSURE MEASUREMENTS COMPARISON:  None MEDICATIONS: Eight thousand IV units heparin 500 cc normal saline bolus ANESTHESIA/SEDATION: Moderate (conscious) sedation was employed during this procedure. A total of Versed 1.0 mg and Fentanyl 25 mcg was administered intravenously by the radiology nurse. Total intra-service moderate Sedation Time: 106 minutes. The patient's level of consciousness and vital signs were monitored continuously by radiology nursing throughout the procedure under my direct supervision. FLUOROSCOPY: Radiation Exposure Index (as provided by the fluoroscopic device): 21 minutes, 18 seconds, [id number] mGy Kerma COMPLICATIONS: None TECHNIQUE: Informed consent was obtained from the patient and the patient's family following  explanation of the procedure, risks, benefits and alternatives. Specific risks include bleeding, infection, contrast reaction, kidney injury, venous injury, life-threatening hemorrhage, need for further surgery, need for further procedure, cardiopulmonary collapse, death. The patient understands, agrees and consents for the procedure. All questions were addressed. Patient is positioned supine position on the fluoroscopy table. Maximal barrier sterile technique utilized including caps, mask, sterile gowns, sterile gloves, large sterile drape, hand hygiene, and chlorhexidine prep. 1% lidocaine used for local anesthesia. Moderate sedation was provided. Ultrasound survey of the right inguinal region was performed with images stored and sent to PACs, confirming patency of the right common femoral vein. A single wall needle was used access the right common femoral vein under ultrasound. With excellent blood flow returned, 035 wire was passed through the needle, observed to enter the IVC under fluoroscopy. The needle was removed, and a standard 6 [place] vascular sheath was placed. The dilator was removed and the sheath was flushed. Combination of a angled pigtail catheter and a Bentson wire was then used to navigate to the right heart. Angled catheter used to select the main pulmonary artery. Wire was removed and angiogram was performed. Pressure measurement was obtained. Stiff Glidewire was then advanced through the pigtail catheter, reducing the curve and flipping into the left-sided pulmonary artery. The wire was straightened into the downgoing branches of the right pulmonary artery. Pigtail catheter was then removed. The 6 French sheath was removed. 39 French dilation was performed and then an 3 [place] Gore dry seal sheath was placed. Over the wire, a coaxial 125 cm H1 diagnostic catheter with 16 French H torque aspiration catheter were advanced into the downgoing branches of the left pulmonary arteries. The  diagnostic catheter was removed,  and aspiration was initiated with the 16 [place] device. After the initial aspiration attempt, there was some hemodynamic instability, with oxygen saturation dropping into the low 80s, systolic blood pressure dropping into the mid 60s, and the aspiration catheter became occluded. Stepwise removal of the aspiration catheter over the Glidewire was performed, ultimately with removal completely from the system as well as removal of the Gore dry seal sheath from the venous access to clear the catheter of retained thrombus. Once the catheter and the sheath were cleared of residual thrombus/blood products the dry seal sheath was advanced on the Glidewire to the level of the right atrium. Introducer was removed on the wire. The angled pigtail catheter was then used to select the main pulmonary artery. Stiff Glidewire was a again advanced through the pigtail catheter, selecting the left-sided branches. Pigtail catheter was removed. Placement of the coaxial H torque 16 French device and the 125 cm catheter was again into the downgoing branches on the left. Once the aspiration catheter was in position the coaxial catheter was removed from the wire. Aspiration was then performed of the left-sided pulmonary artery and the segmental arteries into the downgoing branches. Catheter was removed from the system for clearance. Once the sheath was clear to of all residual thrombus the pigtail catheter was advanced again through the right heart into the main pulmonary artery. A repeat pulmonary artery pressure was performed at this time. Repeat angiogram was performed at this time. Stiff Glidewire was then again advanced through the pigtail catheter into the right-sided branches. Pigtail catheter was removed. The coaxial diagnostic catheter and aspiration catheter were advanced into the right-sided branches. Catheter was removed on the wire. Aspiration thrombectomy was performed within the right-sided  segmental branches. When the catheter became clogged with aspirated material, the aspiration catheter was removed from the sheath and cleared of all broad products. At this time the sheath was confirmed to be clogged with some retained blood products/thrombus. The sheath was removed on the stiff Glidewire. Both the aspiration catheter and the sheath were cleared of all blood products and thrombus material. The introducer was then placed through the Gore dry seal sheath and the sheath was reinserted on the Glidewire into the femoral vein. The sheath was position at the base of the right atrium. Introducer was removed. Pigtail catheter was then again advanced through the sheath into the main pulmonary artery. Final pressure measurement was performed. At this time the pressure measurement had nearly normalized, to a mean pressure of 26. Additionally the patient's initial 15 L oxygen requirement was now decreased to 3 L for 100% oxygen saturation. We elected to terminate the case at this point. All catheters and wires were removed. The sheath was withdrawn and a stay suture was placed at the femoral vein puncture site. Manual pressure was performed for 20 minutes and then a compression dressing was added. The patient tolerated the procedure well. No complications were encountered. Estimated blood loss 300 cc. FINDINGS: Initial PA pressure: 54/35 (42) After left PA treatment: 42/30 (35) Final PA pressure after left & right treatment: 34/21 (26) IMPRESSION: Status post ultrasound guided access right common femoral vein for pulmonary angiogram/pressure measurements and mechanical/aspiration thrombectomy of bilateral pulmonary emboli for high risk submassive PE presentation. Signed, Yvone Neu. Miachel Roux, RPVI [facility] Inter[facility] Radiology Special[facility] Radiology Electronically Signed   By: Gilmer Mor D.O.   On: 11/29/2021 15:19   IR Angiogram Pulmonary Bilateral Selective  Result Date:  11/29/2021 INDICATION: 71 year old male with  submassive high-risk pulmonary embolism, presents for treatment EXAM: ULTRASOUND-GUIDED ACCESS RIGHT COMMON FEMORAL VEIN PULMONARY ANGIOGRAM MECHANICAL/ASPIRATION THROMBECTOMY OF BILATERAL PULMONARY ARTERIES PULMONARY ARTERY PRESSURE MEASUREMENTS COMPARISON:  None MEDICATIONS: Eight thousand IV units heparin 500 cc normal saline bolus ANESTHESIA/SEDATION: Moderate (conscious) sedation was employed during this procedure. A total of Versed 1.0 mg and Fentanyl 25 mcg was administered intravenously by the radiology nurse. Total intra-service moderate Sedation Time: 106 minutes. The patient's level of consciousness and vital signs were monitored continuously by radiology nursing throughout the procedure under my direct supervision. FLUOROSCOPY: Radiation Exposure Index (as provided by the fluoroscopic device): 21 minutes, 18 seconds, [id number] mGy Kerma COMPLICATIONS: None TECHNIQUE: Informed consent was obtained from the patient and the patient's family following explanation of the procedure, risks, benefits and alternatives. Specific risks include bleeding, infection, contrast reaction, kidney injury, venous injury, life-threatening hemorrhage, need for further surgery, need for further procedure, cardiopulmonary collapse, death. The patient understands, agrees and consents for the procedure. All questions were addressed. Patient is positioned supine position on the fluoroscopy table. Maximal barrier sterile technique utilized including caps, mask, sterile gowns, sterile gloves, large sterile drape, hand hygiene, and chlorhexidine prep. 1% lidocaine used for local anesthesia. Moderate sedation was provided. Ultrasound survey of the right inguinal region was performed with images stored and sent to PACs, confirming patency of the right common femoral vein. A single wall needle was used access the right common femoral vein under ultrasound. With excellent blood flow returned, 035  wire was passed through the needle, observed to enter the IVC under fluoroscopy. The needle was removed, and a standard 6 [place] vascular sheath was placed. The dilator was removed and the sheath was flushed. Combination of a angled pigtail catheter and a Bentson wire was then used to navigate to the right heart. Angled catheter used to select the main pulmonary artery. Wire was removed and angiogram was performed. Pressure measurement was obtained. Stiff Glidewire was then advanced through the pigtail catheter, reducing the curve and flipping into the left-sided pulmonary artery. The wire was straightened into the downgoing branches of the right pulmonary artery. Pigtail catheter was then removed. The 6 French sheath was removed. 58 French dilation was performed and then an 75 [place] Gore dry seal sheath was placed. Over the wire, a coaxial 125 cm H1 diagnostic catheter with 16 French H torque aspiration catheter were advanced into the downgoing branches of the left pulmonary arteries. The diagnostic catheter was removed, and aspiration was initiated with the 16 [place] device. After the initial aspiration attempt, there was some hemodynamic instability, with oxygen saturation dropping into the low 123XX123, systolic blood pressure dropping into the mid 60s, and the aspiration catheter became occluded. Stepwise removal of the aspiration catheter over the Glidewire was performed, ultimately with removal completely from the system as well as removal of the Gore dry seal sheath from the venous access to clear the catheter of retained thrombus. Once the catheter and the sheath were cleared of residual thrombus/blood products the dry seal sheath was advanced on the Glidewire to the level of the right atrium. Introducer was removed on the wire. The angled pigtail catheter was then used to select the main pulmonary artery. Stiff Glidewire was a again advanced through the pigtail catheter, selecting the left-sided branches.  Pigtail catheter was removed. Placement of the coaxial H torque 16 French device and the 125 cm catheter was again into the downgoing branches on the left. Once the aspiration catheter was in position the coaxial catheter  was removed from the wire. Aspiration was then performed of the left-sided pulmonary artery and the segmental arteries into the downgoing branches. Catheter was removed from the system for clearance. Once the sheath was clear to of all residual thrombus the pigtail catheter was advanced again through the right heart into the main pulmonary artery. A repeat pulmonary artery pressure was performed at this time. Repeat angiogram was performed at this time. Stiff Glidewire was then again advanced through the pigtail catheter into the right-sided branches. Pigtail catheter was removed. The coaxial diagnostic catheter and aspiration catheter were advanced into the right-sided branches. Catheter was removed on the wire. Aspiration thrombectomy was performed within the right-sided segmental branches. When the catheter became clogged with aspirated material, the aspiration catheter was removed from the sheath and cleared of all broad products. At this time the sheath was confirmed to be clogged with some retained blood products/thrombus. The sheath was removed on the stiff Glidewire. Both the aspiration catheter and the sheath were cleared of all blood products and thrombus material. The introducer was then placed through the Gore dry seal sheath and the sheath was reinserted on the Glidewire into the femoral vein. The sheath was position at the base of the right atrium. Introducer was removed. Pigtail catheter was then again advanced through the sheath into the main pulmonary artery. Final pressure measurement was performed. At this time the pressure measurement had nearly normalized, to a mean pressure of 26. Additionally the patient's initial 15 L oxygen requirement was now decreased to 3 L for 100%  oxygen saturation. We elected to terminate the case at this point. All catheters and wires were removed. The sheath was withdrawn and a stay suture was placed at the femoral vein puncture site. Manual pressure was performed for 20 minutes and then a compression dressing was added. The patient tolerated the procedure well. No complications were encountered. Estimated blood loss 300 cc. FINDINGS: Initial PA pressure: 54/35 (42) After left PA treatment: 42/30 (35) Final PA pressure after left & right treatment: 34/21 (26) IMPRESSION: Status post ultrasound guided access right common femoral vein for pulmonary angiogram/pressure measurements and mechanical/aspiration thrombectomy of bilateral pulmonary emboli for high risk submassive PE presentation. Signed, Dulcy Fanny. Nadene Rubins, RPVI [facility] Radiology Electronically Signed   By: Corrie Mckusick D.O.   On: 11/29/2021 15:19   IR Angiogram Selective Each Additional Vessel  Result Date: 11/29/2021 INDICATION: 71 year old male with submassive high-risk pulmonary embolism, presents for treatment EXAM: ULTRASOUND-GUIDED ACCESS RIGHT COMMON FEMORAL VEIN PULMONARY ANGIOGRAM MECHANICAL/ASPIRATION THROMBECTOMY OF BILATERAL PULMONARY ARTERIES PULMONARY ARTERY PRESSURE MEASUREMENTS COMPARISON:  None MEDICATIONS: Eight thousand IV units heparin 500 cc normal saline bolus ANESTHESIA/SEDATION: Moderate (conscious) sedation was employed during this procedure. A total of Versed 1.0 mg and Fentanyl 25 mcg was administered intravenously by the radiology nurse. Total intra-service moderate Sedation Time: 106 minutes. The patient's level of consciousness and vital signs were monitored continuously by radiology nursing throughout the procedure under my direct supervision. FLUOROSCOPY: Radiation Exposure Index (as provided by the fluoroscopic device): 21 minutes, 18 seconds, [id number] mGy Kerma COMPLICATIONS: None TECHNIQUE: Informed  consent was obtained from the patient and the patient's family following explanation of the procedure, risks, benefits and alternatives. Specific risks include bleeding, infection, contrast reaction, kidney injury, venous injury, life-threatening hemorrhage, need for further surgery, need for further procedure, cardiopulmonary collapse, death. The patient understands, agrees and consents for the procedure. All questions were addressed. Patient is positioned supine position on  the fluoroscopy table. Maximal barrier sterile technique utilized including caps, mask, sterile gowns, sterile gloves, large sterile drape, hand hygiene, and chlorhexidine prep. 1% lidocaine used for local anesthesia. Moderate sedation was provided. Ultrasound survey of the right inguinal region was performed with images stored and sent to PACs, confirming patency of the right common femoral vein. A single wall needle was used access the right common femoral vein under ultrasound. With excellent blood flow returned, 035 wire was passed through the needle, observed to enter the IVC under fluoroscopy. The needle was removed, and a standard 6 [place] vascular sheath was placed. The dilator was removed and the sheath was flushed. Combination of a angled pigtail catheter and a Bentson wire was then used to navigate to the right heart. Angled catheter used to select the main pulmonary artery. Wire was removed and angiogram was performed. Pressure measurement was obtained. Stiff Glidewire was then advanced through the pigtail catheter, reducing the curve and flipping into the left-sided pulmonary artery. The wire was straightened into the downgoing branches of the right pulmonary artery. Pigtail catheter was then removed. The 6 French sheath was removed. 36 French dilation was performed and then an 23 [place] Gore dry seal sheath was placed. Over the wire, a coaxial 125 cm H1 diagnostic catheter with 16 French H torque aspiration catheter were  advanced into the downgoing branches of the left pulmonary arteries. The diagnostic catheter was removed, and aspiration was initiated with the 16 [place] device. After the initial aspiration attempt, there was some hemodynamic instability, with oxygen saturation dropping into the low 123XX123, systolic blood pressure dropping into the mid 60s, and the aspiration catheter became occluded. Stepwise removal of the aspiration catheter over the Glidewire was performed, ultimately with removal completely from the system as well as removal of the Gore dry seal sheath from the venous access to clear the catheter of retained thrombus. Once the catheter and the sheath were cleared of residual thrombus/blood products the dry seal sheath was advanced on the Glidewire to the level of the right atrium. Introducer was removed on the wire. The angled pigtail catheter was then used to select the main pulmonary artery. Stiff Glidewire was a again advanced through the pigtail catheter, selecting the left-sided branches. Pigtail catheter was removed. Placement of the coaxial H torque 16 French device and the 125 cm catheter was again into the downgoing branches on the left. Once the aspiration catheter was in position the coaxial catheter was removed from the wire. Aspiration was then performed of the left-sided pulmonary artery and the segmental arteries into the downgoing branches. Catheter was removed from the system for clearance. Once the sheath was clear to of all residual thrombus the pigtail catheter was advanced again through the right heart into the main pulmonary artery. A repeat pulmonary artery pressure was performed at this time. Repeat angiogram was performed at this time. Stiff Glidewire was then again advanced through the pigtail catheter into the right-sided branches. Pigtail catheter was removed. The coaxial diagnostic catheter and aspiration catheter were advanced into the right-sided branches. Catheter was removed on  the wire. Aspiration thrombectomy was performed within the right-sided segmental branches. When the catheter became clogged with aspirated material, the aspiration catheter was removed from the sheath and cleared of all broad products. At this time the sheath was confirmed to be clogged with some retained blood products/thrombus. The sheath was removed on the stiff Glidewire. Both the aspiration catheter and the sheath were cleared of all blood products and  thrombus material. The introducer was then placed through the Gore dry seal sheath and the sheath was reinserted on the Glidewire into the femoral vein. The sheath was position at the base of the right atrium. Introducer was removed. Pigtail catheter was then again advanced through the sheath into the main pulmonary artery. Final pressure measurement was performed. At this time the pressure measurement had nearly normalized, to a mean pressure of 26. Additionally the patient's initial 15 L oxygen requirement was now decreased to 3 L for 100% oxygen saturation. We elected to terminate the case at this point. All catheters and wires were removed. The sheath was withdrawn and a stay suture was placed at the femoral vein puncture site. Manual pressure was performed for 20 minutes and then a compression dressing was added. The patient tolerated the procedure well. No complications were encountered. Estimated blood loss 300 cc. FINDINGS: Initial PA pressure: 54/35 (42) After left PA treatment: 42/30 (35) Final PA pressure after left & right treatment: 34/21 (26) IMPRESSION: Status post ultrasound guided access right common femoral vein for pulmonary angiogram/pressure measurements and mechanical/aspiration thrombectomy of bilateral pulmonary emboli for high risk submassive PE presentation. Signed, Dulcy Fanny. Nadene Rubins, RPVI [facility] Inter[facility] Radiology Special[facility] Radiology Electronically Signed   By: Corrie Mckusick D.O.   On: 11/29/2021 15:19    IR Angiogram Selective Each Additional Vessel  Result Date: 11/29/2021 INDICATION: 71 year old male with submassive high-risk pulmonary embolism, presents for treatment EXAM: ULTRASOUND-GUIDED ACCESS RIGHT COMMON FEMORAL VEIN PULMONARY ANGIOGRAM MECHANICAL/ASPIRATION THROMBECTOMY OF BILATERAL PULMONARY ARTERIES PULMONARY ARTERY PRESSURE MEASUREMENTS COMPARISON:  None MEDICATIONS: Eight thousand IV units heparin 500 cc normal saline bolus ANESTHESIA/SEDATION: Moderate (conscious) sedation was employed during this procedure. A total of Versed 1.0 mg and Fentanyl 25 mcg was administered intravenously by the radiology nurse. Total intra-service moderate Sedation Time: 106 minutes. The patient's level of consciousness and vital signs were monitored continuously by radiology nursing throughout the procedure under my direct supervision. FLUOROSCOPY: Radiation Exposure Index (as provided by the fluoroscopic device): 21 minutes, 18 seconds, [id number] mGy Kerma COMPLICATIONS: None TECHNIQUE: Informed consent was obtained from the patient and the patient's family following explanation of the procedure, risks, benefits and alternatives. Specific risks include bleeding, infection, contrast reaction, kidney injury, venous injury, life-threatening hemorrhage, need for further surgery, need for further procedure, cardiopulmonary collapse, death. The patient understands, agrees and consents for the procedure. All questions were addressed. Patient is positioned supine position on the fluoroscopy table. Maximal barrier sterile technique utilized including caps, mask, sterile gowns, sterile gloves, large sterile drape, hand hygiene, and chlorhexidine prep. 1% lidocaine used for local anesthesia. Moderate sedation was provided. Ultrasound survey of the right inguinal region was performed with images stored and sent to PACs, confirming patency of the right common femoral vein. A single wall needle was used access the right common femoral  vein under ultrasound. With excellent blood flow returned, 035 wire was passed through the needle, observed to enter the IVC under fluoroscopy. The needle was removed, and a standard 6 [place] vascular sheath was placed. The dilator was removed and the sheath was flushed. Combination of a angled pigtail catheter and a Bentson wire was then used to navigate to the right heart. Angled catheter used to select the main pulmonary artery. Wire was removed and angiogram was performed. Pressure measurement was obtained. Stiff Glidewire was then advanced through the pigtail catheter, reducing the curve and flipping into the left-sided pulmonary artery. The wire was straightened into the downgoing  branches of the right pulmonary artery. Pigtail catheter was then removed. The 6 French sheath was removed. 2 French dilation was performed and then an 37 [place] Gore dry seal sheath was placed. Over the wire, a coaxial 125 cm H1 diagnostic catheter with 16 [place] H torque aspiration catheter were advanced into the downgoing branches of the left pulmonary arteries. The diagnostic catheter was removed, and aspiration was initiated with the 16 [place] device. After the initial aspiration attempt, there was some hemodynamic instability, with oxygen saturation dropping into the low 123XX123, systolic blood pressure dropping into the mid 60s, and the aspiration catheter became occluded. Stepwise removal of the aspiration catheter over the Glidewire was performed, ultimately with removal completely from the system as well as removal of the [place] dry seal sheath from the venous access to clear the catheter of retained thrombus. Once the catheter and the sheath were cleared of residual thrombus/blood products the dry seal sheath was advanced on the Glidewire to the level of the right atrium. Introducer was removed on the wire. The angled pigtail catheter was then used to select the main pulmonary artery. Stiff Glidewire was a again advanced  through the pigtail catheter, selecting the left-sided branches. Pigtail catheter was removed. Placement of the coaxial H torque 16 French device and the 125 cm catheter was again into the downgoing branches on the left. Once the aspiration catheter was in position the coaxial catheter was removed from the wire. Aspiration was then performed of the left-sided pulmonary artery and the segmental arteries into the downgoing branches. Catheter was removed from the system for clearance. Once the sheath was clear to of all residual thrombus the pigtail catheter was advanced again through the right heart into the main pulmonary artery. A repeat pulmonary artery pressure was performed at this time. Repeat angiogram was performed at this time. Stiff Glidewire was then again advanced through the pigtail catheter into the right-sided branches. Pigtail catheter was removed. The coaxial diagnostic catheter and aspiration catheter were advanced into the right-sided branches. Catheter was removed on the wire. Aspiration thrombectomy was performed within the right-sided segmental branches. When the catheter became clogged with aspirated material, the aspiration catheter was removed from the sheath and cleared of all broad products. At this time the sheath was confirmed to be clogged with some retained blood products/thrombus. The sheath was removed on the stiff Glidewire. Both the aspiration catheter and the sheath were cleared of all blood products and thrombus material. The introducer was then placed through the [place] dry seal sheath and the sheath was reinserted on the Glidewire into the femoral vein. The sheath was position at the base of the right atrium. Introducer was removed. Pigtail catheter was then again advanced through the sheath into the main pulmonary artery. Final pressure measurement was performed. At this time the pressure measurement had nearly normalized, to a mean pressure of 26. Additionally the patient's  initial 15 L oxygen requirement was now decreased to 3 L for 100% oxygen saturation. We elected to terminate the case at this point. All catheters and wires were removed. The sheath was withdrawn and a stay suture was placed at the femoral vein puncture site. Manual pressure was performed for 20 minutes and then a compression dressing was added. The patient tolerated the procedure well. No complications were encountered. Estimated blood loss 300 cc. FINDINGS: Initial PA pressure: 54/35 (42) After left PA treatment: 42/30 (35) Final PA pressure after left & right treatment: 34/21 (26) IMPRESSION: Status post ultrasound  guided access right common femoral vein for pulmonary angiogram/pressure measurements and mechanical/aspiration thrombectomy of bilateral pulmonary emboli for high risk submassive PE presentation. Signed, Dulcy Fanny. Nadene Rubins, RPVI [facility] Inter[facility] Radiology Specialists [facility] Radiology Electronically Signed   By: Corrie Mckusick D.O.   On: 11/29/2021 15:19   CT Angio Chest PE W and/or Wo Contrast  Result Date: 11/29/2021 CLINICAL DATA:  Syncopal episode with loss of consciousness and fall injury. EXAM: CT ANGIOGRAPHY CHEST WITH CONTRAST TECHNIQUE: Multidetector CT imaging of the chest was performed using the standard protocol during bolus administration of intravenous contrast. Multiplanar CT image reconstructions and MIPs were obtained to evaluate the vascular anatomy. RADIATION DOSE REDUCTION: This exam was performed according to the departmental dose-optimization program which includes automated exposure control, adjustment of the mA and/or kV according to patient size and/or use of iterative reconstruction technique. CONTRAST:  168mL OMNIPAQUE IOHEXOL 350 MG/ML SOLN COMPARISON:  Portable chest today, portable chest 03/07/2020, chest CT with contrast 12/01/2008. FINDINGS: Cardiovascular: There is mild cardiomegaly, no pericardial effusion and no visible coronary calcifications.  There is elevated RV LV ratio of 1.49 and IVC reflux indicating right heart strain, prominent pulmonary trunk and main arteries with the pulmonary trunk 3.5 cm, and a large bilateral pulmonary arterial embolic burden. There is a band embolus partially filling the left main pulmonary artery, smaller band of embolus in the distal right main pulmonary artery. In the upper lobes there are multiple partially occlusive segmental and subsegmental emboli. In the right lower lobe there are nonocclusive segmental and scattered occlusive subsegmental emboli to the basal segments, segmental embolus to the right lower lobe superior segment, and multiple segmental and subsegmental occlusive emboli in the left lower lobe. There is a nonocclusive embolus in the right middle lobe interlobar artery. There is aortic tortuosity and moderate patchy atherosclerosis, scattered plaques in the great vessels without stenosis or dissection. Dilatation of the aortic root is seen up to 4.5 cm at the sinuses of Valsalva. The rest is within normal caliber limits. Pulmonary veins are normal caliber. Mediastinum/Nodes: No enlarged mediastinal, hilar, or axillary lymph nodes. Thyroid gland, trachea, and esophagus demonstrate no significant findings. Lungs/Pleura: There are patchy peripheral ground-glass opacities in the left upper and both lower lobes, moderate posterior atelectasis in the lower lobes. There is no pleural effusion, thickening or pneumothorax. Mild bronchial thickening in the lower lobes. Upper Abdomen: No acute finding. Small stones layer in the gallbladder. Musculoskeletal: No regional skeletal fracture is seen. Review of the MIP images confirms the above findings. IMPRESSION: Bilateral acute arterial emboli with large clot burden and right heart strain findings. Positive for acute PE with CT evidence of right heart strain (RV/LV Ratio = 1.49) consistent with at least submassive (intermediate risk) PE. The presence of right heart  strain has been associated with an increased risk of morbidity and mortality. Please refer to the "Code PE Focused" order set in EPIC. Aortic atherosclerosis with 4.5 cm dilatation of the aortic root. Annual CTA or MRA follow-up and vascular surgery consult recommended. Mild cardiomegaly. 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 Background bronchitis. Cholelithiasis. Results phoned to Dr. Langston Masker in the ED at 7:48 a.m., 11/29/2021 with verbal acknowledgement of key findings. Electronically Signed   By: Telford Nab M.D.   On: 11/29/2021 08:05   CT Cervical Spine Wo Contrast  Result Date: 11/29/2021 CLINICAL DATA:  Fall getting into truck.  Trauma to head. EXAM:  CT CERVICAL SPINE WITHOUT CONTRAST TECHNIQUE: Multidetector CT imaging of the cervical spine was performed without intravenous contrast. Multiplanar CT image reconstructions were also generated. RADIATION DOSE REDUCTION: This exam was performed according to the departmental dose-optimization program which includes automated exposure control, adjustment of the mA and/or kV according to patient size and/or use of iterative reconstruction technique. COMPARISON:  None Available. FINDINGS: Alignment: Slight retrolisthesis at C3-4 is degenerative. No traumatic listhesis is present. Straightening of the normal cervical lordosis is likely chronic. Skull base and vertebrae: Craniocervical junction is within normal limits. No acute fractures are present. Soft tissues and spinal canal: No prevertebral fluid or swelling. No visible canal hematoma. Disc levels: Moderate to severe left foraminal stenosis present at C3-4 and C4-5 due to uncovertebral and facet spurring. Moderate bilateral foraminal narrowing scratched at severe right and moderate left foraminal narrowing is present at C5-6. Upper chest: The lung apices are clear. Thoracic inlet is within normal limits. IMPRESSION: 1.  No acute fracture or traumatic listhesis. 2. Multilevel degenerative disc disease as described. These results were called by telephone at the time of interpretation on 11/29/2021 at 8:04 am to provider Dr. Langston Masker, who verbally acknowledged these results. Electronically Signed   By: San Morelle M.D.   On: 11/29/2021 08:05   CT Head Wo Contrast  Result Date: 11/29/2021 CLINICAL DATA:  Head trauma. [place] getting into his truck. Abrasion to left side of forehead and cheek. EXAM: CT HEAD WITHOUT CONTRAST TECHNIQUE: Contiguous axial images were obtained from the base of the skull through the vertex without intravenous contrast. RADIATION DOSE REDUCTION: This exam was performed according to the departmental dose-optimization program which includes automated exposure control, adjustment of the mA and/or kV according to patient size and/or use of iterative reconstruction technique. COMPARISON:  None Available. FINDINGS: Brain: No acute infarct, hemorrhage, or mass lesion is present. Atrophy and white matter changes are mildly advanced for age. Basal ganglia are intact. No acute or focal cortical abnormalities are present. The ventricles are of normal size. No significant extraaxial fluid collection is present. 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 The splenium of the corpus callosum is incompletely formed. No other midline abnormalities are present. The brainstem and cerebellum are within normal limits. Vascular: No hyperdense vessel or unexpected calcification. Skull: A left supraorbital and frontal scalp laceration and hematoma is present. No underlying fracture or foreign body is present. Soft tissue swelling extends along the lateral left orbit and over the left maxilla. The zygomatic arch is intact. Sinuses/Orbits: The paranasal sinuses and mastoid air cells are clear. The globes and orbits are within normal limits. IMPRESSION: 1. Left supraorbital and frontal scalp laceration and  hematoma without underlying fracture or foreign body. 2. Soft tissue swelling extends along the lateral left orbit and over the left maxilla. 3. No acute intracranial abnormality. 4. Atrophy and white matter changes are mildly advanced for age. This likely reflects the sequela of chronic microvascular ischemia. These results were called by telephone at the time of interpretation on 11/29/2021 at 8:01 am to provider Dr. Langston Masker, who verbally acknowledged these results. Electronically Signed   By: San Morelle M.D.   On: 11/29/2021 08:02   CT ABDOMEN PELVIS W CONTRAST  Result Date: 11/29/2021 CLINICAL DATA:  [place] getting into truck today.  Trauma to head. EXAM: CT ABDOMEN AND PELVIS WITH CONTRAST TECHNIQUE: Multidetector CT imaging of the abdomen and pelvis was performed using the standard protocol following bolus administration of intravenous contrast. RADIATION DOSE  REDUCTION: This exam was performed according to the departmental dose-optimization program which includes automated exposure control, adjustment of the mA and/or kV according to patient size and/or use of iterative reconstruction technique. CONTRAST:  167mL OMNIPAQUE IOHEXOL 350 MG/ML SOLN COMPARISON:  CT a of the chest 11/29/2021. CT abdomen pelvis 12/10/2008 FINDINGS: Lower chest: Pulmonary artery are noted at the scratched at pulmonary emboli are noted in the right main outflow tract and left lower lobar pulmonary arteries. Right heart strain is evident. Minimal scarring or atelectasis is present at both lung bases. No pleural effusion is present. Hepatobiliary: Small layering stones are present at the neck of the gallbladder. No inflammatory changes are present. Liver is unremarkable. No acute trauma. Pancreas: Unremarkable. No pancreatic ductal dilatation or surrounding inflammatory changes. Spleen: No splenic injury or perisplenic hematoma. Adrenals/Urinary Tract: No adrenal hemorrhage or renal injury identified. Bladder is unremarkable. A  17 mm simple cyst is present posterior left kidney. Recommend no imaging follow-up. Stomach/Bowel: Stomach and duodenum are within normal limits. Small bowel is unremarkable. Terminal ileum is within normal limits. The appendix is visualized and normal. Diverticular present in the ascending colon near the hepatic flexure. Additional diverticular present through the transverse colon and particularly in the distal descending and sigmoid colon without inflammation to suggest diverticulitis. Vascular/Lymphatic: Atherosclerotic calcifications are present in the aorta and branch vessels. Maximal diameter is 2.4 cm. No aneurysm is present. No focal stenosis is evident. No significant adenopathy is present. Reproductive: Prostate is unremarkable.  Bilateral hydrocele noted. Other: Fat herniates into the inguinal canals bilaterally. No other significant ventral hernia is present. No bowel herniates. Musculoskeletal: Vertebral body heights and alignment are normal. No acute fracture or traumatic subluxation is present. Degenerative changes are present in the lumbar spine with left greater than right stenosis at L4-5 and L5-S1. The bony pelvis is within normal limits. The hips are located and normal. IMPRESSION: 1. No acute trauma to the abdomen or pelvis. 2. Bilateral pulmonary emboli with right heart strain. 3. Cholelithiasis without evidence for cholecystitis. 4. Extensive colonic diverticulosis without diverticulitis. 5. Fat herniates into the inguinal canals bilaterally. No bowel herniates. 6. Degenerative changes in the lumbar spine with left greater than right stenosis at L4-5 and L5-S1. 7. Aortic Atherosclerosis (ICD10-I70.0). Critical Value/emergent results were called by telephone at the time of interpretation on 11/29/2021 at 7:58 am to provider Dr. Langston Masker, who verbally acknowledged these results. Electronically Signed   By: San Morelle M.D.   On: 11/29/2021 07:58   DG Chest Portable 1 View  Result Date:  11/29/2021 CLINICAL DATA:  Loss of consciousness, lightheadedness. EXAM: PORTABLE CHEST 1 VIEW COMPARISON:  Portable chest 03/07/2020. FINDINGS: There is mild cardiomegaly. No vascular congestion is seen. There is aortic calcification with stable mediastinal configuration. The lungs are clear of infiltrates. There is linear scarring right mid field along the short fissure. No pleural effusion is seen. There is thoracic spondylosis. The thoracic cage intact. There is overlying monitor wiring. IMPRESSION: No acute chest findings. Mild cardiomegaly. Aortic atherosclerosis. Compare: Interval resolution of prior bilateral peripheral infiltrates. Electronically Signed   By: Telford Nab M.D.   On: 11/29/2021 06:25    Labs:  CBC: Recent Labs    11/29/21 0625 11/29/21 0640 11/30/21 0413  WBC 19.1*  --  12.0*  HGB 14.1 13.9 11.4*  HCT 41.5 41.0 33.6*  PLT 158  --  137*    COAGS: Recent Labs    11/29/21 2135  INR 1.4*  APTT 129*  BMP: Recent Labs    11/29/21 0625 11/29/21 0640 11/29/21 0929 11/30/21 0413  NA 137 139 140 137  K 2.8* 3.0* 3.6 3.4*  CL 103 100 102 105  CO2 24  --  28 24  GLUCOSE 212* 216* 178* 114*  BUN 18 20 15 20   CALCIUM 8.2*  --  8.0* 7.8*  CREATININE 1.35* 1.20 1.28* 1.28*  GFRNONAA 57*  --  >60 >60    LIVER FUNCTION TESTS: Recent Labs    11/29/21 0625 11/30/21 0413  BILITOT 1.0 0.7  AST 21 25  ALT 15 16  ALKPHOS 84 60  PROT 6.1* 5.0*  ALBUMIN 3.3* 2.6*    Assessment and Plan:  71 y.o. male with submassive PE s/p mechanical thrombectomy by Dr. Earleen Newport on 9/6. Hematoma from R CFV punctures site was present post-op, some worsening post op, expected after access.  Expected ecchymosis.    Patient stable, VSS Hgb dropped slightly (13.9>11.4,) w/n expected range.  RF stable, mildly elevated creatinine unchanged from yesterday  R CFV site with non tender ecchymosis at 4 and 10 o'clock, it is not enlarging  Pressure dressing and suture was removed at  bedside w/o difficulty, no active bleeding noted.    Further treatment plan per PCCM/ TRH Appreciate and agree with the plan.  Please call IR for questions and concerns.    Electronically Signed: Tera Mater, PA-C 11/30/2021, 12:02 PM   I spent a total of 15 Minutes at the the patient's bedside AND on the patient's hospital floor or unit, greater than 50% of which was counseling/coordinating care for PE thrombectomy f/u.   This chart was dictated using voice recognition software.  Despite best efforts to proofread,  errors can occur which can change the documentation meaning.

## 2021-11-30 NOTE — Progress Notes (Signed)
[facility] ADULT ICU REPLACEMENT PROTOCOL   The patient does apply for the [facility] Adult ICU Electrolyte Replacment Protocol based on the criteria listed below:   1.Exclusion criteria: TCTS patients, ECMO patients, and Dialysis patients 2. Is GFR >/= 30 ml/min? Yes.    Patient's GFR today is >60 3. Is SCr </= 2? Yes.   Patient's SCr is 1.28 mg/dL 4. Did SCr increase >/= 0.5 in 24 hours? No 5.Pt's weight >40kg  Yes.   6. Abnormal electrolyte(s): K+ 3.4  7. Electrolytes replaced per protocol 8.  Call MD STAT for K+ </= 2.5, Phos </= 1, or Mag </= 1 Physician:  n/a  Melvern Banker 11/30/2021 6:16 AM

## 2021-11-30 NOTE — Progress Notes (Addendum)
ANTICOAGULATION CONSULT NOTE   Pharmacy Consult for heparin  Indication: pulmonary embolus  Allergies  Allergen Reactions   Enalapril Cough    Patient Measurements: Height: 6\' 2"  (188 cm) Weight: 103.8 kg (228 lb 13.4 oz) IBW/kg (Calculated) : 82.2 Heparin Dosing Weight: 103kg   Vital Signs: Temp: 98.2 F (36.8 C) (09/07 1815) Temp Source: Oral (09/07 1815) BP: 128/83 (09/07 1815) Pulse Rate: 92 (09/07 1815)  Labs: Recent Labs    11/29/21 0625 11/29/21 0640 11/29/21 01/29/22 11/29/21 0929 11/29/21 1510 11/29/21 2135 11/30/21 0413 11/30/21 0926 11/30/21 1822  HGB 14.1 13.9  --   --   --   --  11.4*  --   --   HCT 41.5 41.0  --   --   --   --  33.6*  --   --   PLT 158  --   --   --   --   --  137*  --   --   APTT  --   --   --   --   --  129*  --   --   --   LABPROT  --   --   --   --   --  17.3*  --   --   --   INR  --   --   --   --   --  1.4*  --   --   --   HEPARINUNFRC  --   --   --   --  >1.10*  --   --  0.28* 0.10*  HEPRLOWMOCWT  --   --   --   --   --  0.45  --   --   --   CREATININE 1.35* 1.20  --  1.28*  --   --  1.28*  --   --   CKTOTAL 241  --   --   --   --   --   --   --   --   TROPONINIHS 140*  --  984*  --   --   --   --   --   --      Estimated Creatinine Clearance: 70.9 mL/min (A) (by C-G formula based on SCr of 1.28 mg/dL (H)).   Medical History: Past Medical History:  Diagnosis Date   Hypertension    Assessment: 71 y.o M with history of PE/DVT not on anticoagulation PTA admitted for a chief complaint of syncope. CTA + for acute PE with evidence of heart strain consistent with submassive PE. Also, noted with a history of Afib.  Heparin level 0.1 (subtherapeutic). No issues with infusion per RN.  Goal of Therapy:  Heparin level 0.3-0.7 units/ml Monitor platelets by anticoagulation protocol: Yes   Plan:  Will give a half bolus of 1500 units IV x 1 due to bleeding from suture site Increase heparin infusion to 1900 units/hr Check heparin  level in 6 hours and daily while on heparin Continue to monitor H&H and platelets  75, PharmD, [facility] Clinical Pharmacist Please see AMION for all Pharmacists' Contact Phone Numbers 11/30/2021, 9:31 PM

## 2021-11-30 NOTE — Progress Notes (Signed)
Patient transferred via wheelchair to 5N12. VSS. Groin assessment stable.

## 2021-11-30 NOTE — Progress Notes (Signed)
NAME:  Paul Mitchell, MRN:  [id number], DOB:  16-Jan-1951, LOS: 1 ADMISSION DATE:  11/29/2021, CONSULTATION DATE:  11/29/2021 REFERRING MD:  ED, CHIEF COMPLAINT:  Acute Hypoxic Respirator Failure with Bilateral Saddle PE   History of Present Illness:  Paul Mitchell is a 71 year old male never smoker nondrinker who has a history of COVID in 2021 and required oxygen for several months afterwards along with anticoagulation for a pulmonary embolism.  He is off oxygen and off treatment for pulmonary embolism with anticoagulation and on 11/29/2021 he has had a fall with facial trauma loss of consciousness and admission to the emergency department at [facility].  CT scans of head through chest and abdomen revealed bilateral large saddle pulmonary embolism with no acute injury to head neck or abdomen.  He is requiring 15 L nonrebreather may have sats greater than 92% therefore pulmonary critical care was called to evaluate for possible IR intervention.  He will go to IR for further evaluation and treatment he has been placed on heparin as this can measure until interventional radiology needed performed lytics or thrombectomy.  Pertinent  Medical History   Past Medical History:  Diagnosis Date   Hypertension    Prior DVTs noted in 2006-2008, unprovoked and found in the investigation of LE edema. 2021 DVT was again seen in LLE. D/c of anticoagulation in 05/2021.   Significant Hospital Events: Including procedures, antibiotic start and stop dates in addition to other pertinent events   9/6 admitted after syncopal episode with GLF +head injury, CT head/neck negative, CTA chest showed saddle pe, underwent thrombectomy, heparin gtt, question of expanding hematoma over right femoral access, no signs of active bleeding 9/7 off supplemental O2, stable bruising around right femoral access, hemodynamically stable, transfer out of ICU  Interim History / Subjective:  Doing well this morning with no new or worsening  complaints. He breathes primarily through his mouth and his O2 was titrated from 7L HFNC to off without subjective dyspnea. He later was sating 86-88 without dyspnea so 2L [place] was added with stats 92+. No signs of expanding hematoma from right femoral access. No new or worsening vision deficits or pain with eye movement.  Objective   Blood pressure 93/65, pulse 81, temperature 97.9 F (36.6 C), temperature source Oral, resp. rate 14, height 6\' 2"  (1.88 m), weight 103.8 kg, SpO2 94 %. PAP: (34-54)/(21-35) 34/21      Intake/Output Summary (Last 24 hours) at 11/30/2021 0827 Last data filed at 11/30/2021 0600 Gross per 24 hour  Intake 2550.96 ml  Output 1100 ml  Net 1450.96 ml   Filed Weights   11/29/21 0611 11/30/21 0500  Weight: 103.4 kg 103.8 kg    Examination: General: Well appearing male in NAD HENT: superficial abrasions with dried blood and healing tissue lateral to the left eye, sutured laceration over left frontal region without discharge, swollen orbital tissue around left eye, able to open left eye 80%, subconjunctival hemorrhage in inferior lateral left eye, no pain on EOM, EOM intact, visual fields intact grossly with limitation of lid swelling on the left, PERRL Lungs: CTA bilaterally Cardiovascular: RRR Abdomen: Soft, non-tender, non-distended, BS present Extremities: 1+ pitting edema in bilateral LE, palpable radial and pedal pulses Neuro: Moves all 4 extremities well against gravity, AxOx3 Skin: Bruising around right femoral access point without signs of swelling or pain  Resolved Hospital Problem list   Acute hypoxic respiratory failure  Assessment & Plan:  Massive Pulmonary Embolism with Acute hypoxic respiratory failure and  syncope bedside echo revealing RV strain and RV septal flattening. S/p thrombectomy on 9/6 with a small post op hematoma around the right femoral access, no interval progression. Titrated off supplemental O2. He will need to be anticoagulated for life  since this is a second event - heparin gtt, may transition to oral  - transfer out of ICU   History of COVID 03/15/2021 that required oxygen for approximately 6 months. Monitor O2 saturations, goal of 90+ He is followed by the [facility] for his pulmonary   History of congestive heart failure with a EF of 40%.  History of paroxysmal atrial fibrillation Careful with fluid administration May need cardiology consult if he shows signs of acute HF exacerbation    Syncope resulting in facial trauma    Sutured per the emergency physician, stable Head CT neck CT negative Analgesics as needed Daily wound cleaning  Hypokalemia Monitor and replete as needed  Best Practice (right click and "Reselect all SmartList Selections" daily)   Diet/type: Regular consistency (see orders) DVT prophylaxis: systemic heparin GI prophylaxis: PPI Lines: N/A Foley:  N/A Code Status:  full code Last date of multidisciplinary goals of care discussion [9/7] Dispo- stable to transfer out of ICU  Labs   CBC: Recent Labs  Lab 11/29/21 0625 11/29/21 0640 11/30/21 0413  WBC 19.1*  --  12.0*  NEUTROABS 13.4*  --   --   HGB 14.1 13.9 11.4*  HCT 41.5 41.0 33.6*  MCV 88.1  --  89.4  PLT 158  --  137*    Basic Metabolic Panel: Recent Labs  Lab 11/29/21 0625 11/29/21 0640 11/29/21 0929 11/30/21 0413  NA 137 139 140 137  K 2.8* 3.0* 3.6 3.4*  CL 103 100 102 105  CO2 24  --  28 24  GLUCOSE 212* 216* 178* 114*  BUN 18 20 15 20   CREATININE 1.35* 1.20 1.28* 1.28*  CALCIUM 8.2*  --  8.0* 7.8*  MG 1.7  --   --  2.1   GFR: Estimated Creatinine Clearance: 70.9 mL/min (A) (by C-G formula based on SCr of 1.28 mg/dL (H)). Recent Labs  Lab 11/29/21 0625 11/29/21 0929 11/29/21 1510 11/30/21 0413  PROCALCITON  --  0.27  --   --   WBC 19.1*  --   --  12.0*  LATICACIDVEN  --  2.2* 2.9*  --     Liver Function Tests: Recent Labs  Lab 11/29/21 0625 11/30/21 0413  AST 21 25   ALT 15 16  ALKPHOS 84 60  BILITOT 1.0 0.7  PROT 6.1* 5.0*  ALBUMIN 3.3* 2.6*   No results for input(s): "LIPASE", "AMYLASE" in the last 168 hours. No results for input(s): "AMMONIA" in the last 168 hours.  ABG    Component Value Date/Time   HCO3 26.5 11/29/2021 0625   TCO2 25 11/29/2021 0640   O2SAT 85.1 11/29/2021 0625     Coagulation Profile: Recent Labs  Lab 11/29/21 2135  INR 1.4*    Cardiac Enzymes: Recent Labs  Lab 11/29/21 0625  CKTOTAL 241    HbA1C: Hgb A1c MFr Bld  Date/Time Value Ref Range Status  03/03/2020 01:04 PM 6.4 (H) 4.8 - 5.6 % Final    Comment:    (NOTE) Pre diabetes:          5.7%-6.4%  Diabetes:              >6.4%  Glycemic control for   <7.0% adults with diabetes  CBG: Recent Labs  Lab 11/29/21 0606 11/29/21 1554  GLUCAP 198* 161*    Review of Systems:   No dyspnea or pain.  Past Medical History:  He,  has a past medical history of Hypertension.   Surgical History:   Past Surgical History:  Procedure Laterality Date   IR ANGIOGRAM PULMONARY BILATERAL SELECTIVE  11/29/2021   IR ANGIOGRAM SELECTIVE EACH ADDITIONAL VESSEL  11/29/2021   IR ANGIOGRAM SELECTIVE EACH ADDITIONAL VESSEL  11/29/2021   IR THROMBECT PRIM MECH INIT (INCLU) MOD SED  11/29/2021   IR THROMBECT PRIM MECH INIT (INCLU) MOD SED  11/29/2021   IR US GUIDE VASC ACCESS RIGHT  11/29/2021     Social History:   reports that he has never smoked. He has never used smokeless tobacco. He reports that he does not currently use alcohol. He reports that he does not currently use drugs.   Family History:  His family history is not on file.   Allergies Allergies  Allergen Reactions   Enalapril Cough     Home Medications  Prior to Admission medications   Medication Sig Start Date End Date Taking? Authorizing Provider  albuterol (VENTOLIN HFA) 108 (90 Base) MCG/ACT inhaler Inhale 2 puffs into the lungs every 6 (six) hours as needed for wheezing or shortness of breath.  02/29/20  Yes Sreenath, Sudheer B, MD  aspirin EC 81 MG tablet Take 81 mg by mouth daily.   Yes [provider]  furosemide (LASIX) 20 MG tablet Take 20 mg by mouth daily. 10/23/21  Yes [provider]  hydrochlorothiazide (MICROZIDE) 12.5 MG capsule Take 12.5 mg by mouth daily. 10/23/21  Yes [provider]  amiodarone (PACERONE) 200 MG tablet Take 1 tablet (200 mg total) by mouth 2 (two) times daily. Patient not taking: Reported on 10/13/2020 03/10/20   Marrion Coy, MD  apixaban (ELIQUIS) 2.5 MG TABS tablet Take 1 tablet (2.5 mg total) by mouth 2 (two) times daily. Patient not taking: Reported on 11/29/2021 10/16/20   Schnier, Latina Craver, MD  guaiFENesin-dextromethorphan (ROBITUSSIN DM) 100-10 MG/5ML syrup Take 10 mLs by mouth every 4 (four) hours as needed for cough. Patient not taking: Reported on 10/13/2020 02/29/20   Tresa Moore, MD     Critical care time: 72

## 2021-11-30 NOTE — Progress Notes (Signed)
ANTICOAGULATION CONSULT NOTE   Pharmacy Consult for heparin  Indication: pulmonary embolus  Allergies  Allergen Reactions   Enalapril Cough    Patient Measurements: Height: 6\' 2"  (188 cm) Weight: 103.8 kg (228 lb 13.4 oz) IBW/kg (Calculated) : 82.2 Heparin Dosing Weight: 103kg   Vital Signs: Temp: 97.9 F (36.6 C) (09/07 0802) Temp Source: Oral (09/07 0802) BP: 110/70 (09/07 0900) Pulse Rate: 89 (09/07 0920)  Labs: Recent Labs    11/29/21 01/29/22 11/29/21 0640 11/29/21 01/29/22 11/29/21 0929 11/29/21 1510 11/29/21 2135 11/30/21 0413 11/30/21 0926  HGB 14.1 13.9  --   --   --   --  11.4*  --   HCT 41.5 41.0  --   --   --   --  33.6*  --   PLT 158  --   --   --   --   --  137*  --   APTT  --   --   --   --   --  129*  --   --   LABPROT  --   --   --   --   --  17.3*  --   --   INR  --   --   --   --   --  1.4*  --   --   HEPARINUNFRC  --   --   --   --  >1.10*  --   --  0.28*  HEPRLOWMOCWT  --   --   --   --   --  0.45  --   --   CREATININE 1.35* 1.20  --  1.28*  --   --  1.28*  --   CKTOTAL 241  --   --   --   --   --   --   --   TROPONINIHS 140*  --  984*  --   --   --   --   --      Estimated Creatinine Clearance: 70.9 mL/min (A) (by C-G formula based on SCr of 1.28 mg/dL (H)).   Medical History: Past Medical History:  Diagnosis Date   Hypertension    Assessment: 71 y.o M with history of PE/DVT not on anticoagulation PTA admitted for a chief complaint of syncope. CTA + for acute PE with evidence of heart strain consistent with submassive PE. Also, noted with a history of Afib.  Patient has bruising around right groin femoral site s/p IR treatment of PE - not noted to be worsening this AM. Per discussion with RN, some concern for potential bleeding during removal of woggle suture today and will monitor closely.   Heparin level 0.28 (slightly subtherapeutic). Noted CBC dropped a little bit - Hgb 13.9 > 11.4; PLT 158 >137. No issues with infusion per RN.  Goal of  Therapy:  Heparin level 0.3-0.7 units/ml Monitor platelets by anticoagulation protocol: Yes   Plan:  Increase heparin infusion to 1600 units/hr Check heparin level in 6 hours and daily while on heparin Continue to monitor H&H and platelets  75, PharmD Pharmacy Resident  11/30/2021 10:38 AM

## 2021-11-30 NOTE — Progress Notes (Signed)
PCCM Interval Progress Note  Asked by E-Link to evaluate patient at bedside for concern for expanding right groin hematoma.  Evaluated patient at bedside.  Patient was not reporting any pain at the time of my evaluation.  Right groin was soft with increased bruising around catheter.  I removed patient's dressing to assess for any tracking/induration, there was no major induration or firmness.  I also evaluated hematoma site with bedside ultrasound with findings showing diffuse hematoma, no large vessel-adjacent pockets were appreciated.  Dressing was replaced with plan for close monitoring.  Saline bag was placed for compression.  Patient's vitals remained stable throughout the evening.  Tim Lair, PA-C [facility] 11/30/21 7:04 AM  Please see [email or web address] for pager details.  From 7A-7P if no response, please call [phone] After hours, please call ELink [phone]

## 2021-12-01 DIAGNOSIS — Z86711 Personal history of pulmonary embolism: Secondary | ICD-10-CM

## 2021-12-01 DIAGNOSIS — I2692 Saddle embolus of pulmonary artery without acute cor pulmonale: Principal | ICD-10-CM

## 2021-12-01 DIAGNOSIS — E876 Hypokalemia: Secondary | ICD-10-CM

## 2021-12-01 DIAGNOSIS — D72829 Elevated white blood cell count, unspecified: Secondary | ICD-10-CM

## 2021-12-01 LAB — CBC
HCT: 33.6 % — ABNORMAL LOW (ref 39.0–52.0)
Hemoglobin: 11.3 g/dL — ABNORMAL LOW (ref 13.0–17.0)
MCH: 30.5 pg (ref 26.0–34.0)
MCHC: 33.6 g/dL (ref 30.0–36.0)
MCV: 90.8 fL (ref 80.0–100.0)
Platelets: 163 10*3/uL (ref 150–400)
RBC: 3.7 MIL/uL — ABNORMAL LOW (ref 4.22–5.81)
RDW: 13.9 % (ref 11.5–15.5)
WBC: 11.5 10*3/uL — ABNORMAL HIGH (ref 4.0–10.5)
nRBC: 0 % (ref 0.0–0.2)

## 2021-12-01 LAB — POCT ACTIVATED CLOTTING TIME
Activated Clotting Time: 185 seconds
Activated Clotting Time: 263 seconds

## 2021-12-01 LAB — COMPREHENSIVE METABOLIC PANEL
ALT: 15 U/L (ref 0–44)
AST: 24 U/L (ref 15–41)
Albumin: 2.9 g/dL — ABNORMAL LOW (ref 3.5–5.0)
Alkaline Phosphatase: 58 U/L (ref 38–126)
Anion gap: 7 (ref 5–15)
BUN: 20 mg/dL (ref 8–23)
CO2: 21 mmol/L — ABNORMAL LOW (ref 22–32)
Calcium: 8.2 mg/dL — ABNORMAL LOW (ref 8.9–10.3)
Chloride: 112 mmol/L — ABNORMAL HIGH (ref 98–111)
Creatinine, Ser: 1.22 mg/dL (ref 0.61–1.24)
GFR, Estimated: >60 mL/min (ref >60)
Glucose, Bld: 124 mg/dL — ABNORMAL HIGH (ref 70–99)
Potassium: 4.5 mmol/L (ref 3.5–5.1)
Sodium: 140 mmol/L (ref 135–145)
Total Bilirubin: 0.4 mg/dL (ref 0.3–1.2)
Total Protein: 5.4 g/dL — ABNORMAL LOW (ref 6.5–8.1)

## 2021-12-01 LAB — HEPARIN LEVEL (UNFRACTIONATED)
Heparin Unfractionated: 0.58 IU/mL (ref 0.30–0.70)
Heparin Unfractionated: 0.34 IU/mL (ref 0.30–0.70)

## 2021-12-01 LAB — PHOSPHORUS: Phosphorus: 2.3 mg/dL — ABNORMAL LOW (ref 2.5–4.6)

## 2021-12-01 LAB — MAGNESIUM: Magnesium: 2 mg/dL (ref 1.7–2.4)

## 2021-12-01 MED ORDER — K PHOS MONO-SOD PHOS DI & MONO 155-852-130 MG PO TABS
500.0000 mg | ORAL_TABLET | Freq: Once | ORAL | Status: AC
  Administered 2021-12-01: 500 mg via ORAL
  Filled 2021-12-01: qty 2

## 2021-12-01 NOTE — Progress Notes (Signed)
Mobility Specialist Progress Note   12/01/21 1144  Mobility  Activity Ambulated with assistance in hallway  Level of Assistance Contact guard assist, steadying assist  Assistive Device None  Distance Ambulated (ft) 225 ft  Activity Response Tolerated well  $Mobility charge 1 Mobility   Pre Mobility: 89 HR, 127/84 BP, 100% SpO2 on 2LO2 During Mobility: HR, BP, 86% - 92% SpO2 on RA - 2LO2 Post Mobility: HR, 148/75 BP, 95% SpO2 on 2LO2  Received in bed on 2LO2 w/ no complaints and agreeable to mobility. X1 standing rest breaks to recover SpO2 levels floating around 86% while on RA, pt having no complaints but slight dyspnea. On 2LO2 (per advisement of RN), pt able to maintain SpO2 of ~93% for remainder of ambulation. Returned back to bed where pt was placed back on wall O2 and left w/ call bell in reach.   Frederico Hamman Mobility Specialist MS [facility] #:  [phone] Acute Rehab Office:  [phone]

## 2021-12-01 NOTE — Progress Notes (Signed)
SATURATION QUALIFICATIONS: (This note is used to comply with regulatory documentation for home oxygen)  Patient Saturations on Room Air at Rest = 96%  Patient Saturations on Room Air while Ambulating = 86%  Patient Saturations on 2 Liters of oxygen while Ambulating = 92%  Please briefly explain why patient needs home oxygen: N/A

## 2021-12-01 NOTE — Progress Notes (Signed)
ANTICOAGULATION CONSULT NOTE   Pharmacy Consult for heparin  Indication: pulmonary embolus  Allergies  Allergen Reactions   Enalapril Cough    Patient Measurements: Height: 6\' 2"  (188 cm) Weight: 103.8 kg (228 lb 13.4 oz) IBW/kg (Calculated) : 82.2 Heparin Dosing Weight: 103kg   Vital Signs: Temp: 98.3 F (36.8 C) (09/08 0819) Temp Source: Oral (09/08 0819) BP: 127/85 (09/08 0819) Pulse Rate: 87 (09/08 0819)  Labs: Recent Labs    11/29/21 0625 11/29/21 0640 11/29/21 01/29/22 11/29/21 0929 11/29/21 1510 11/29/21 2135 11/30/21 0413 11/30/21 0926 11/30/21 1822 12/01/21 0809  HGB 14.1 13.9  --   --   --   --  11.4*  --   --  11.3*  HCT 41.5 41.0  --   --   --   --  33.6*  --   --  33.6*  PLT 158  --   --   --   --   --  137*  --   --  163  APTT  --   --   --   --   --  129*  --   --   --   --   LABPROT  --   --   --   --   --  17.3*  --   --   --   --   INR  --   --   --   --   --  1.4*  --   --   --   --   HEPARINUNFRC  --   --   --   --    < >  --   --  0.28* 0.10* 0.58  HEPRLOWMOCWT  --   --   --   --   --  0.45  --   --   --   --   CREATININE 1.35* 1.20  --  1.28*  --   --  1.28*  --   --   --   CKTOTAL 241  --   --   --   --   --   --   --   --   --   TROPONINIHS 140*  --  984*  --   --   --   --   --   --   --    < > = values in this interval not displayed.     Estimated Creatinine Clearance: 70.9 mL/min (A) (by C-G formula based on SCr of 1.28 mg/dL (H)).   Medical History: Past Medical History:  Diagnosis Date   Hypertension    Assessment: 71 y.o M with history of PE/DVT not on anticoagulation PTA admitted for a chief complaint of syncope. CTA + for acute PE with evidence of heart strain consistent with submassive PE. Also, noted with a history of Afib.  Heparin level 0.58 (therapeutic). No issues with infusion per RN.  Goal of Therapy:  Heparin level 0.3-0.7 units/ml Monitor platelets by anticoagulation protocol: Yes   Plan:  Continue heparin  infusion at 1900 units/hr Check confirmatory heparin level in 6 hours and daily while on heparin Continue to monitor H&H and platelets  Talayla Doyel A. 75, PharmD, BCPS, FNKF Clinical Pharmacist [place] Please utilize Amion for appropriate phone number to reach the unit pharmacist [facility])  12/01/2021, 9:22 AM

## 2021-12-01 NOTE — Progress Notes (Signed)
ANTICOAGULATION CONSULT NOTE   Pharmacy Consult for heparin  Indication: pulmonary embolus  Allergies  Allergen Reactions   Enalapril Cough    Patient Measurements: Height: 6\' 2"  (188 cm) Weight: 103.8 kg (228 lb 13.4 oz) IBW/kg (Calculated) : 82.2 Heparin Dosing Weight: 103kg   Vital Signs: Temp: 98.3 F (36.8 C) (09/08 0819) Temp Source: Oral (09/08 0819) BP: 127/85 (09/08 0819) Pulse Rate: 87 (09/08 0819)  Labs: Recent Labs    11/29/21 0625 11/29/21 0640 11/29/21 01/29/22 11/29/21 0929 11/29/21 1510 11/29/21 2135 11/30/21 0413 11/30/21 0926 11/30/21 1822 12/01/21 0809 12/01/21 1425  HGB 14.1 13.9  --   --   --   --  11.4*  --   --  11.3*  --   HCT 41.5 41.0  --   --   --   --  33.6*  --   --  33.6*  --   PLT 158  --   --   --   --   --  137*  --   --  163  --   APTT  --   --   --   --   --  129*  --   --   --   --   --   LABPROT  --   --   --   --   --  17.3*  --   --   --   --   --   INR  --   --   --   --   --  1.4*  --   --   --   --   --   HEPARINUNFRC  --   --   --   --    < >  --   --    < > 0.10* 0.58 0.34  HEPRLOWMOCWT  --   --   --   --   --  0.45  --   --   --   --   --   CREATININE 1.35* 1.20  --  1.28*  --   --  1.28*  --   --  1.22  --   CKTOTAL 241  --   --   --   --   --   --   --   --   --   --   TROPONINIHS 140*  --  984*  --   --   --   --   --   --   --   --    < > = values in this interval not displayed.     Estimated Creatinine Clearance: 74.4 mL/min (by C-G formula based on SCr of 1.22 mg/dL).   Medical History: Past Medical History:  Diagnosis Date   Hypertension    Assessment: 71 y.o M with history of PE/DVT not on anticoagulation PTA admitted for a chief complaint of syncope. CTA + for acute PE with evidence of heart strain consistent with submassive PE. Also, noted with a history of Afib.  Heparin level 0.58 (therapeutic). No issues with infusion per RN.  Confirmation heparin therapeutic again this PM.  Goal of Therapy:   Heparin level 0.3-0.7 units/ml Monitor platelets by anticoagulation protocol: Yes   Plan:  Continue heparin infusion at 1900 units/hr Heparin level daily while on heparin Continue to monitor H&H and platelets  75, PharmD, BCIDP, AAHIVP, CPP Infectious Disease Pharmacist 12/01/2021 4:11 PM

## 2021-12-01 NOTE — Progress Notes (Signed)
PROGRESS NOTE    Paul Mitchell  [id number] DOB: 1950/08/30 DOA: 11/29/2021 PCP: Sofie Hartigan, MD   Brief Narrative:  The patient is a 71 year old overweight Caucasian male with a past medical history significant for #2 hypertension and a history of COVID in 2021 he required oxygen for several months afterwards along with anticoagulation for pulmonary embolus.  He was off of oxygen off of treatment for pulmonary embolism for anticoagulation and on 11/29/2021 he had a fall with facial trauma losing consciousness admission to the ED.  CT scan of the head through chest and abdomen revealed a bilateral large saddle pulmonary embolus with no acute injury to the head or neck or abdomen.  Initially he required 15 L nonrebreather to have his sats greater than 92% and pulmonary critical care was consulted and IR was consulted for further evaluation and treatment.  He was placed on IV heparin and interventional radiology took the patient for mechanical thrombectomy.  Hemoglobin dropped afterwards and he developed a slight hematoma.  He is improving and oxygen is weaning and he is on 2 L supplemental oxygen via nasal cannula however he desaturated on room air.  We expect weaning further and will obtain PT and OT to further evaluate and treat.  He remains on a heparin drip and pharmacy is managing.  We will need to transition to oral anticoagulation at some point.  Current significant hospital events and hospital course showed: Significant Hospital Events: Including procedures, antibiotic start and stop dates in addition to other pertinent events   9/6 admitted after syncopal episode with GLF +head injury, CT head/neck negative, CTA chest showed saddle pe, underwent thrombectomy, heparin gtt, question of expanding hematoma over right femoral access, no signs of active bleeding 9/7 off supplemental O2, stable bruising around right femoral access, hemodynamically stable, transfer out of ICU  He was doing  well yesterday morning and he is titrated down from 7 L high flow nasal cannula to 2 L and was transferred to the [facility] service on 12/01/2021  Assessment and Plan:  Massive Pulmonary Embolism with Acute hypoxic respiratory failure and syncope -Bedside echo revealing RV strain and RV septal flattening. -S/p thrombectomy on 9/6 with a small post op hematoma around the right femoral access, no interval progression. -Titrated down on Supplemental O2. He will need to be anticoagulated for life since this is a second event - heparin gtt, may transition to oral in the a.m. -SpO2: 100 % O2 Flow Rate (L/min): 2 L/min -Will need Ambulatory Home O2 Screen prior to D/C -Transferred out of ICU to Medical Telemetry and [place] assumed Attending 12/01/21 -WBC went from 12.0 -> 11.5 -Follow-up PT OT evaluations   History of COVID 03/15/2021 that required oxygen for approximately 6 months. -Monitor O2 saturations, goal of 90+ -SpO2: 100 % O2 Flow Rate (L/min): 2 L/min -He is followed by the [facility] for his pulmonary   History of congestive heart failure with a EF of 40%. History of paroxysmal atrial fibrillation -Careful with fluid administration -May need Cardiology consult if he shows signs of acute HF exacerbation    Syncope resulting in facial trauma -We will need PT OT to evaluate and treat -His head and neck CT was negative -Continue with analgesics and daily wound cleaning -He was sutured in the ED -Continue to monitor on telemetry and echocardiogram was done  Normocytic Anemia -Patient's hemoglobin/hematocrit went from 11.4/33.6 and is now 11.3/33.6 -Check anemia panel in the a.m. -Continue monitor for signs and  symptoms of bleeding as he is on a heparin drip -Repeat CBC in a.m.  Hypokalemia -Improved and potassium went from 3.4 is now 4.5 -Continue monitor and replete as necessary -Repeat CMP in a.m.  Hypophosphatemia -Patient's Phos level is now 2.3 -We will replete  with p.o. K-Phos Neutral 500 mg x 1 -Repeat Phos level in a.m.  GERD/GI prophylaxis -Continue with pantoprazole 40 mill p.o. daily  DVT prophylaxis: SCDs Start: 11/29/21 0854    Code Status: Full Code Family Communication: No family currently at bedside  Disposition Plan:  Level of care: Telemetry Medical Status is: Inpatient Remains inpatient appropriate because: Remains on a heparin drip and will need to try and attempt to wean him further.  Will need PT OT to further evaluate and treat   Consultants:  PCCM transfer Interventional radiology  Procedures:  Procedure:    US guided right CFV access PA angiogram, pressure measurement Aspiration/mechanical thrombectomy of PE, bilateral.    Findings: Initial PA pressure: 54/35 (42) After left treatment: 42/30 (35) After left & right treatment = final: 34/21 (26)  Antimicrobials:  Anti-infectives (From admission, onward)    None       Subjective: Seen and examined at bedside and he thinks he is doing little bit better.  Continues to be a little short of breath.  No nausea or vomiting.  Had some facial trauma from when he hit the door and fell out of his truck.  No other concerns or complaints this time.  Objective: Vitals:   11/30/21 1517 11/30/21 1815 11/30/21 2137 12/01/21 0819  BP:  128/83 105/60 127/85  Pulse:  92 92 87  Resp:   19 (!) 22  Temp: 97.7 F (36.5 C) 98.2 F (36.8 C) 97.7 F (36.5 C) 98.3 F (36.8 C)  TempSrc: Oral Oral Oral Oral  SpO2:  98% 91% 100%  Weight:      Height:        Intake/Output Summary (Last 24 hours) at 12/01/2021 1622 Last data filed at 12/01/2021 1300 Gross per 24 hour  Intake 1026.75 ml  Output 500 ml  Net 526.75 ml   Filed Weights   11/29/21 0611 11/30/21 0500  Weight: 103.4 kg 103.8 kg   Examination: Physical Exam:  Constitutional: WN/WD overweight Caucasian male with facial trauma and sutures on his face noted Respiratory: Diminished to auscultation bilaterally  with coarse breath sounds, no wheezing, rales, rhonchi or crackles. Normal respiratory effort and patient is not tachypenic. No accessory muscle use.  Unlabored breathing but is wearing supplemental oxygen nasal cannula Cardiovascular: RRR, no murmurs / rubs / gallops. S1 and S2 auscultated.  Some mild lower extremity edema noted and some bruising in his right thigh Abdomen: Soft, non-tender, distal secondary body habitus.  Bowel sounds positive.  GU: Deferred. Musculoskeletal: No clubbing / cyanosis of digits/nails. No joint deformity upper and lower extremities.  Skin: Facial trauma noted with sutures in place and some slight bloody drainage as well as a bruise and ecchymosis in the right groin Neurologic: CN 2-12 grossly intact with no focal deficits. Romberg sign and cerebellar reflexes not assessed.  Psychiatric: Normal judgment and insight. Alert and oriented x 3. Normal mood and appropriate affect.   Data Reviewed: I have personally reviewed following labs and imaging studies  CBC: Recent Labs  Lab 11/29/21 0625 11/29/21 0640 11/30/21 0413 12/01/21 0809  WBC 19.1*  --  12.0* 11.5*  NEUTROABS 13.4*  --   --   --   HGB 14.1 13.9 11.4*  11.3*  HCT 41.5 41.0 33.6* 33.6*  MCV 88.1  --  89.4 90.8  PLT 158  --  137* [id number]   Basic Metabolic Panel: Recent Labs  Lab 11/29/21 0625 11/29/21 0640 11/29/21 0929 11/30/21 0413 12/01/21 0809  NA 137 139 140 137 140  K 2.8* 3.0* 3.6 3.4* 4.5  CL 103 100 102 105 112*  CO2 24  --  28 24 21*  GLUCOSE 212* 216* 178* 114* 124*  BUN 18 20 15 20 20   CREATININE 1.35* 1.20 1.28* 1.28* 1.22  CALCIUM 8.2*  --  8.0* 7.8* 8.2*  MG 1.7  --   --  2.1 2.0  PHOS  --   --   --   --  2.3*   GFR: Estimated Creatinine Clearance: 74.4 mL/min (by C-G formula based on SCr of 1.22 mg/dL). Liver Function Tests: Recent Labs  Lab 11/29/21 0625 11/30/21 0413 12/01/21 0809  AST 21 25 24   ALT 15 16 15   ALKPHOS 84 60 58  BILITOT 1.0 0.7 0.4  PROT 6.1* 5.0*  5.4*  ALBUMIN 3.3* 2.6* 2.9*   No results for input(s): "LIPASE", "AMYLASE" in the last 168 hours. No results for input(s): "AMMONIA" in the last 168 hours. Coagulation Profile: Recent Labs  Lab 11/29/21 2135  INR 1.4*   Cardiac Enzymes: Recent Labs  Lab 11/29/21 0625  CKTOTAL 241   BNP (last 3 results) No results for input(s): "PROBNP" in the last 8760 hours. HbA1C: No results for input(s): "HGBA1C" in the last 72 hours. CBG: Recent Labs  Lab 11/29/21 0606 11/29/21 1554  GLUCAP 198* 161*   Lipid Profile: No results for input(s): "CHOL", "HDL", "LDLCALC", "TRIG", "CHOLHDL", "LDLDIRECT" in the last 72 hours. Thyroid Function Tests: No results for input(s): "TSH", "T4TOTAL", "FREET4", "T3FREE", "THYROIDAB" in the last 72 hours. Anemia Panel: No results for input(s): "VITAMINB12", "FOLATE", "FERRITIN", "TIBC", "IRON", "RETICCTPCT" in the last 72 hours. Sepsis Labs: Recent Labs  Lab 11/29/21 0929 11/29/21 1510  PROCALCITON 0.27  --   LATICACIDVEN 2.2* 2.9*    Recent Results (from the past 240 hour(s))  Resp Panel by RT-PCR (Flu A&B, Covid) Anterior Nasal Swab     Status: None   Collection Time: 11/29/21  7:35 AM   Specimen: Anterior Nasal Swab  Result Value Ref Range Status   SARS Coronavirus 2 by RT PCR NEGATIVE NEGATIVE Final    Comment: (NOTE) SARS-CoV-2 target nucleic acids are NOT DETECTED.  The SARS-CoV-2 RNA is generally detectable in upper respiratory specimens during the acute phase of infection. The lowest concentration of SARS-CoV-2 viral copies this assay can detect is 138 copies/mL. A negative result does not preclude SARS-Cov-2 infection and should not be used as the sole basis for treatment or other patient management decisions. A negative result may occur with  improper specimen collection/handling, submission of specimen other than nasopharyngeal swab, presence of viral mutation(s) within the areas targeted by this assay, and inadequate number  of viral copies(<138 copies/mL). A negative result must be combined with clinical observations, patient history, and epidemiological information. The expected result is Negative.  Fact Sheet for Patients:  EntrepreneurPulse.com.au  Fact Sheet for Healthcare Providers:  IncredibleEmployment.be  This test is no t yet approved or cleared by the [place] FDA and  has been authorized for detection and/or diagnosis of SARS-CoV-2 by FDA under an Emergency Use Authorization (EUA). This EUA will remain  in effect (meaning this test can be used) for the duration of the COVID-19 declaration under Section  564(b)(1) of the Act, 21 U.S.C.section 360bbb-3(b)(1), unless the authorization is terminated  or revoked sooner.       Influenza A by PCR NEGATIVE NEGATIVE Final   Influenza B by PCR NEGATIVE NEGATIVE Final    Comment: (NOTE) The Xpert Xpress SARS-CoV-2/FLU/RSV plus assay is intended as an aid in the diagnosis of influenza from Nasopharyngeal swab specimens and should not be used as a sole basis for treatment. Nasal washings and aspirates are unacceptable for Xpert Xpress SARS-CoV-2/FLU/RSV testing.  Fact Sheet for Patients: EntrepreneurPulse.com.au  Fact Sheet for Healthcare Providers: IncredibleEmployment.be  This test is not yet approved or cleared by the [place] FDA and has been authorized for detection and/or diagnosis of SARS-CoV-2 by FDA under an Emergency Use Authorization (EUA). This EUA will remain in effect (meaning this test can be used) for the duration of the COVID-19 declaration under Section 564(b)(1) of the Act, 21 U.S.C. section 360bbb-3(b)(1), unless the authorization is terminated or revoked.  Performed at [facility], [place] [address] [place], [place] [address]   MRSA Next Gen by PCR, Nasal     Status: None   Collection Time: 11/29/21  1:41 PM   Specimen: Nasal Mucosa;  Nasal Swab  Result Value Ref Range Status   MRSA by PCR Next Gen NOT DETECTED NOT DETECTED Final    Comment: (NOTE) The GeneXpert MRSA Assay (FDA approved for NASAL specimens only), is one component of a comprehensive MRSA colonization surveillance program. It is not intended to diagnose MRSA infection nor to guide or monitor treatment for MRSA infections. Test performance is not FDA approved in patients less than 20 years old. Performed at [facility], [place] [address], [place], [place] [address]     Radiology Studies: ECHOCARDIOGRAM COMPLETE  Result Date: 11/30/2021    ECHOCARDIOGRAM REPORT   Patient Name:   KHOLE CANIZARES Date of Exam: 11/30/2021 Medical Rec #:  [id number]       Height:       74.0 in Accession #:    [id number]      Weight:       228.8 lb Date of Birth:  10-25-1950      BSA:          2.302 m Patient Age:    36 years        BP:           93/65 mmHg Patient Gender: M               HR:           87 bpm. Exam Location:  Inpatient Procedure: 2D Echo, Color Doppler and Cardiac Doppler Indications:    Pulmonary Embolus  History:        Patient has prior history of Echocardiogram examinations, most                 recent 03/04/2020. Risk Factors:Hypertension.  Sonographer:    Memory Argue Referring Phys: [place]  1. Mcconnell's sign noted.  2. Left ventricular ejection fraction, by estimation, is 65 to 70%. The left ventricle has normal function. The left ventricle has no regional wall motion abnormalities. There is mild left ventricular hypertrophy. Left ventricular diastolic parameters are consistent with Grade I diastolic dysfunction (impaired relaxation).  3. Right ventricular systolic function is moderately reduced. The right ventricular size is normal. There is moderately elevated pulmonary artery systolic pressure.  4. The mitral valve is normal in structure. No evidence of  mitral valve regurgitation. No evidence of mitral stenosis.  5. The aortic valve  is tricuspid. Aortic valve regurgitation is not visualized. Aortic valve sclerosis/calcification is present, without any evidence of aortic stenosis.  6. Aortic dilatation noted. There is mild dilatation of the aortic root, measuring 44 mm.  7. The inferior vena cava is dilated in size with >50% respiratory variability, suggesting right atrial pressure of 8 mmHg. FINDINGS  Left Ventricle: Left ventricular ejection fraction, by estimation, is 65 to 70%. The left ventricle has normal function. The left ventricle has no regional wall motion abnormalities. The left ventricular internal cavity size was normal in size. There is  mild left ventricular hypertrophy. Left ventricular diastolic parameters are consistent with Grade I diastolic dysfunction (impaired relaxation). Right Ventricle: The right ventricular size is normal. Right ventricular systolic function is moderately reduced. There is moderately elevated pulmonary artery systolic pressure. The tricuspid regurgitant velocity is 3.15 m/s, and with an assumed right atrial pressure of 8 mmHg, the estimated right ventricular systolic pressure is [id number] mmHg. Left Atrium: Left atrial size was normal in size. Right Atrium: Right atrial size was normal in size. Pericardium: Trivial pericardial effusion is present. Mitral Valve: The mitral valve is normal in structure. No evidence of mitral valve regurgitation. No evidence of mitral valve stenosis. Tricuspid Valve: The tricuspid valve is normal in structure. Tricuspid valve regurgitation is mild . No evidence of tricuspid stenosis. Aortic Valve: The aortic valve is tricuspid. Aortic valve regurgitation is not visualized. Aortic valve sclerosis/calcification is present, without any evidence of aortic stenosis. Aortic valve mean gradient measures 4.0 mmHg. Aortic valve peak gradient measures 7.0 mmHg. Aortic valve area, by VTI measures 4.57 cm. Pulmonic Valve: The pulmonic valve was normal in structure. Pulmonic valve  regurgitation is not visualized. No evidence of pulmonic stenosis. Aorta: Aortic dilatation noted. There is mild dilatation of the aortic root, measuring 44 mm. Venous: The inferior vena cava is dilated in size with greater than 50% respiratory variability, suggesting right atrial pressure of 8 mmHg. IAS/Shunts: No atrial level shunt detected by color flow Doppler. Additional Comments: Mcconnell's sign noted.  LEFT VENTRICLE PLAX 2D LVIDd:         4.80 cm   Diastology LVIDs:         2.70 cm   LV e' medial:    9.36 cm/s LV PW:         1.10 cm   LV E/e' medial:  6.5 LV IVS:        1.20 cm   LV e' lateral:   7.40 cm/s LVOT diam:     2.60 cm   LV E/e' lateral: 8.3 LV SV:         105 LV SV Index:   45 LVOT Area:     5.31 cm  RIGHT VENTRICLE TAPSE (M-mode): 2.0 cm LEFT ATRIUM             Index        RIGHT ATRIUM           Index LA diam:        3.10 cm 1.35 cm/m   RA Area:     18.43 cm LA Vol (A2C):   69.9 ml 30.37 ml/m  RA Volume:   45.97 ml  19.97 ml/m LA Vol (A4C):   44.9 ml 19.51 ml/m LA Biplane Vol: 56.7 ml 24.64 ml/m  AORTIC VALVE AV Area (Vmax):    5.07 cm AV Area (Vmean):   4.65 cm AV Area (  VTI):     4.57 cm AV Vmax:           132.00 cm/s AV Vmean:          90.900 cm/s AV VTI:            0.229 m AV Peak Grad:      7.0 mmHg AV Mean Grad:      4.0 mmHg LVOT Vmax:         126.00 cm/s LVOT Vmean:        79.600 cm/s LVOT VTI:          0.197 m LVOT/AV VTI ratio: 0.86  AORTA Ao Root diam: 4.40 cm Ao Asc diam:  3.50 cm MITRAL VALVE               TRICUSPID VALVE MV Area (PHT): 4.21 cm    TR Peak grad:   39.7 mmHg MV Decel Time: 180 msec    TR Vmax:        315.00 cm/s MV E velocity: 61.10 cm/s MV A velocity: 71.30 cm/s  SHUNTS MV E/A ratio:  0.86        Systemic VTI:  0.20 m                            Systemic Diam: 2.60 cm Olga Millers MD Electronically signed by Olga Millers MD Signature Date/Time: 11/30/2021/12:14:22 PM    Final     Scheduled Meds:  Chlorhexidine Gluconate Cloth  6 each Topical Daily    pantoprazole  40 mg Oral Daily   Continuous Infusions:  heparin 1,900 Units/hr (12/01/21 0946)    LOS: 2 days   Marguerita Merles, DO [facility] Available via Epic secure chat 7am-7pm After these hours, please refer to coverage provider listed on [email or web address] 12/01/2021, 4:22 PM

## 2021-12-01 NOTE — Progress Notes (Signed)
Referring Physician(s): Dr. Cheri Fowler  Supervising Physician: Mir, Mauri Reading  Patient Status:  [facility] - In-pt  Chief Complaint:  Pulmonary embolism  Brief History:  Paul Mitchell is a 71 y.o. male with past h/o DVT on anticoagulation.  His anticoagulatin was stopped in April 2022 by his Vascular surgeon, Dr. Gilda Crease.   On the morning of 11/29/21, he was climbing into his semi-truck when he experienced syncopal episode and fell to the ground.    On arrival to the ED, O2 sats were 78% and required NRB.   He has considerable injuries to the right face with a large laceration which has been sutured.  Large area of swelling and bruising under along the right cheek bone. CT Head negative.   CTA Chest showed bilateral acute arterial emboli with large clot burden and right heart strain.   He underwent PE thrombectomy by Dr. Loreta Ave on 11/29/21.  CFV access was left overnight and removed yesterday.  RN called concerned about extensive ecchymosis of the right groin.   Subjective:  C/o the eschar on the left cheek being itchy. He wants his wife to "come pick it off".  Allergies: Enalapril  Medications: Prior to Admission medications   Medication Sig Start Date End Date Taking? Authorizing Provider  albuterol (VENTOLIN HFA) 108 (90 Base) MCG/ACT inhaler Inhale 2 puffs into the lungs every 6 (six) hours as needed for wheezing or shortness of breath. 02/29/20  Yes Sreenath, Sudheer B, MD  aspirin EC 81 MG tablet Take 81 mg by mouth daily.   Yes [provider]  furosemide (LASIX) 20 MG tablet Take 20 mg by mouth daily. 10/23/21  Yes [provider]  hydrochlorothiazide (MICROZIDE) 12.5 MG capsule Take 12.5 mg by mouth daily. 10/23/21  Yes [provider]  amiodarone (PACERONE) 200 MG tablet Take 1 tablet (200 mg total) by mouth 2 (two) times daily. Patient not taking: Reported on 10/13/2020 03/10/20   Marrion Coy, MD  apixaban (ELIQUIS) 2.5 MG TABS tablet  Take 1 tablet (2.5 mg total) by mouth 2 (two) times daily. Patient not taking: Reported on 11/29/2021 10/16/20   Schnier, Latina Craver, MD  guaiFENesin-dextromethorphan (ROBITUSSIN DM) 100-10 MG/5ML syrup Take 10 mLs by mouth every 4 (four) hours as needed for cough. Patient not taking: Reported on 10/13/2020 02/29/20   Tresa Moore, MD     Vital Signs: BP 127/85 (BP Location: Left Arm)   Pulse 87   Temp 98.3 F (36.8 C) (Oral)   Resp (!) 22   Ht 6\' 2"  (1.88 m)   Wt 228 lb 13.4 oz (103.8 kg)   SpO2 100%   BMI 29.38 kg/m   Physical Exam Constitutional:      Appearance: Normal appearance.  HENT:     Head:     Comments: Significant ecchymosis to the left face/orbit with dried blood, ecchymotic right eye Cardiovascular:     Rate and Rhythm: Normal rate.  Pulmonary:     Effort: Pulmonary effort is normal. No respiratory distress.  Abdominal:     Palpations: Abdomen is soft.     Tenderness: There is no abdominal tenderness.  Musculoskeletal:     Comments: Right groin with significant ecchymosis. Soft, non-tender. Expect extension of ecchymosis as patient becomes more ambulatory.  Skin:    General: Skin is warm and dry.  Neurological:     General: No focal deficit present.     Mental Status: He is alert and oriented to person, place, and time.  Psychiatric:        Mood and Affect: Mood normal.        Behavior: Behavior normal.        Thought Content: Thought content normal.        Judgment: Judgment normal.     Labs:  CBC: Recent Labs    11/29/21 0625 11/29/21 0640 11/30/21 0413 12/01/21 0809  WBC 19.1*  --  12.0* 11.5*  HGB 14.1 13.9 11.4* 11.3*  HCT 41.5 41.0 33.6* 33.6*  PLT 158  --  137* 163    COAGS: Recent Labs    11/29/21 2135  INR 1.4*  APTT 129*    BMP: Recent Labs    11/29/21 0625 11/29/21 0640 11/29/21 0929 11/30/21 0413  NA 137 139 140 137  K 2.8* 3.0* 3.6 3.4*  CL 103 100 102 105  CO2 24  --  28 24  GLUCOSE 212* 216* 178* 114*   BUN 18 20 15 20   CALCIUM 8.2*  --  8.0* 7.8*  CREATININE 1.35* 1.20 1.28* 1.28*  GFRNONAA 57*  --  >60 >60    LIVER FUNCTION TESTS: Recent Labs    11/29/21 0625 11/30/21 0413  BILITOT 1.0 0.7  AST 21 25  ALT 15 16  ALKPHOS 84 60  PROT 6.1* 5.0*  ALBUMIN 3.3* 2.6*    Assessment and Plan:  Acute Pulmonary Embolism = s/p PE thrombectomy by Dr. 01/30/22.  Right groin with extensive ecchymosis - expect some expansion of the discoloration as patient becomes more ambulatory.  Don't hesitate to call if there are additional concerns.  Electronically Signed: Loreta Ave, PA-C 12/01/2021, 10:59 AM    I spent a total of 15 Minutes at the the patient's bedside AND on the patient's hospital floor or unit, greater than 50% of which was counseling/coordinating care for f/u PE thrombectomy.

## 2021-12-01 NOTE — Plan of Care (Signed)
  Problem: Nutrition: Goal: Adequate nutrition will be maintained Outcome: Progressing   Problem: Elimination: Goal: Will not experience complications related to bowel motility Outcome: Progressing   Problem: Pain Managment: Goal: General experience of comfort will improve Outcome: Progressing   Problem: Safety: Goal: Ability to remain free from injury will improve Outcome: Progressing   

## 2021-12-02 DIAGNOSIS — Z8616 Personal history of COVID-19: Secondary | ICD-10-CM

## 2021-12-02 DIAGNOSIS — S0993XA Unspecified injury of face, initial encounter: Secondary | ICD-10-CM

## 2021-12-02 DIAGNOSIS — E876 Hypokalemia: Secondary | ICD-10-CM

## 2021-12-02 DIAGNOSIS — I5022 Chronic systolic (congestive) heart failure: Secondary | ICD-10-CM

## 2021-12-02 DIAGNOSIS — R55 Syncope and collapse: Secondary | ICD-10-CM

## 2021-12-02 DIAGNOSIS — I480 Paroxysmal atrial fibrillation: Secondary | ICD-10-CM

## 2021-12-02 DIAGNOSIS — D649 Anemia, unspecified: Secondary | ICD-10-CM

## 2021-12-02 DIAGNOSIS — E8809 Other disorders of plasma-protein metabolism, not elsewhere classified: Secondary | ICD-10-CM

## 2021-12-02 LAB — CBC WITH DIFFERENTIAL/PLATELET
Abs Immature Granulocytes: 0.03 10*3/uL (ref 0.00–0.07)
Basophils Absolute: 0.1 10*3/uL (ref 0.0–0.1)
Basophils Relative: 1 %
Eosinophils Absolute: 0.4 10*3/uL (ref 0.0–0.5)
Eosinophils Relative: 4 %
HCT: 31.3 % — ABNORMAL LOW (ref 39.0–52.0)
Hemoglobin: 10.5 g/dL — ABNORMAL LOW (ref 13.0–17.0)
Immature Granulocytes: 0 %
Lymphocytes Relative: 31 %
Lymphs Abs: 3.1 10*3/uL (ref 0.7–4.0)
MCH: 30.2 pg (ref 26.0–34.0)
MCHC: 33.5 g/dL (ref 30.0–36.0)
MCV: 89.9 fL (ref 80.0–100.0)
Monocytes Absolute: 1 10*3/uL (ref 0.1–1.0)
Monocytes Relative: 10 %
Neutro Abs: 5.3 10*3/uL (ref 1.7–7.7)
Neutrophils Relative %: 54 %
Platelets: 150 10*3/uL (ref 150–400)
RBC: 3.48 MIL/uL — ABNORMAL LOW (ref 4.22–5.81)
RDW: 13.9 % (ref 11.5–15.5)
WBC: 10 10*3/uL (ref 4.0–10.5)
nRBC: 0 % (ref 0.0–0.2)

## 2021-12-02 LAB — COMPREHENSIVE METABOLIC PANEL
ALT: 13 U/L (ref 0–44)
AST: 15 U/L (ref 15–41)
Albumin: 2.5 g/dL — ABNORMAL LOW (ref 3.5–5.0)
Alkaline Phosphatase: 57 U/L (ref 38–126)
Anion gap: 6 (ref 5–15)
BUN: 19 mg/dL (ref 8–23)
CO2: 24 mmol/L (ref 22–32)
Calcium: 8.3 mg/dL — ABNORMAL LOW (ref 8.9–10.3)
Chloride: 110 mmol/L (ref 98–111)
Creatinine, Ser: 1.24 mg/dL (ref 0.61–1.24)
GFR, Estimated: >60 mL/min (ref >60)
Glucose, Bld: 118 mg/dL — ABNORMAL HIGH (ref 70–99)
Potassium: 4.3 mmol/L (ref 3.5–5.1)
Sodium: 140 mmol/L (ref 135–145)
Total Bilirubin: 0.6 mg/dL (ref 0.3–1.2)
Total Protein: 5 g/dL — ABNORMAL LOW (ref 6.5–8.1)

## 2021-12-02 LAB — MAGNESIUM: Magnesium: 2 mg/dL (ref 1.7–2.4)

## 2021-12-02 LAB — HEPARIN LEVEL (UNFRACTIONATED): Heparin Unfractionated: 0.33 IU/mL (ref 0.30–0.70)

## 2021-12-02 LAB — PHOSPHORUS: Phosphorus: 3.5 mg/dL (ref 2.5–4.6)

## 2021-12-02 MED ORDER — APIXABAN 5 MG PO TABS
10.0000 mg | ORAL_TABLET | Freq: Two times a day (BID) | ORAL | Status: DC
  Administered 2021-12-02 – 2021-12-03 (×3): 10 mg via ORAL
  Filled 2021-12-02 (×3): qty 2

## 2021-12-02 MED ORDER — APIXABAN 5 MG PO TABS: 5.0000 mg | ORAL_TABLET | Freq: Two times a day (BID) | ORAL | Status: DC

## 2021-12-02 NOTE — Plan of Care (Signed)
  Problem: Education: Goal: Knowledge of General Education information will improve Description: Including pain rating scale, medication(s)/side effects and non-pharmacologic comfort measures Outcome: Adequate for Discharge   Problem: Health Behavior/Discharge Planning: Goal: Ability to manage health-related needs will improve Outcome: Adequate for Discharge   Problem: Clinical Measurements: Goal: Ability to maintain clinical measurements within normal limits will improve Outcome: Adequate for Discharge Goal: Will remain free from infection Outcome: Adequate for Discharge Goal: Diagnostic test results will improve Outcome: Adequate for Discharge Goal: Respiratory complications will improve Outcome: Adequate for Discharge Goal: Cardiovascular complication will be avoided Outcome: Adequate for Discharge   Problem: Activity: Goal: Risk for activity intolerance will decrease Outcome: Adequate for Discharge   Problem: Nutrition: Goal: Adequate nutrition will be maintained Outcome: Adequate for Discharge   Problem: Coping: Goal: Level of anxiety will decrease Outcome: Adequate for Discharge   Problem: Clinical Measurements: Goal: Will remain free from infection Outcome: Adequate for Discharge   Problem: Clinical Measurements: Goal: Diagnostic test results will improve Outcome: Adequate for Discharge   Problem: Clinical Measurements: Goal: Respiratory complications will improve Outcome: Adequate for Discharge   Problem: Clinical Measurements: Goal: Cardiovascular complication will be avoided Outcome: Adequate for Discharge   Problem: Elimination: Goal: Will not experience complications related to bowel motility Outcome: Adequate for Discharge Goal: Will not experience complications related to urinary retention Outcome: Adequate for Discharge   Problem: Safety: Goal: Ability to remain free from injury will improve Outcome: Adequate for Discharge   Problem: Skin  Integrity: Goal: Risk for impaired skin integrity will decrease Outcome: Adequate for Discharge   Problem: Education: Goal: Knowledge of risk factors and measures for prevention of condition will improve Outcome: Adequate for Discharge   Problem: Coping: Goal: Psychosocial and spiritual needs will be supported Outcome: Adequate for Discharge   Problem: Respiratory: Goal: Will maintain a patent airway Outcome: Adequate for Discharge Goal: Complications related to the disease process, condition or treatment will be avoided or minimized Outcome: Adequate for Discharge

## 2021-12-02 NOTE — Progress Notes (Addendum)
ANTICOAGULATION CONSULT NOTE   Pharmacy Consult for heparin  Indication: pulmonary embolus  Allergies  Allergen Reactions   Enalapril Cough    Patient Measurements: Height: 6\' 2"  (188 cm) Weight: 103.8 kg (228 lb 13.4 oz) IBW/kg (Calculated) : 82.2 Heparin Dosing Weight: 103kg   Vital Signs: Temp: 98.4 F (36.9 C) (09/09 0436) Temp Source: Oral (09/08 1956) BP: 134/84 (09/09 0436) Pulse Rate: 93 (09/09 0436)  Labs: Recent Labs    11/29/21 01/29/22 11/29/21 0929 11/29/21 2135 11/30/21 0413 11/30/21 0926 12/01/21 0809 12/01/21 1425 12/02/21 0059  HGB  --    < >  --  11.4*  --  11.3*  --  10.5*  HCT  --   --   --  33.6*  --  33.6*  --  31.3*  PLT  --   --   --  137*  --  163  --  150  APTT  --   --  129*  --   --   --   --   --   LABPROT  --   --  17.3*  --   --   --   --   --   INR  --   --  1.4*  --   --   --   --   --   HEPARINUNFRC  --    < >  --   --    < > 0.58 0.34 0.33  HEPRLOWMOCWT  --   --  0.45  --   --   --   --   --   CREATININE  --    < >  --  1.28*  --  1.22  --  1.24  TROPONINIHS 984*  --   --   --   --   --   --   --    < > = values in this interval not displayed.     Estimated Creatinine Clearance: 73.2 mL/min (by C-G formula based on SCr of 1.24 mg/dL).   Medical History: Past Medical History:  Diagnosis Date   Hypertension    Assessment: 71 y.o M with history of PE/DVT and afib not on anticoagulation PTA admitted for a chief complaint of syncope. CTA + for acute PE with evidence of heart strain consistent with submassive PE.   Heparin level 0.33, therapeutic  No overt s/s of bleeding. No problems noted with infusion. Plans noted by MD for possible transition to oral anticoagulation on 9/9.  Goal of Therapy:  Heparin level 0.3-0.7 units/ml Monitor platelets by anticoagulation protocol: Yes   Plan:  Continue heparin infusion at 1900 units/hr Heparin level daily while on heparin Continue to monitor H&H and platelets F/u transition to  oral anticoagulation  Thank you for allowing pharmacy to participate in this patient's care.  11/9, PharmD PGY1 Acute Care Resident  12/02/2021,7:25 AM  ____________________________________________ Addendum:  Plans to transition from heparin to eliquis. Will start eliquis 10mg  BID x 7 days then transition to 5mg  BID. Per patient insurance, copay will be $35 per month. Will follow up CBC with daily labs.   Thank you for allowing pharmacy to participate in this patient's care.  02/01/2022, PharmD PGY1 Acute Care Resident  12/02/2021,2:21 PM

## 2021-12-02 NOTE — Plan of Care (Signed)
  Problem: Nutrition: Goal: Adequate nutrition will be maintained Outcome: Progressing   Problem: Safety: Goal: Ability to remain free from injury will improve Outcome: Progressing   Problem: Skin Integrity: Goal: Risk for impaired skin integrity will decrease Outcome: Progressing   

## 2021-12-02 NOTE — Progress Notes (Signed)
SATURATION QUALIFICATIONS: (This note is used to comply with regulatory documentation for home oxygen)  Patient Saturations on Room Air at Rest = 96%  Patient Saturations on Room Air while Ambulating = 89%  Patient Saturations on NA Liters of oxygen while Ambulating = NA%  [employer]

## 2021-12-02 NOTE — Progress Notes (Addendum)
PROGRESS NOTE    Paul Mitchell  [id number] DOB: 07/01/1950 DOA: 11/29/2021 PCP: Sofie Hartigan, MD   Brief Narrative:  The patient is a 71 year old overweight Caucasian male with a past medical history significant for #2 hypertension and a history of COVID in 2021 he required oxygen for several months afterwards along with anticoagulation for pulmonary embolus.  He was off of oxygen off of treatment for pulmonary embolism for anticoagulation and on 11/29/2021 he had a fall with facial trauma losing consciousness admission to the ED.  CT scan of the head through chest and abdomen revealed a bilateral large saddle pulmonary embolus with no acute injury to the head or neck or abdomen.  Initially he required 15 L nonrebreather to have his sats greater than 92% and pulmonary critical care was consulted and IR was consulted for further evaluation and treatment.  He was placed on IV heparin and interventional radiology took the patient for mechanical thrombectomy.  Hemoglobin dropped afterwards and he developed a slight hematoma.  He is improving and oxygen is weaning and he is on 2 L supplemental oxygen via nasal cannula however he desaturated on room air.  We expect weaning further and will obtain PT and OT to further evaluate and treat.  He remains on a heparin drip and pharmacy is managing.  We will need to transition to oral anticoagulation at some point.   Current significant hospital events and hospital course showed: Significant Hospital Events: Including procedures, antibiotic start and stop dates in addition to other pertinent events   9/6 admitted after syncopal episode with GLF +head injury, CT head/neck negative, CTA chest showed saddle pe, underwent thrombectomy, heparin gtt, question of expanding hematoma over right femoral access, no signs of active bleeding 9/7 off supplemental O2, stable bruising around right femoral access, hemodynamically stable, transfer out of ICU   He was doing  well yesterday morning and he is titrated down from 7 L high flow nasal cannula to 2 L and was transferred to the [facility] service on 12/01/2021.   Patient was off of oxygen this morning however he ambulated and desaturated while ambulating on room air yesterday to 86% we will transition him from IV heparin to p.o. apixaban today at 10 mg twice daily for 7 days and then transition to 5 mg p.o. twice daily afterwards.  We will continue mobilization will order a flutter valve incentive spirometer.  Anticipating discharging home the next 24 to 48 hours and will repeat the ambulatory home O2 screen tomorrow to see if he will require supplemental oxygen.  Awaiting wound care to evaluate his face   Assessment and Plan:  Massive Pulmonary Embolism with Acute hypoxic respiratory failure and syncope -Bedside echo revealing RV strain and RV septal flattening. -S/p thrombectomy on 9/6 with a small post op hematoma around the right femoral access, no interval progression.  -Titrated down on Supplemental O2. He will need to be anticoagulated for life since this is a second event -Heparin gtt be transitioned to oral anticoagulation with apixaban today -SpO2: 98 % O2 Flow Rate (L/min): 2 L/min -Will need Ambulatory Home O2 Screen prior to D/C -Transferred out of ICU to Medical Telemetry and [place] assumed Attending 12/01/21 -WBC went from 12.0 -> 11.5 and this is now 10.0 -Follow-up PT OT evaluations   History of COVID 03/15/2021 that required oxygen for approximately 6 months. -Monitor O2 saturations, goal of 90+ -SpO2: 98 % O2 Flow Rate (L/min): 2 L/min -He is followed by the [employer]  [facility] for his pulmonary   History of congestive heart failure with a EF of 40%. History of paroxysmal atrial fibrillation -Careful with fluid administration -May need Cardiology consult if he shows signs of acute HF exacerbation but he currently appears euvolemic   Syncope resulting in facial trauma -We will need PT  OT to evaluate and treat however he is ambulating quite well with the mobility specialist -His head and neck CT was negative -Continue with analgesics and daily wound cleaning of his face and [place] nurse has been consulted and stated awaiting evaluation -He was sutured in the ED -Continue to monitor on telemetry and echocardiogram was done as below -We will check orthostatic vital signs   Normocytic Anemia -Patient's hemoglobin/hematocrit went from 11.4/33.6 -> 11.3/33.6 and is now 10.5/31.3 -Check anemia panel in the a.m. -Continue monitor for signs and symptoms of bleeding as he is on a heparin drip and will be transition to oral anticoagulation with apixaban -Repeat CBC in a.m.  Hypoalbuminemia -Patient's albumin level has gone from 2.6 -> 2.9 -> 2.5 -Continue to monitor and trend and repeat CMP in a.m.   Hypokalemia -Improved and potassium is now 4.3 and stable -Continue monitor and replete as necessary -Repeat CMP in a.m.   Hypophosphatemia -Patient's Phos level was 2.3 yesterday and after repletion to 3.5 today -Replete with p.o. K-Phos Neutral 500 mg x 1 yesterday -Repeat Phos level in a.m.   DVT prophylaxis: SCDs Start: 11/29/21 0854 apixaban (ELIQUIS) tablet 10 mg  apixaban (ELIQUIS) tablet 5 mg    Code Status: Full Code Family Communication: Discussed with the patient's daughter and wife at bedside  Disposition Plan:  Level of care: Telemetry Medical Status is: Inpatient Remains inpatient appropriate because: We will be transitioning from IV anticoagulation to oral anticoagulation and will need to repeat amatory home O2 screen prior to discharge.   Consultants:  PCCM transfer Interventional radiology  Procedures:  Procedure:    US guided right CFV access PA angiogram, pressure measurement Aspiration/mechanical thrombectomy of PE, bilateral.    Findings: Initial PA pressure: 54/35 (42) After left treatment: 42/30 (35) After left & right treatment = final:  34/21 (26)  Antimicrobials:  Anti-infectives (From admission, onward)    None       Subjective: Seen and examined at bedside thinks he is doing little bit better.  Was weaned off of supplemental oxygen today but per the nurse he desaturated so we will need to repeat an amatory home O2 screen.  Will be transitioning from heparin drip to Eliquis to see how he tolerates this and will need to monitor his blood count.  If stable and he does not desaturate tomorrow can be discharged home.  Objective: Vitals:   12/01/21 0819 12/01/21 1956 12/02/21 0436 12/02/21 0810  BP: 127/85 (!) 143/76 134/84 135/74  Pulse: 87 100 93 91  Resp: (!) 22 20 (!) 21 (!) 21  Temp: 98.3 F (36.8 C) 98.7 F (37.1 C) 98.4 F (36.9 C) 98.6 F (37 C)  TempSrc: Oral Oral  Oral  SpO2: 100% 98% 96% 98%  Weight:      Height:        Intake/Output Summary (Last 24 hours) at 12/02/2021 1617 Last data filed at 12/02/2021 1309 Gross per 24 hour  Intake 600 ml  Output 1150 ml  Net -550 ml   Filed Weights   11/29/21 0611 11/30/21 0500  Weight: 103.4 kg 103.8 kg   Examination: Physical Exam:  Constitutional: WN/WD overweight Caucasian male with  some facial trauma and sutures on the side of his face and no acute distress in a chair bedside Respiratory: Diminished to auscultation bilaterally with coarse breath sounds, no wheezing, rales, rhonchi or crackles. Normal respiratory effort and patient is not tachypenic. No accessory muscle use.  Unlabored breathing and is now off of supplemental oxygen via nasal cannula Cardiovascular: RRR, no murmurs / rubs / gallops. S1 and S2 auscultated.  Trace extremity edema and some bruising on his right thigh Abdomen: Soft, non-tender, distended secondary to body habitus. Bowel sounds positive.  GU: Deferred. Musculoskeletal: No clubbing / cyanosis of digits/nails. No joint deformity upper and lower extremities.  Skin: No rashes, lesions, ulcers on limited skin evaluation. No  induration; Warm and dry.  Neurologic: CN 2-12 grossly intact with no focal deficits. Romberg sign and cerebellar reflexes not assessed.  Psychiatric: Normal judgment and insight. Alert and oriented x 3. Normal mood and appropriate affect.   Data Reviewed: I have personally reviewed following labs and imaging studies  CBC: Recent Labs  Lab 11/29/21 0625 11/29/21 0640 11/30/21 0413 12/01/21 0809 12/02/21 0059  WBC 19.1*  --  12.0* 11.5* 10.0  NEUTROABS 13.4*  --   --   --  5.3  HGB 14.1 13.9 11.4* 11.3* 10.5*  HCT 41.5 41.0 33.6* 33.6* 31.3*  MCV 88.1  --  89.4 90.8 89.9  PLT 158  --  137* 163 150   Basic Metabolic Panel: Recent Labs  Lab 11/29/21 0625 11/29/21 0640 11/29/21 0929 11/30/21 0413 12/01/21 0809 12/02/21 0059  NA 137 139 140 137 140 140  K 2.8* 3.0* 3.6 3.4* 4.5 4.3  CL 103 100 102 105 112* 110  CO2 24  --  28 24 21* 24  GLUCOSE 212* 216* 178* 114* 124* 118*  BUN 18 20 15 20 20 19   CREATININE 1.35* 1.20 1.28* 1.28* 1.22 1.24  CALCIUM 8.2*  --  8.0* 7.8* 8.2* 8.3*  MG 1.7  --   --  2.1 2.0 2.0  PHOS  --   --   --   --  2.3* 3.5   GFR: Estimated Creatinine Clearance: 73.2 mL/min (by C-G formula based on SCr of 1.24 mg/dL). Liver Function Tests: Recent Labs  Lab 11/29/21 0625 11/30/21 0413 12/01/21 0809 12/02/21 0059  AST 21 25 24 15   ALT 15 16 15 13   ALKPHOS 84 60 58 57  BILITOT 1.0 0.7 0.4 0.6  PROT 6.1* 5.0* 5.4* 5.0*  ALBUMIN 3.3* 2.6* 2.9* 2.5*   No results for input(s): "LIPASE", "AMYLASE" in the last 168 hours. No results for input(s): "AMMONIA" in the last 168 hours. Coagulation Profile: Recent Labs  Lab 11/29/21 2135  INR 1.4*   Cardiac Enzymes: Recent Labs  Lab 11/29/21 0625  CKTOTAL 241   BNP (last 3 results) No results for input(s): "PROBNP" in the last 8760 hours. HbA1C: No results for input(s): "HGBA1C" in the last 72 hours. CBG: Recent Labs  Lab 11/29/21 0606 11/29/21 1554  GLUCAP 198* 161*   Lipid Profile: No  results for input(s): "CHOL", "HDL", "LDLCALC", "TRIG", "CHOLHDL", "LDLDIRECT" in the last 72 hours. Thyroid Function Tests: No results for input(s): "TSH", "T4TOTAL", "FREET4", "T3FREE", "THYROIDAB" in the last 72 hours. Anemia Panel: No results for input(s): "VITAMINB12", "FOLATE", "FERRITIN", "TIBC", "IRON", "RETICCTPCT" in the last 72 hours. Sepsis Labs: Recent Labs  Lab 11/29/21 0929 11/29/21 1510  PROCALCITON 0.27  --   LATICACIDVEN 2.2* 2.9*    Recent Results (from the past 240 hour(s))  Resp  Panel by RT-PCR (Flu A&B, Covid) Anterior Nasal Swab     Status: None   Collection Time: 11/29/21  7:35 AM   Specimen: Anterior Nasal Swab  Result Value Ref Range Status   SARS Coronavirus 2 by RT PCR NEGATIVE NEGATIVE Final    Comment: (NOTE) SARS-CoV-2 target nucleic acids are NOT DETECTED.  The SARS-CoV-2 RNA is generally detectable in upper respiratory specimens during the acute phase of infection. The lowest concentration of SARS-CoV-2 viral copies this assay can detect is 138 copies/mL. A negative result does not preclude SARS-Cov-2 infection and should not be used as the sole basis for treatment or other patient management decisions. A negative result may occur with  improper specimen collection/handling, submission of specimen other than nasopharyngeal swab, presence of viral mutation(s) within the areas targeted by this assay, and inadequate number of viral copies(<138 copies/mL). A negative result must be combined with clinical observations, patient history, and epidemiological information. The expected result is Negative.  Fact Sheet for Patients:  BloggerCourse.com  Fact Sheet for Healthcare Providers:  SeriousBroker.it  This test is no t yet approved or cleared by the [place] FDA and  has been authorized for detection and/or diagnosis of SARS-CoV-2 by FDA under an Emergency Use Authorization (EUA). This EUA  will remain  in effect (meaning this test can be used) for the duration of the COVID-19 declaration under Section 564(b)(1) of the Act, 21 U.S.C.section 360bbb-3(b)(1), unless the authorization is terminated  or revoked sooner.       Influenza A by PCR NEGATIVE NEGATIVE Final   Influenza B by PCR NEGATIVE NEGATIVE Final    Comment: (NOTE) The Xpert Xpress SARS-CoV-2/FLU/RSV plus assay is intended as an aid in the diagnosis of influenza from Nasopharyngeal swab specimens and should not be used as a sole basis for treatment. Nasal washings and aspirates are unacceptable for Xpert Xpress SARS-CoV-2/FLU/RSV testing.  Fact Sheet for Patients: BloggerCourse.com  Fact Sheet for Healthcare Providers: SeriousBroker.it  This test is not yet approved or cleared by the [place] FDA and has been authorized for detection and/or diagnosis of SARS-CoV-2 by FDA under an Emergency Use Authorization (EUA). This EUA will remain in effect (meaning this test can be used) for the duration of the COVID-19 declaration under Section 564(b)(1) of the Act, 21 U.S.C. section 360bbb-3(b)(1), unless the authorization is terminated or revoked.  Performed at [facility], [address] [place], [place] [address]   MRSA Next Gen by PCR, Nasal     Status: None   Collection Time: 11/29/21  1:41 PM   Specimen: Nasal Mucosa; Nasal Swab  Result Value Ref Range Status   MRSA by PCR Next Gen NOT DETECTED NOT DETECTED Final    Comment: (NOTE) The GeneXpert MRSA Assay (FDA approved for NASAL specimens only), is one component of a comprehensive MRSA colonization surveillance program. It is not intended to diagnose MRSA infection nor to guide or monitor treatment for MRSA infections. Test performance is not FDA approved in patients less than 53 years old. Performed at [facility], [address], [place], [place] [address]     Radiology  Studies: No results found.  Scheduled Meds:  apixaban  10 mg Oral BID   Followed by   Melene Muller ON 12/09/2021] apixaban  5 mg Oral BID   Chlorhexidine Gluconate Cloth  6 each Topical Daily   pantoprazole  40 mg Oral Daily   Continuous Infusions:   LOS: 3 days   Marguerita Merles,  DO [facility] Available via Epic secure chat 7am-7pm After these hours, please refer to coverage provider listed on [email or web address] 12/02/2021, 4:17 PM

## 2021-12-02 NOTE — Progress Notes (Signed)
Mobility Specialist Progress Note:   12/02/21 1543  Mobility  Activity Ambulated independently in hallway  Level of Assistance Standby assist, set-up cues, supervision of patient - no hands on  Assistive Device None  Distance Ambulated (ft) 550 ft  Activity Response Tolerated well  $Mobility charge 1 Mobility   Pt received in chair and agreeable. No complaints. Pt left in room with all needs met and call bell in reach.   Sam Wunschel Mobility Specialist-Acute Rehab Secure Chat only

## 2021-12-02 NOTE — Plan of Care (Signed)
  Problem: Education: Goal: Knowledge of General Education information will improve Description: Including pain rating scale, medication(s)/side effects and non-pharmacologic comfort measures Outcome: Progressing   Problem: Activity: Goal: Risk for activity intolerance will decrease Outcome: Progressing   Problem: Nutrition: Goal: Adequate nutrition will be maintained Outcome: Progressing   Problem: Coping: Goal: Level of anxiety will decrease Outcome: Progressing   

## 2021-12-03 DIAGNOSIS — J9601 Acute respiratory failure with hypoxia: Secondary | ICD-10-CM

## 2021-12-03 DIAGNOSIS — E8809 Other disorders of plasma-protein metabolism, not elsewhere classified: Secondary | ICD-10-CM

## 2021-12-03 DIAGNOSIS — I480 Paroxysmal atrial fibrillation: Secondary | ICD-10-CM

## 2021-12-03 DIAGNOSIS — I5022 Chronic systolic (congestive) heart failure: Secondary | ICD-10-CM

## 2021-12-03 DIAGNOSIS — R55 Syncope and collapse: Secondary | ICD-10-CM

## 2021-12-03 DIAGNOSIS — Z8616 Personal history of COVID-19: Secondary | ICD-10-CM

## 2021-12-03 DIAGNOSIS — S0993XS Unspecified injury of face, sequela: Secondary | ICD-10-CM

## 2021-12-03 LAB — CBC WITH DIFFERENTIAL/PLATELET
Abs Immature Granulocytes: 0.03 10*3/uL (ref 0.00–0.07)
Basophils Absolute: 0.1 10*3/uL (ref 0.0–0.1)
Basophils Relative: 1 %
Eosinophils Absolute: 0.3 10*3/uL (ref 0.0–0.5)
Eosinophils Relative: 4 %
HCT: 35.6 % — ABNORMAL LOW (ref 39.0–52.0)
Hemoglobin: 11.8 g/dL — ABNORMAL LOW (ref 13.0–17.0)
Immature Granulocytes: 0 %
Lymphocytes Relative: 28 %
Lymphs Abs: 2.3 10*3/uL (ref 0.7–4.0)
MCH: 29.9 pg (ref 26.0–34.0)
MCHC: 33.1 g/dL (ref 30.0–36.0)
MCV: 90.4 fL (ref 80.0–100.0)
Monocytes Absolute: 0.9 10*3/uL (ref 0.1–1.0)
Monocytes Relative: 11 %
Neutro Abs: 4.6 10*3/uL (ref 1.7–7.7)
Neutrophils Relative %: 56 %
Platelets: 185 10*3/uL (ref 150–400)
RBC: 3.94 MIL/uL — ABNORMAL LOW (ref 4.22–5.81)
RDW: 13.7 % (ref 11.5–15.5)
WBC: 8.2 10*3/uL (ref 4.0–10.5)
nRBC: 0 % (ref 0.0–0.2)

## 2021-12-03 LAB — PHOSPHORUS: Phosphorus: 3.2 mg/dL (ref 2.5–4.6)

## 2021-12-03 LAB — COMPREHENSIVE METABOLIC PANEL
ALT: 15 U/L (ref 0–44)
AST: 18 U/L (ref 15–41)
Albumin: 2.8 g/dL — ABNORMAL LOW (ref 3.5–5.0)
Alkaline Phosphatase: 60 U/L (ref 38–126)
Anion gap: 5 (ref 5–15)
BUN: 18 mg/dL (ref 8–23)
CO2: 23 mmol/L (ref 22–32)
Calcium: 8.4 mg/dL — ABNORMAL LOW (ref 8.9–10.3)
Chloride: 112 mmol/L — ABNORMAL HIGH (ref 98–111)
Creatinine, Ser: 1.07 mg/dL (ref 0.61–1.24)
GFR, Estimated: >60 mL/min (ref >60)
Glucose, Bld: 128 mg/dL — ABNORMAL HIGH (ref 70–99)
Potassium: 4.7 mmol/L (ref 3.5–5.1)
Sodium: 140 mmol/L (ref 135–145)
Total Bilirubin: 0.7 mg/dL (ref 0.3–1.2)
Total Protein: 5.7 g/dL — ABNORMAL LOW (ref 6.5–8.1)

## 2021-12-03 LAB — MAGNESIUM: Magnesium: 2.1 mg/dL (ref 1.7–2.4)

## 2021-12-03 MED ORDER — FUROSEMIDE 20 MG PO TABS: 20.0000 mg | ORAL_TABLET | Freq: Every day | ORAL | 0 refills | Status: AC | PRN

## 2021-12-03 MED ORDER — ACETAMINOPHEN 325 MG PO TABS: 650.0000 mg | ORAL_TABLET | Freq: Four times a day (QID) | ORAL | 0 refills | Status: AC | PRN

## 2021-12-03 MED ORDER — PANTOPRAZOLE SODIUM 40 MG PO TBEC: 40.0000 mg | DELAYED_RELEASE_TABLET | Freq: Every day | ORAL | 0 refills | Status: AC

## 2021-12-03 MED ORDER — POLYETHYLENE GLYCOL 3350 17 G PO PACK: 17.0000 g | PACK | Freq: Every day | ORAL | 0 refills | Status: AC | PRN

## 2021-12-03 MED ORDER — APIXABAN 5 MG PO TABS
ORAL_TABLET | ORAL | 0 refills | Status: AC
Start: 2021-12-03 — End: 2022-01-08

## 2021-12-03 MED ORDER — DOCUSATE SODIUM 100 MG PO CAPS: 100.0000 mg | ORAL_CAPSULE | Freq: Two times a day (BID) | ORAL | 0 refills | Status: AC | PRN

## 2021-12-03 NOTE — Progress Notes (Signed)
Pt left the hospital after AVS was given and explained to pt, wife and daughter were included in the teaching. Bleeding precaution was emphasized.

## 2021-12-03 NOTE — Discharge Summary (Signed)
Physician Discharge Summary   Patient: Paul Mitchell MRN: [id number] DOB: June 02, 1950  Admit date:     11/29/2021  Discharge date: 12/03/21  Discharge Physician: Raiford Noble, DO   PCP: Sofie Hartigan, MD   Recommendations at discharge:   Follow-up with PCP within 1 to 2 weeks and repeat CBC, CMP, mag, Phos within 1 week Follow-up with pulmonary in outpatient setting within 1 to 2 weeks Repeat chest x-ray in 3 to 6 weeks if necessary  Discharge Diagnoses: Principal Problem:   Pulmonary embolism ([place]) Active Problems:   Acute respiratory failure with hypoxia (HCC)   History of [id number]   Chronic systolic CHF (congestive heart failure) (HCC)   PAF (paroxysmal atrial fibrillation) (HCC)   Syncope and collapse   Facial trauma   Normocytic anemia   Hypoalbuminemia   Hypokalemia   Hypophosphatemia  Resolved Problems:   * No resolved hospital problems. [facility] Course: The patient is a 71 year old overweight Caucasian male with a past medical history significant for #2 hypertension and a history of COVID in 2021 he required oxygen for several months afterwards along with anticoagulation for pulmonary embolus.  He was off of oxygen off of treatment for pulmonary embolism for anticoagulation and on 11/29/2021 he had a fall with facial trauma losing consciousness admission to the ED.  CT scan of the head through chest and abdomen revealed a bilateral large saddle pulmonary embolus with no acute injury to the head or neck or abdomen.  Initially he required 15 L nonrebreather to have his sats greater than 92% and pulmonary critical care was consulted and IR was consulted for further evaluation and treatment.  He was placed on IV heparin and interventional radiology took the patient for mechanical thrombectomy.  Hemoglobin dropped afterwards and he developed a slight hematoma.  He is improving and oxygen is weaning and he is on 2 L supplemental oxygen via nasal cannula however he  desaturated on room air.  We expect weaning further and will obtain PT and OT to further evaluate and treat.  He remains on a heparin drip and pharmacy is managing.  We will need to transition to oral anticoagulation at some point.   Current significant hospital events and hospital course showed: Significant Hospital Events: Including procedures, antibiotic start and stop dates in addition to other pertinent events   9/6 admitted after syncopal episode with GLF +head injury, CT head/neck negative, CTA chest showed saddle pe, underwent thrombectomy, heparin gtt, question of expanding hematoma over right femoral access, no signs of active bleeding 9/7 off supplemental O2, stable bruising around right femoral access, hemodynamically stable, transfer out of ICU   He was doing well yesterday morning and he is titrated down from 7 L high flow nasal cannula to 2 L and was transferred to the [facility] service on 12/01/2021.    Patient was off of oxygen this morning however he ambulated and desaturated while ambulating on room air yesterday to 86% we will transition him from IV heparin to p.o. apixaban today at 10 mg twice daily for 7 days and then transition to 5 mg p.o. twice daily afterwards.  We will continue mobilization will order a flutter valve incentive spirometer.  Anticipating discharging home the next 24 to 48 hours and will repeat the ambulatory home O2 screen tomorrow to see if he will require supplemental oxygen.  Awaiting wound care to evaluate his face where this can be done in outpatient setting.  Patient is medically stable for discharge and  he did not desaturate at this time and will be discharged on anticoagulation likely lifelong.  He will need to follow-up with his PCP closely and was advised to follow-up with him on Monday or Tuesday.  He is stable for discharge at this time and does not require oxygen for home   Assessment and Plan:  Massive Pulmonary Embolism with Acute hypoxic respiratory  failure and syncope -Bedside echo revealing RV strain and RV septal flattening. -S/p thrombectomy on 9/6 with a small post op hematoma around the right femoral access, no interval progression.  -Titrated down on Supplemental O2. He will need to be anticoagulated for life since this is a second event -Heparin gtt be transitioned to oral anticoagulation with apixaban today -Passed his ambulatory Home O2 Screen prior to D/C -Transferred out of ICU to Medical Telemetry and TRH assumed Attending 12/01/21 -WBC went from 12.0 -> 11.5 and this is now 10.0 yesterday and today is 8.2 -Follow-up PT OT evaluations and they are recommending a follow-up   History of COVID 03/15/2021 that required oxygen for approximately 6 months. -Monitor O2 saturations, goal of 90+ -SpO2: 95 % O2 Flow Rate (L/min): 2 L/min -He is followed by the [facility] for his pulmonary -Patient did not desaturate on ambulation and can follow-up with his PCP and pulmonary in outpatient setting   History of congestive heart failure with a EF of 40%. History of paroxysmal atrial fibrillation -Careful with fluid administration -May need Cardiology consult if he shows signs of acute HF exacerbation but he currently appears euvolemic -Appears euvolemic and can be resumed on his Lasix as needed in the outpatient setting   Syncope resulting in facial trauma -We will need PT OT to evaluate and treat however he is ambulating quite well with the mobility specialist -His head and neck CT was negative -Continue with analgesics and daily wound cleaning of his face and WOC nurse has been consulted and stated awaiting evaluation -He was sutured in the ED -Continue to monitor on telemetry and echocardiogram was done as below -We will check orthostatic vital signs   Normocytic Anemia -Patient's hemoglobin/hematocrit went from 11.4/33.6 -> 11.3/33.6 and is now 10.5/31.3 yesterday and at the time of discharge 11.8/35.6 -Check  anemia panel in the outpatient setting -Continue monitor for signs and symptoms of bleeding as he is on a heparin drip and will be transition to oral anticoagulation with apixaban -Repeat CBC within a week   Hypoalbuminemia -Patient's albumin level has gone from 2.6 -> 2.9 -> 2.5 and is now 2.8 at the time of discharge -Continue to monitor and trend and repeat CMP in a.m.   Hypokalemia -Improved and potassium is now 4.7 and stable -Continue monitor and replete as necessary -Repeat CMP within 1 week   Hypophosphatemia -Patient's Phos level was 2.3 yesterday and after repletion to 3.5 today -Replete with p.o. K-Phos Neutral 500 mg x 1 yesterday -Repeat Phos level within 1 week  Consultants: PCCM transfer and interventional radiology Procedures performed: Ultrasound-guided right CEA IV access with the PA angiogram and pressure measurement and aspiration and mechanical thrombectomy of the PE bilaterally done by Dr. Loreta Ave Disposition: Home Diet recommendation:  Discharge Diet Orders (From admission, onward)     Start     Ordered   12/03/21 0000  Diet - low sodium heart healthy        12/03/21 1442           Cardiac diet DISCHARGE MEDICATION: Allergies as of 12/03/2021  Reactions   Enalapril Cough        Medication List     STOP taking these medications    amiodarone 200 MG tablet Commonly known as: PACERONE   aspirin EC 81 MG tablet   guaiFENesin-dextromethorphan 100-10 MG/5ML syrup Commonly known as: ROBITUSSIN DM   hydrochlorothiazide 12.5 MG capsule Commonly known as: MICROZIDE       TAKE these medications    acetaminophen 325 MG tablet Commonly known as: TYLENOL Take 2 tablets (650 mg total) by mouth every 6 (six) hours as needed for headache or moderate pain.   albuterol 108 (90 Base) MCG/ACT inhaler Commonly known as: VENTOLIN HFA Inhale 2 puffs into the lungs every 6 (six) hours as needed for wheezing or shortness of breath.   apixaban 5  MG Tabs tablet Commonly known as: ELIQUIS Take 2 tablets (10 mg total) by mouth 2 (two) times daily for 6 days, THEN 1 tablet (5 mg total) 2 (two) times daily. Start taking on: December 03, 2021 What changed:  medication strength See the new instructions.   docusate sodium 100 MG capsule Commonly known as: COLACE Take 1 capsule (100 mg total) by mouth 2 (two) times daily as needed for mild constipation.   furosemide 20 MG tablet Commonly known as: LASIX Take 1 tablet (20 mg total) by mouth daily as needed for fluid or edema. What changed:  when to take this reasons to take this   pantoprazole 40 MG tablet Commonly known as: PROTONIX Take 1 tablet (40 mg total) by mouth daily. Start taking on: December 04, 2021   polyethylene glycol 17 g packet Commonly known as: MIRALAX / GLYCOLAX Take 17 g by mouth daily as needed for moderate constipation.               Discharge Care Instructions  (From admission, onward)           Start     Ordered   12/03/21 0000  Discharge wound care:       Comments: Follow up for facial wound care and suture removal.   12/03/21 1442            Discharge Exam: Filed Weights   11/29/21 0611 11/30/21 0500  Weight: 103.4 kg 103.8 kg   Vitals:   12/03/21 0344 12/03/21 0843  BP: 131/74 126/73  Pulse: 89 98  Resp: 20 20  Temp: 98.7 F (37.1 C) 98.4 F (36.9 C)  SpO2: 96% 95%   Examination: Physical Exam:  Constitutional: WN/WD overweight Caucasian male currently no acute distress appears calm sitting in chair; has no facial trauma and bruising on his face with some dried blood which is slowly healing and has some stitches Respiratory: Diminished to auscultation bilaterally, no wheezing, rales, rhonchi or crackles. Normal respiratory effort and patient is not tachypenic. No accessory muscle use.  Unlabored breathing Cardiovascular: RRR, no murmurs / rubs / gallops. S1 and S2 auscultated.  Trace extremity edema Abdomen: Soft,  non-tender, distended secondary body habitus. Bowel sounds positive.  GU: Deferred. Musculoskeletal: No clubbing / cyanosis of digits/nails. No joint deformity upper and lower extremities.  Skin: No rashes, lesions, ulcers on limited skin evaluation. No induration; Warm and dry.  Neurologic: CN 2-12 grossly intact with no focal deficits.  Romberg sign and cerebellar reflexes not assessed.  Psychiatric: Normal judgment and insight. Alert and oriented x 3. Normal mood and appropriate affect.   Condition at discharge: stable  The results of significant diagnostics from this hospitalization (including imaging,  microbiology, ancillary and laboratory) are listed below for reference.   Imaging Studies: ECHOCARDIOGRAM COMPLETE  Result Date: 11/30/2021    ECHOCARDIOGRAM REPORT   Patient Name:   Paul Mitchell Date of Exam: 11/30/2021 Medical Rec #:  [id number]       Height:       74.0 in Accession #:    [id number]      Weight:       228.8 lb Date of Birth:  01/31/1951      BSA:          2.302 m Patient Age:    34 years        BP:           93/65 mmHg Patient Gender: M               HR:           87 bpm. Exam Location:  Inpatient Procedure: 2D Echo, Color Doppler and Cardiac Doppler Indications:    Pulmonary Embolus  History:        Patient has prior history of Echocardiogram examinations, most                 recent 03/04/2020. Risk Factors:Hypertension.  Sonographer:    Memory Argue Referring Phys: [place]  1. Mcconnell's sign noted.  2. Left ventricular ejection fraction, by estimation, is 65 to 70%. The left ventricle has normal function. The left ventricle has no regional wall motion abnormalities. There is mild left ventricular hypertrophy. Left ventricular diastolic parameters are consistent with Grade I diastolic dysfunction (impaired relaxation).  3. Right ventricular systolic function is moderately reduced. The right ventricular size is normal. There is moderately elevated  pulmonary artery systolic pressure.  4. The mitral valve is normal in structure. No evidence of mitral valve regurgitation. No evidence of mitral stenosis.  5. The aortic valve is tricuspid. Aortic valve regurgitation is not visualized. Aortic valve sclerosis/calcification is present, without any evidence of aortic stenosis.  6. Aortic dilatation noted. There is mild dilatation of the aortic root, measuring 44 mm.  7. The inferior vena cava is dilated in size with >50% respiratory variability, suggesting right atrial pressure of 8 mmHg. FINDINGS  Left Ventricle: Left ventricular ejection fraction, by estimation, is 65 to 70%. The left ventricle has normal function. The left ventricle has no regional wall motion abnormalities. The left ventricular internal cavity size was normal in size. There is  mild left ventricular hypertrophy. Left ventricular diastolic parameters are consistent with Grade I diastolic dysfunction (impaired relaxation). Right Ventricle: The right ventricular size is normal. Right ventricular systolic function is moderately reduced. There is moderately elevated pulmonary artery systolic pressure. The tricuspid regurgitant velocity is 3.15 m/s, and with an assumed right atrial pressure of 8 mmHg, the estimated right ventricular systolic pressure is [id number] mmHg. Left Atrium: Left atrial size was normal in size. Right Atrium: Right atrial size was normal in size. Pericardium: Trivial pericardial effusion is present. Mitral Valve: The mitral valve is normal in structure. No evidence of mitral valve regurgitation. No evidence of mitral valve stenosis. Tricuspid Valve: The tricuspid valve is normal in structure. Tricuspid valve regurgitation is mild . No evidence of tricuspid stenosis. Aortic Valve: The aortic valve is tricuspid. Aortic valve regurgitation is not visualized. Aortic valve sclerosis/calcification is present, without any evidence of aortic stenosis. Aortic valve mean gradient measures 4.0  mmHg. Aortic valve peak gradient measures 7.0 mmHg. Aortic valve area, by VTI measures 4.57  cm. Pulmonic Valve: The pulmonic valve was normal in structure. Pulmonic valve regurgitation is not visualized. No evidence of pulmonic stenosis. Aorta: Aortic dilatation noted. There is mild dilatation of the aortic root, measuring 44 mm. Venous: The inferior vena cava is dilated in size with greater than 50% respiratory variability, suggesting right atrial pressure of 8 mmHg. IAS/Shunts: No atrial level shunt detected by color flow Doppler. Additional Comments: Mcconnell's sign noted.  LEFT VENTRICLE PLAX 2D LVIDd:         4.80 cm   Diastology LVIDs:         2.70 cm   LV e' medial:    9.36 cm/s LV PW:         1.10 cm   LV E/e' medial:  6.5 LV IVS:        1.20 cm   LV e' lateral:   7.40 cm/s LVOT diam:     2.60 cm   LV E/e' lateral: 8.3 LV SV:         105 LV SV Index:   45 LVOT Area:     5.31 cm  RIGHT VENTRICLE TAPSE (M-mode): 2.0 cm LEFT ATRIUM             Index        RIGHT ATRIUM           Index LA diam:        3.10 cm 1.35 cm/m   RA Area:     18.43 cm LA Vol (A2C):   69.9 ml 30.37 ml/m  RA Volume:   45.97 ml  19.97 ml/m LA Vol (A4C):   44.9 ml 19.51 ml/m LA Biplane Vol: 56.7 ml 24.64 ml/m  AORTIC VALVE AV Area (Vmax):    5.07 cm AV Area (Vmean):   4.65 cm AV Area (VTI):     4.57 cm AV Vmax:           132.00 cm/s AV Vmean:          90.900 cm/s AV VTI:            0.229 m AV Peak Grad:      7.0 mmHg AV Mean Grad:      4.0 mmHg LVOT Vmax:         126.00 cm/s LVOT Vmean:        79.600 cm/s LVOT VTI:          0.197 m LVOT/AV VTI ratio: 0.86  AORTA Ao Root diam: 4.40 cm Ao Asc diam:  3.50 cm MITRAL VALVE               TRICUSPID VALVE MV Area (PHT): 4.21 cm    TR Peak grad:   39.7 mmHg MV Decel Time: 180 msec    TR Vmax:        315.00 cm/s MV E velocity: 61.10 cm/s MV A velocity: 71.30 cm/s  SHUNTS MV E/A ratio:  0.86        Systemic VTI:  0.20 m                            Systemic Diam: 2.60 cm Kirk Ruths MD  Electronically signed by Kirk Ruths MD Signature Date/Time: 11/30/2021/12:14:22 PM    Final    IR US Guide Vasc Access Right  Result Date: 11/29/2021 INDICATION: 71 year old male with submassive high-risk pulmonary embolism, presents for treatment EXAM: ULTRASOUND-GUIDED ACCESS RIGHT COMMON FEMORAL VEIN PULMONARY ANGIOGRAM MECHANICAL/ASPIRATION THROMBECTOMY OF BILATERAL  PULMONARY ARTERIES PULMONARY ARTERY PRESSURE MEASUREMENTS COMPARISON:  None MEDICATIONS: Eight thousand IV units heparin 500 cc normal saline bolus ANESTHESIA/SEDATION: Moderate (conscious) sedation was employed during this procedure. A total of Versed 1.0 mg and Fentanyl 25 mcg was administered intravenously by the radiology nurse. Total intra-service moderate Sedation Time: 106 minutes. The patient's level of consciousness and vital signs were monitored continuously by radiology nursing throughout the procedure under my direct supervision. FLUOROSCOPY: Radiation Exposure Index (as provided by the fluoroscopic device): 21 minutes, 18 seconds, [id number] mGy Kerma COMPLICATIONS: None TECHNIQUE: Informed consent was obtained from the patient and the patient's family following explanation of the procedure, risks, benefits and alternatives. Specific risks include bleeding, infection, contrast reaction, kidney injury, venous injury, life-threatening hemorrhage, need for further surgery, need for further procedure, cardiopulmonary collapse, death. The patient understands, agrees and consents for the procedure. All questions were addressed. Patient is positioned supine position on the fluoroscopy table. Maximal barrier sterile technique utilized including caps, mask, sterile gowns, sterile gloves, large sterile drape, hand hygiene, and chlorhexidine prep. 1% lidocaine used for local anesthesia. Moderate sedation was provided. Ultrasound survey of the right inguinal region was performed with images stored and sent to PACs, confirming patency of the right  common femoral vein. A single wall needle was used access the right common femoral vein under ultrasound. With excellent blood flow returned, 035 wire was passed through the needle, observed to enter the IVC under fluoroscopy. The needle was removed, and a standard 6 [place] vascular sheath was placed. The dilator was removed and the sheath was flushed. Combination of a angled pigtail catheter and a Bentson wire was then used to navigate to the right heart. Angled catheter used to select the main pulmonary artery. Wire was removed and angiogram was performed. Pressure measurement was obtained. Stiff Glidewire was then advanced through the pigtail catheter, reducing the curve and flipping into the left-sided pulmonary artery. The wire was straightened into the downgoing branches of the right pulmonary artery. Pigtail catheter was then removed. The 6 French sheath was removed. 71 French dilation was performed and then an 84 [place] Gore dry seal sheath was placed. Over the wire, a coaxial 125 cm H1 diagnostic catheter with 16 French H torque aspiration catheter were advanced into the downgoing branches of the left pulmonary arteries. The diagnostic catheter was removed, and aspiration was initiated with the 16 [place] device. After the initial aspiration attempt, there was some hemodynamic instability, with oxygen saturation dropping into the low [id number], systolic blood pressure dropping into the mid 60s, and the aspiration catheter became occluded. Stepwise removal of the aspiration catheter over the Glidewire was performed, ultimately with removal completely from the system as well as removal of the Gore dry seal sheath from the venous access to clear the catheter of retained thrombus. Once the catheter and the sheath were cleared of residual thrombus/blood products the dry seal sheath was advanced on the Glidewire to the level of the right atrium. Introducer was removed on the wire. The angled pigtail catheter was  then used to select the main pulmonary artery. Stiff Glidewire was a again advanced through the pigtail catheter, selecting the left-sided branches. Pigtail catheter was removed. Placement of the coaxial H torque 16 French device and the 125 cm catheter was again into the downgoing branches on the left. Once the aspiration catheter was in position the coaxial catheter was removed from the wire. Aspiration was then performed of the left-sided pulmonary artery and the segmental arteries into the  downgoing branches. Catheter was removed from the system for clearance. Once the sheath was clear to of all residual thrombus the pigtail catheter was advanced again through the right heart into the main pulmonary artery. A repeat pulmonary artery pressure was performed at this time. Repeat angiogram was performed at this time. Stiff Glidewire was then again advanced through the pigtail catheter into the right-sided branches. Pigtail catheter was removed. The coaxial diagnostic catheter and aspiration catheter were advanced into the right-sided branches. Catheter was removed on the wire. Aspiration thrombectomy was performed within the right-sided segmental branches. When the catheter became clogged with aspirated material, the aspiration catheter was removed from the sheath and cleared of all broad products. At this time the sheath was confirmed to be clogged with some retained blood products/thrombus. The sheath was removed on the stiff Glidewire. Both the aspiration catheter and the sheath were cleared of all blood products and thrombus material. The introducer was then placed through the Gore dry seal sheath and the sheath was reinserted on the Glidewire into the femoral vein. The sheath was position at the base of the right atrium. Introducer was removed. Pigtail catheter was then again advanced through the sheath into the main pulmonary artery. Final pressure measurement was performed. At this time the pressure  measurement had nearly normalized, to a mean pressure of 26. Additionally the patient's initial 15 L oxygen requirement was now decreased to 3 L for 100% oxygen saturation. We elected to terminate the case at this point. All catheters and wires were removed. The sheath was withdrawn and a stay suture was placed at the femoral vein puncture site. Manual pressure was performed for 20 minutes and then a compression dressing was added. The patient tolerated the procedure well. No complications were encountered. Estimated blood loss 300 cc. FINDINGS: Initial PA pressure: 54/35 (42) After left PA treatment: 42/30 (35) Final PA pressure after left & right treatment: 34/21 (26) IMPRESSION: Status post ultrasound guided access right common femoral vein for pulmonary angiogram/pressure measurements and mechanical/aspiration thrombectomy of bilateral pulmonary emboli for high risk submassive PE presentation. Signed, Dulcy Fanny. Nadene Rubins, RPVI [facility] Radiology Electronically Signed   By: Corrie Mckusick D.O.   On: 11/29/2021 15:19   IR THROMBECT PRIM MECH INIT (INCLU) MOD SED  Result Date: 11/29/2021 INDICATION: 71 year old male with submassive high-risk pulmonary embolism, presents for treatment EXAM: ULTRASOUND-GUIDED ACCESS RIGHT COMMON FEMORAL VEIN PULMONARY ANGIOGRAM MECHANICAL/ASPIRATION THROMBECTOMY OF BILATERAL PULMONARY ARTERIES PULMONARY ARTERY PRESSURE MEASUREMENTS COMPARISON:  None MEDICATIONS: Eight thousand IV units heparin 500 cc normal saline bolus ANESTHESIA/SEDATION: Moderate (conscious) sedation was employed during this procedure. A total of Versed 1.0 mg and Fentanyl 25 mcg was administered intravenously by the radiology nurse. Total intra-service moderate Sedation Time: 106 minutes. The patient's level of consciousness and vital signs were monitored continuously by radiology nursing throughout the procedure under my direct supervision. FLUOROSCOPY:  Radiation Exposure Index (as provided by the fluoroscopic device): 21 minutes, 18 seconds, [id number] mGy Kerma COMPLICATIONS: None TECHNIQUE: Informed consent was obtained from the patient and the patient's family following explanation of the procedure, risks, benefits and alternatives. Specific risks include bleeding, infection, contrast reaction, kidney injury, venous injury, life-threatening hemorrhage, need for further surgery, need for further procedure, cardiopulmonary collapse, death. The patient understands, agrees and consents for the procedure. All questions were addressed. Patient is positioned supine position on the fluoroscopy table. Maximal barrier sterile technique utilized including caps, mask, sterile gowns, sterile gloves, large sterile drape,  hand hygiene, and chlorhexidine prep. 1% lidocaine used for local anesthesia. Moderate sedation was provided. Ultrasound survey of the right inguinal region was performed with images stored and sent to PACs, confirming patency of the right common femoral vein. A single wall needle was used access the right common femoral vein under ultrasound. With excellent blood flow returned, 035 wire was passed through the needle, observed to enter the IVC under fluoroscopy. The needle was removed, and a standard 6 [place] vascular sheath was placed. The dilator was removed and the sheath was flushed. Combination of a angled pigtail catheter and a Bentson wire was then used to navigate to the right heart. Angled catheter used to select the main pulmonary artery. Wire was removed and angiogram was performed. Pressure measurement was obtained. Stiff Glidewire was then advanced through the pigtail catheter, reducing the curve and flipping into the left-sided pulmonary artery. The wire was straightened into the downgoing branches of the right pulmonary artery. Pigtail catheter was then removed. The 6 French sheath was removed. 96 French dilation was performed and then an 28  [place] Gore dry seal sheath was placed. Over the wire, a coaxial 125 cm H1 diagnostic catheter with 16 French H torque aspiration catheter were advanced into the downgoing branches of the left pulmonary arteries. The diagnostic catheter was removed, and aspiration was initiated with the 16 [place] device. After the initial aspiration attempt, there was some hemodynamic instability, with oxygen saturation dropping into the low 123XX123, systolic blood pressure dropping into the mid 60s, and the aspiration catheter became occluded. Stepwise removal of the aspiration catheter over the Glidewire was performed, ultimately with removal completely from the system as well as removal of the Gore dry seal sheath from the venous access to clear the catheter of retained thrombus. Once the catheter and the sheath were cleared of residual thrombus/blood products the dry seal sheath was advanced on the Glidewire to the level of the right atrium. Introducer was removed on the wire. The angled pigtail catheter was then used to select the main pulmonary artery. Stiff Glidewire was a again advanced through the pigtail catheter, selecting the left-sided branches. Pigtail catheter was removed. Placement of the coaxial H torque 16 French device and the 125 cm catheter was again into the downgoing branches on the left. Once the aspiration catheter was in position the coaxial catheter was removed from the wire. Aspiration was then performed of the left-sided pulmonary artery and the segmental arteries into the downgoing branches. Catheter was removed from the system for clearance. Once the sheath was clear to of all residual thrombus the pigtail catheter was advanced again through the right heart into the main pulmonary artery. A repeat pulmonary artery pressure was performed at this time. Repeat angiogram was performed at this time. Stiff Glidewire was then again advanced through the pigtail catheter into the right-sided branches. Pigtail  catheter was removed. The coaxial diagnostic catheter and aspiration catheter were advanced into the right-sided branches. Catheter was removed on the wire. Aspiration thrombectomy was performed within the right-sided segmental branches. When the catheter became clogged with aspirated material, the aspiration catheter was removed from the sheath and cleared of all broad products. At this time the sheath was confirmed to be clogged with some retained blood products/thrombus. The sheath was removed on the stiff Glidewire. Both the aspiration catheter and the sheath were cleared of all blood products and thrombus material. The introducer was then placed through the Gore dry seal sheath and the sheath was reinserted  on the Glidewire into the femoral vein. The sheath was position at the base of the right atrium. Introducer was removed. Pigtail catheter was then again advanced through the sheath into the main pulmonary artery. Final pressure measurement was performed. At this time the pressure measurement had nearly normalized, to a mean pressure of 26. Additionally the patient's initial 15 L oxygen requirement was now decreased to 3 L for 100% oxygen saturation. We elected to terminate the case at this point. All catheters and wires were removed. The sheath was withdrawn and a stay suture was placed at the femoral vein puncture site. Manual pressure was performed for 20 minutes and then a compression dressing was added. The patient tolerated the procedure well. No complications were encountered. Estimated blood loss 300 cc. FINDINGS: Initial PA pressure: 54/35 (42) After left PA treatment: 42/30 (35) Final PA pressure after left & right treatment: 34/21 (26) IMPRESSION: Status post ultrasound guided access right common femoral vein for pulmonary angiogram/pressure measurements and mechanical/aspiration thrombectomy of bilateral pulmonary emboli for high risk submassive PE presentation. Signed, Dulcy Fanny. Nadene Rubins,  RPVI [facility] Radiology Electronically Signed   By: Corrie Mckusick D.O.   On: 11/29/2021 15:19   IR THROMBECT PRIM MECH INIT (INCLU) MOD SED  Result Date: 11/29/2021 INDICATION: 71 year old male with submassive high-risk pulmonary embolism, presents for treatment EXAM: ULTRASOUND-GUIDED ACCESS RIGHT COMMON FEMORAL VEIN PULMONARY ANGIOGRAM MECHANICAL/ASPIRATION THROMBECTOMY OF BILATERAL PULMONARY ARTERIES PULMONARY ARTERY PRESSURE MEASUREMENTS COMPARISON:  None MEDICATIONS: Eight thousand IV units heparin 500 cc normal saline bolus ANESTHESIA/SEDATION: Moderate (conscious) sedation was employed during this procedure. A total of Versed 1.0 mg and Fentanyl 25 mcg was administered intravenously by the radiology nurse. Total intra-service moderate Sedation Time: 106 minutes. The patient's level of consciousness and vital signs were monitored continuously by radiology nursing throughout the procedure under my direct supervision. FLUOROSCOPY: Radiation Exposure Index (as provided by the fluoroscopic device): 21 minutes, 18 seconds, [id number] mGy Kerma COMPLICATIONS: None TECHNIQUE: Informed consent was obtained from the patient and the patient's family following explanation of the procedure, risks, benefits and alternatives. Specific risks include bleeding, infection, contrast reaction, kidney injury, venous injury, life-threatening hemorrhage, need for further surgery, need for further procedure, cardiopulmonary collapse, death. The patient understands, agrees and consents for the procedure. All questions were addressed. Patient is positioned supine position on the fluoroscopy table. Maximal barrier sterile technique utilized including caps, mask, sterile gowns, sterile gloves, large sterile drape, hand hygiene, and chlorhexidine prep. 1% lidocaine used for local anesthesia. Moderate sedation was provided. Ultrasound survey of the right inguinal region was performed with  images stored and sent to PACs, confirming patency of the right common femoral vein. A single wall needle was used access the right common femoral vein under ultrasound. With excellent blood flow returned, 035 wire was passed through the needle, observed to enter the IVC under fluoroscopy. The needle was removed, and a standard 6 [place] vascular sheath was placed. The dilator was removed and the sheath was flushed. Combination of a angled pigtail catheter and a Bentson wire was then used to navigate to the right heart. Angled catheter used to select the main pulmonary artery. Wire was removed and angiogram was performed. Pressure measurement was obtained. Stiff Glidewire was then advanced through the pigtail catheter, reducing the curve and flipping into the left-sided pulmonary artery. The wire was straightened into the downgoing branches of the right pulmonary artery. Pigtail catheter was then removed. The 6 French sheath was  removed. 61 French dilation was performed and then an 28 [place] Gore dry seal sheath was placed. Over the wire, a coaxial 125 cm H1 diagnostic catheter with 16 French H torque aspiration catheter were advanced into the downgoing branches of the left pulmonary arteries. The diagnostic catheter was removed, and aspiration was initiated with the 16 [place] device. After the initial aspiration attempt, there was some hemodynamic instability, with oxygen saturation dropping into the low 123XX123, systolic blood pressure dropping into the mid 60s, and the aspiration catheter became occluded. Stepwise removal of the aspiration catheter over the Glidewire was performed, ultimately with removal completely from the system as well as removal of the [place] dry seal sheath from the venous access to clear the catheter of retained thrombus. Once the catheter and the sheath were cleared of residual thrombus/blood products the dry seal sheath was advanced on the Glidewire to the level of the right atrium.  Introducer was removed on the wire. The angled pigtail catheter was then used to select the main pulmonary artery. Stiff Glidewire was a again advanced through the pigtail catheter, selecting the left-sided branches. Pigtail catheter was removed. Placement of the coaxial H torque 16 French device and the 125 cm catheter was again into the downgoing branches on the left. Once the aspiration catheter was in position the coaxial catheter was removed from the wire. Aspiration was then performed of the left-sided pulmonary artery and the segmental arteries into the downgoing branches. Catheter was removed from the system for clearance. Once the sheath was clear to of all residual thrombus the pigtail catheter was advanced again through the right heart into the main pulmonary artery. A repeat pulmonary artery pressure was performed at this time. Repeat angiogram was performed at this time. Stiff Glidewire was then again advanced through the pigtail catheter into the right-sided branches. Pigtail catheter was removed. The coaxial diagnostic catheter and aspiration catheter were advanced into the right-sided branches. Catheter was removed on the wire. Aspiration thrombectomy was performed within the right-sided segmental branches. When the catheter became clogged with aspirated material, the aspiration catheter was removed from the sheath and cleared of all broad products. At this time the sheath was confirmed to be clogged with some retained blood products/thrombus. The sheath was removed on the stiff Glidewire. Both the aspiration catheter and the sheath were cleared of all blood products and thrombus material. The introducer was then placed through the Gore dry seal sheath and the sheath was reinserted on the Glidewire into the femoral vein. The sheath was position at the base of the right atrium. Introducer was removed. Pigtail catheter was then again advanced through the sheath into the main pulmonary artery. Final  pressure measurement was performed. At this time the pressure measurement had nearly normalized, to a mean pressure of 26. Additionally the patient's initial 15 L oxygen requirement was now decreased to 3 L for 100% oxygen saturation. We elected to terminate the case at this point. All catheters and wires were removed. The sheath was withdrawn and a stay suture was placed at the femoral vein puncture site. Manual pressure was performed for 20 minutes and then a compression dressing was added. The patient tolerated the procedure well. No complications were encountered. Estimated blood loss 300 cc. FINDINGS: Initial PA pressure: 54/35 (42) After left PA treatment: 42/30 (35) Final PA pressure after left & right treatment: 34/21 (26) IMPRESSION: Status post ultrasound guided access right common femoral vein for pulmonary angiogram/pressure measurements and mechanical/aspiration thrombectomy of bilateral pulmonary  emboli for high risk submassive PE presentation. Signed, Dulcy Fanny. Nadene Rubins, RPVI [facility] Radiology Electronically Signed   By: Corrie Mckusick D.O.   On: 11/29/2021 15:19   IR Angiogram Pulmonary Bilateral Selective  Result Date: 11/29/2021 INDICATION: 71 year old male with submassive high-risk pulmonary embolism, presents for treatment EXAM: ULTRASOUND-GUIDED ACCESS RIGHT COMMON FEMORAL VEIN PULMONARY ANGIOGRAM MECHANICAL/ASPIRATION THROMBECTOMY OF BILATERAL PULMONARY ARTERIES PULMONARY ARTERY PRESSURE MEASUREMENTS COMPARISON:  None MEDICATIONS: Eight thousand IV units heparin 500 cc normal saline bolus ANESTHESIA/SEDATION: Moderate (conscious) sedation was employed during this procedure. A total of Versed 1.0 mg and Fentanyl 25 mcg was administered intravenously by the radiology nurse. Total intra-service moderate Sedation Time: 106 minutes. The patient's level of consciousness and vital signs were monitored continuously by radiology nursing  throughout the procedure under my direct supervision. FLUOROSCOPY: Radiation Exposure Index (as provided by the fluoroscopic device): 21 minutes, 18 seconds, [id number] mGy Kerma COMPLICATIONS: None TECHNIQUE: Informed consent was obtained from the patient and the patient's family following explanation of the procedure, risks, benefits and alternatives. Specific risks include bleeding, infection, contrast reaction, kidney injury, venous injury, life-threatening hemorrhage, need for further surgery, need for further procedure, cardiopulmonary collapse, death. The patient understands, agrees and consents for the procedure. All questions were addressed. Patient is positioned supine position on the fluoroscopy table. Maximal barrier sterile technique utilized including caps, mask, sterile gowns, sterile gloves, large sterile drape, hand hygiene, and chlorhexidine prep. 1% lidocaine used for local anesthesia. Moderate sedation was provided. Ultrasound survey of the right inguinal region was performed with images stored and sent to PACs, confirming patency of the right common femoral vein. A single wall needle was used access the right common femoral vein under ultrasound. With excellent blood flow returned, 035 wire was passed through the needle, observed to enter the IVC under fluoroscopy. The needle was removed, and a standard 6 [place] vascular sheath was placed. The dilator was removed and the sheath was flushed. Combination of a angled pigtail catheter and a Bentson wire was then used to navigate to the right heart. Angled catheter used to select the main pulmonary artery. Wire was removed and angiogram was performed. Pressure measurement was obtained. Stiff Glidewire was then advanced through the pigtail catheter, reducing the curve and flipping into the left-sided pulmonary artery. The wire was straightened into the downgoing branches of the right pulmonary artery. Pigtail catheter was then removed. The 6 French sheath  was removed. 32 French dilation was performed and then an 20 [place] Gore dry seal sheath was placed. Over the wire, a coaxial 125 cm H1 diagnostic catheter with 16 [place] H torque aspiration catheter were advanced into the downgoing branches of the left pulmonary arteries. The diagnostic catheter was removed, and aspiration was initiated with the 16 [place] device. After the initial aspiration attempt, there was some hemodynamic instability, with oxygen saturation dropping into the low 123XX123, systolic blood pressure dropping into the mid 60s, and the aspiration catheter became occluded. Stepwise removal of the aspiration catheter over the Glidewire was performed, ultimately with removal completely from the system as well as removal of the [place] dry seal sheath from the venous access to clear the catheter of retained thrombus. Once the catheter and the sheath were cleared of residual thrombus/blood products the dry seal sheath was advanced on the Glidewire to the level of the right atrium. Introducer was removed on the wire. The angled pigtail catheter was then used to select the main pulmonary artery. Stiff  Glidewire was a again advanced through the pigtail catheter, selecting the left-sided branches. Pigtail catheter was removed. Placement of the coaxial H torque 16 French device and the 125 cm catheter was again into the downgoing branches on the left. Once the aspiration catheter was in position the coaxial catheter was removed from the wire. Aspiration was then performed of the left-sided pulmonary artery and the segmental arteries into the downgoing branches. Catheter was removed from the system for clearance. Once the sheath was clear to of all residual thrombus the pigtail catheter was advanced again through the right heart into the main pulmonary artery. A repeat pulmonary artery pressure was performed at this time. Repeat angiogram was performed at this time. Stiff Glidewire was then again advanced through  the pigtail catheter into the right-sided branches. Pigtail catheter was removed. The coaxial diagnostic catheter and aspiration catheter were advanced into the right-sided branches. Catheter was removed on the wire. Aspiration thrombectomy was performed within the right-sided segmental branches. When the catheter became clogged with aspirated material, the aspiration catheter was removed from the sheath and cleared of all broad products. At this time the sheath was confirmed to be clogged with some retained blood products/thrombus. The sheath was removed on the stiff Glidewire. Both the aspiration catheter and the sheath were cleared of all blood products and thrombus material. The introducer was then placed through the [place] dry seal sheath and the sheath was reinserted on the Glidewire into the femoral vein. The sheath was position at the base of the right atrium. Introducer was removed. Pigtail catheter was then again advanced through the sheath into the main pulmonary artery. Final pressure measurement was performed. At this time the pressure measurement had nearly normalized, to a mean pressure of 26. Additionally the patient's initial 15 L oxygen requirement was now decreased to 3 L for 100% oxygen saturation. We elected to terminate the case at this point. All catheters and wires were removed. The sheath was withdrawn and a stay suture was placed at the femoral vein puncture site. Manual pressure was performed for 20 minutes and then a compression dressing was added. The patient tolerated the procedure well. No complications were encountered. Estimated blood loss 300 cc. FINDINGS: Initial PA pressure: 54/35 (42) After left PA treatment: 42/30 (35) Final PA pressure after left & right treatment: 34/21 (26) IMPRESSION: Status post ultrasound guided access right common femoral vein for pulmonary angiogram/pressure measurements and mechanical/aspiration thrombectomy of bilateral pulmonary emboli for high risk  submassive PE presentation. Signed, Yvone Neu. Miachel Roux, RPVI [facility] Radiology Electronically Signed   By: Gilmer Mor D.O.   On: 11/29/2021 15:19   IR Angiogram Selective Each Additional Vessel  Result Date: 11/29/2021 INDICATION: 71 year old male with submassive high-risk pulmonary embolism, presents for treatment EXAM: ULTRASOUND-GUIDED ACCESS RIGHT COMMON FEMORAL VEIN PULMONARY ANGIOGRAM MECHANICAL/ASPIRATION THROMBECTOMY OF BILATERAL PULMONARY ARTERIES PULMONARY ARTERY PRESSURE MEASUREMENTS COMPARISON:  None MEDICATIONS: Eight thousand IV units heparin 500 cc normal saline bolus ANESTHESIA/SEDATION: Moderate (conscious) sedation was employed during this procedure. A total of Versed 1.0 mg and Fentanyl 25 mcg was administered intravenously by the radiology nurse. Total intra-service moderate Sedation Time: 106 minutes. The patient's level of consciousness and vital signs were monitored continuously by radiology nursing throughout the procedure under my direct supervision. FLUOROSCOPY: Radiation Exposure Index (as provided by the fluoroscopic device): 21 minutes, 18 seconds, 219 mGy Kerma COMPLICATIONS: None TECHNIQUE: Informed consent was obtained from the patient and the patient's family following explanation  of the procedure, risks, benefits and alternatives. Specific risks include bleeding, infection, contrast reaction, kidney injury, venous injury, life-threatening hemorrhage, need for further surgery, need for further procedure, cardiopulmonary collapse, death. The patient understands, agrees and consents for the procedure. All questions were addressed. Patient is positioned supine position on the fluoroscopy table. Maximal barrier sterile technique utilized including caps, mask, sterile gowns, sterile gloves, large sterile drape, hand hygiene, and chlorhexidine prep. 1% lidocaine used for local anesthesia. Moderate sedation was provided.  Ultrasound survey of the right inguinal region was performed with images stored and sent to PACs, confirming patency of the right common femoral vein. A single wall needle was used access the right common femoral vein under ultrasound. With excellent blood flow returned, 035 wire was passed through the needle, observed to enter the IVC under fluoroscopy. The needle was removed, and a standard 6 [place] vascular sheath was placed. The dilator was removed and the sheath was flushed. Combination of a angled pigtail catheter and a Bentson wire was then used to navigate to the right heart. Angled catheter used to select the main pulmonary artery. Wire was removed and angiogram was performed. Pressure measurement was obtained. Stiff Glidewire was then advanced through the pigtail catheter, reducing the curve and flipping into the left-sided pulmonary artery. The wire was straightened into the downgoing branches of the right pulmonary artery. Pigtail catheter was then removed. The 6 French sheath was removed. 47 French dilation was performed and then an 80 [place] Gore dry seal sheath was placed. Over the wire, a coaxial 125 cm H1 diagnostic catheter with 16 [place] H torque aspiration catheter were advanced into the downgoing branches of the left pulmonary arteries. The diagnostic catheter was removed, and aspiration was initiated with the 16 [place] device. After the initial aspiration attempt, there was some hemodynamic instability, with oxygen saturation dropping into the low 123XX123, systolic blood pressure dropping into the mid 60s, and the aspiration catheter became occluded. Stepwise removal of the aspiration catheter over the Glidewire was performed, ultimately with removal completely from the system as well as removal of the [place] dry seal sheath from the venous access to clear the catheter of retained thrombus. Once the catheter and the sheath were cleared of residual thrombus/blood products the dry seal sheath was  advanced on the Glidewire to the level of the right atrium. Introducer was removed on the wire. The angled pigtail catheter was then used to select the main pulmonary artery. Stiff Glidewire was a again advanced through the pigtail catheter, selecting the left-sided branches. Pigtail catheter was removed. Placement of the coaxial H torque 16 [place] device and the 125 cm catheter was again into the downgoing branches on the left. Once the aspiration catheter was in position the coaxial catheter was removed from the wire. Aspiration was then performed of the left-sided pulmonary artery and the segmental arteries into the downgoing branches. Catheter was removed from the system for clearance. Once the sheath was clear to of all residual thrombus the pigtail catheter was advanced again through the right heart into the main pulmonary artery. A repeat pulmonary artery pressure was performed at this time. Repeat angiogram was performed at this time. Stiff Glidewire was then again advanced through the pigtail catheter into the right-sided branches. Pigtail catheter was removed. The coaxial diagnostic catheter and aspiration catheter were advanced into the right-sided branches. Catheter was removed on the wire. Aspiration thrombectomy was performed within the right-sided segmental branches. When the catheter became clogged with aspirated material, the  aspiration catheter was removed from the sheath and cleared of all broad products. At this time the sheath was confirmed to be clogged with some retained blood products/thrombus. The sheath was removed on the stiff Glidewire. Both the aspiration catheter and the sheath were cleared of all blood products and thrombus material. The introducer was then placed through the Gore dry seal sheath and the sheath was reinserted on the Glidewire into the femoral vein. The sheath was position at the base of the right atrium. Introducer was removed. Pigtail catheter was then again advanced  through the sheath into the main pulmonary artery. Final pressure measurement was performed. At this time the pressure measurement had nearly normalized, to a mean pressure of 26. Additionally the patient's initial 15 L oxygen requirement was now decreased to 3 L for 100% oxygen saturation. We elected to terminate the case at this point. All catheters and wires were removed. The sheath was withdrawn and a stay suture was placed at the femoral vein puncture site. Manual pressure was performed for 20 minutes and then a compression dressing was added. The patient tolerated the procedure well. No complications were encountered. Estimated blood loss 300 cc. FINDINGS: Initial PA pressure: 54/35 (42) After left PA treatment: 42/30 (35) Final PA pressure after left & right treatment: 34/21 (26) IMPRESSION: Status post ultrasound guided access right common femoral vein for pulmonary angiogram/pressure measurements and mechanical/aspiration thrombectomy of bilateral pulmonary emboli for high risk submassive PE presentation. Signed, Dulcy Fanny. Nadene Rubins, RPVI [facility] Radiology Electronically Signed   By: Corrie Mckusick D.O.   On: 11/29/2021 15:19   IR Angiogram Selective Each Additional Vessel  Result Date: 11/29/2021 INDICATION: 71 year old male with submassive high-risk pulmonary embolism, presents for treatment EXAM: ULTRASOUND-GUIDED ACCESS RIGHT COMMON FEMORAL VEIN PULMONARY ANGIOGRAM MECHANICAL/ASPIRATION THROMBECTOMY OF BILATERAL PULMONARY ARTERIES PULMONARY ARTERY PRESSURE MEASUREMENTS COMPARISON:  None MEDICATIONS: Eight thousand IV units heparin 500 cc normal saline bolus ANESTHESIA/SEDATION: Moderate (conscious) sedation was employed during this procedure. A total of Versed 1.0 mg and Fentanyl 25 mcg was administered intravenously by the radiology nurse. Total intra-service moderate Sedation Time: 106 minutes. The patient's level of consciousness and vital  signs were monitored continuously by radiology nursing throughout the procedure under my direct supervision. FLUOROSCOPY: Radiation Exposure Index (as provided by the fluoroscopic device): 21 minutes, 18 seconds, [id number] mGy Kerma COMPLICATIONS: None TECHNIQUE: Informed consent was obtained from the patient and the patient's family following explanation of the procedure, risks, benefits and alternatives. Specific risks include bleeding, infection, contrast reaction, kidney injury, venous injury, life-threatening hemorrhage, need for further surgery, need for further procedure, cardiopulmonary collapse, death. The patient understands, agrees and consents for the procedure. All questions were addressed. Patient is positioned supine position on the fluoroscopy table. Maximal barrier sterile technique utilized including caps, mask, sterile gowns, sterile gloves, large sterile drape, hand hygiene, and chlorhexidine prep. 1% lidocaine used for local anesthesia. Moderate sedation was provided. Ultrasound survey of the right inguinal region was performed with images stored and sent to PACs, confirming patency of the right common femoral vein. A single wall needle was used access the right common femoral vein under ultrasound. With excellent blood flow returned, 035 wire was passed through the needle, observed to enter the IVC under fluoroscopy. The needle was removed, and a standard 6 [place] vascular sheath was placed. The dilator was removed and the sheath was flushed. Combination of a angled pigtail catheter and a Bentson wire was then used to navigate  to the right heart. Angled catheter used to select the main pulmonary artery. Wire was removed and angiogram was performed. Pressure measurement was obtained. Stiff Glidewire was then advanced through the pigtail catheter, reducing the curve and flipping into the left-sided pulmonary artery. The wire was straightened into the downgoing branches of the right pulmonary artery.  Pigtail catheter was then removed. The 6 French sheath was removed. 50 French dilation was performed and then an 12 [place] Gore dry seal sheath was placed. Over the wire, a coaxial 125 cm H1 diagnostic catheter with 16 French H torque aspiration catheter were advanced into the downgoing branches of the left pulmonary arteries. The diagnostic catheter was removed, and aspiration was initiated with the 16 [place] device. After the initial aspiration attempt, there was some hemodynamic instability, with oxygen saturation dropping into the low [id number], systolic blood pressure dropping into the mid 60s, and the aspiration catheter became occluded. Stepwise removal of the aspiration catheter over the Glidewire was performed, ultimately with removal completely from the system as well as removal of the Gore dry seal sheath from the venous access to clear the catheter of retained thrombus. Once the catheter and the sheath were cleared of residual thrombus/blood products the dry seal sheath was advanced on the Glidewire to the level of the right atrium. Introducer was removed on the wire. The angled pigtail catheter was then used to select the main pulmonary artery. Stiff Glidewire was a again advanced through the pigtail catheter, selecting the left-sided branches. Pigtail catheter was removed. Placement of the coaxial H torque 16 French device and the 125 cm catheter was again into the downgoing branches on the left. Once the aspiration catheter was in position the coaxial catheter was removed from the wire. Aspiration was then performed of the left-sided pulmonary artery and the segmental arteries into the downgoing branches. Catheter was removed from the system for clearance. Once the sheath was clear to of all residual thrombus the pigtail catheter was advanced again through the right heart into the main pulmonary artery. A repeat pulmonary artery pressure was performed at this time. Repeat angiogram was performed at  this time. Stiff Glidewire was then again advanced through the pigtail catheter into the right-sided branches. Pigtail catheter was removed. The coaxial diagnostic catheter and aspiration catheter were advanced into the right-sided branches. Catheter was removed on the wire. Aspiration thrombectomy was performed within the right-sided segmental branches. When the catheter became clogged with aspirated material, the aspiration catheter was removed from the sheath and cleared of all broad products. At this time the sheath was confirmed to be clogged with some retained blood products/thrombus. The sheath was removed on the stiff Glidewire. Both the aspiration catheter and the sheath were cleared of all blood products and thrombus material. The introducer was then placed through the Gore dry seal sheath and the sheath was reinserted on the Glidewire into the femoral vein. The sheath was position at the base of the right atrium. Introducer was removed. Pigtail catheter was then again advanced through the sheath into the main pulmonary artery. Final pressure measurement was performed. At this time the pressure measurement had nearly normalized, to a mean pressure of 26. Additionally the patient's initial 15 L oxygen requirement was now decreased to 3 L for 100% oxygen saturation. We elected to terminate the case at this point. All catheters and wires were removed. The sheath was withdrawn and a stay suture was placed at the femoral vein puncture site. Manual pressure was performed for  20 minutes and then a compression dressing was added. The patient tolerated the procedure well. No complications were encountered. Estimated blood loss 300 cc. FINDINGS: Initial PA pressure: 54/35 (42) After left PA treatment: 42/30 (35) Final PA pressure after left & right treatment: 34/21 (26) IMPRESSION: Status post ultrasound guided access right common femoral vein for pulmonary angiogram/pressure measurements and mechanical/aspiration  thrombectomy of bilateral pulmonary emboli for high risk submassive PE presentation. Signed, Dulcy Fanny. Nadene Rubins, RPVI Vascular and Interventional Radiology Specialists [facility] Radiology Electronically Signed   By: Corrie Mckusick D.O.   On: 11/29/2021 15:19   CT Angio Chest PE W and/or Wo Contrast  Result Date: 11/29/2021 CLINICAL DATA:  Syncopal episode with loss of consciousness and fall injury. EXAM: CT ANGIOGRAPHY CHEST WITH CONTRAST TECHNIQUE: Multidetector CT imaging of the chest was performed using the standard protocol during bolus administration of intravenous contrast. Multiplanar CT image reconstructions and MIPs were obtained to evaluate the vascular anatomy. RADIATION DOSE REDUCTION: This exam was performed according to the departmental dose-optimization program which includes automated exposure control, adjustment of the mA and/or kV according to patient size and/or use of iterative reconstruction technique. CONTRAST:  178mL OMNIPAQUE IOHEXOL 350 MG/ML SOLN COMPARISON:  Portable chest today, portable chest 03/07/2020, chest CT with contrast 12/01/2008. FINDINGS: Cardiovascular: There is mild cardiomegaly, no pericardial effusion and no visible coronary calcifications. There is elevated RV LV ratio of 1.49 and IVC reflux indicating right heart strain, prominent pulmonary trunk and main arteries with the pulmonary trunk 3.5 cm, and a large bilateral pulmonary arterial embolic burden. There is a band embolus partially filling the left main pulmonary artery, smaller band of embolus in the distal right main pulmonary artery. In the upper lobes there are multiple partially occlusive segmental and subsegmental emboli. In the right lower lobe there are nonocclusive segmental and scattered occlusive subsegmental emboli to the basal segments, segmental embolus to the right lower lobe superior segment, and multiple segmental and subsegmental occlusive emboli in the left lower lobe. There is a  nonocclusive embolus in the right middle lobe interlobar artery. There is aortic tortuosity and moderate patchy atherosclerosis, scattered plaques in the great vessels without stenosis or dissection. Dilatation of the aortic root is seen up to 4.5 cm at the sinuses of Valsalva. The rest is within normal caliber limits. Pulmonary veins are normal caliber. Mediastinum/Nodes: No enlarged mediastinal, hilar, or axillary lymph nodes. Thyroid gland, trachea, and esophagus demonstrate no significant findings. Lungs/Pleura: There are patchy peripheral ground-glass opacities in the left upper and both lower lobes, moderate posterior atelectasis in the lower lobes. There is no pleural effusion, thickening or pneumothorax. Mild bronchial thickening in the lower lobes. Upper Abdomen: No acute finding. Small stones layer in the gallbladder. Musculoskeletal: No regional skeletal fracture is seen. Review of the MIP images confirms the above findings. IMPRESSION: Bilateral acute arterial emboli with large clot burden and right heart strain findings. Positive for acute PE with CT evidence of right heart strain (RV/LV Ratio = 1.49) consistent with at least submassive (intermediate risk) PE. The presence of right heart strain has been associated with an increased risk of morbidity and mortality. Please refer to the "Code PE Focused" order set in EPIC. Aortic atherosclerosis with 4.5 cm dilatation of the aortic root. Annual CTA or MRA follow-up and vascular surgery consult recommended. Mild cardiomegaly. 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 Background bronchitis. Cholelithiasis. Results phoned to Dr. Langston Masker  in the ED at 7:48 a.m., 11/29/2021 with verbal acknowledgement of key findings. Electronically Signed   By: Telford Nab M.D.   On: 11/29/2021 08:05   CT Cervical Spine Wo Contrast  Result Date: 11/29/2021 CLINICAL DATA:  Fall getting  into truck.  Trauma to head. EXAM: CT CERVICAL SPINE WITHOUT CONTRAST TECHNIQUE: Multidetector CT imaging of the cervical spine was performed without intravenous contrast. Multiplanar CT image reconstructions were also generated. RADIATION DOSE REDUCTION: This exam was performed according to the departmental dose-optimization program which includes automated exposure control, adjustment of the mA and/or kV according to patient size and/or use of iterative reconstruction technique. COMPARISON:  None Available. FINDINGS: Alignment: Slight retrolisthesis at C3-4 is degenerative. No traumatic listhesis is present. Straightening of the normal cervical lordosis is likely chronic. Skull base and vertebrae: Craniocervical junction is within normal limits. No acute fractures are present. Soft tissues and spinal canal: No prevertebral fluid or swelling. No visible canal hematoma. Disc levels: Moderate to severe left foraminal stenosis present at C3-4 and C4-5 due to uncovertebral and facet spurring. Moderate bilateral foraminal narrowing scratched at severe right and moderate left foraminal narrowing is present at C5-6. Upper chest: The lung apices are clear. Thoracic inlet is within normal limits. IMPRESSION: 1. No acute fracture or traumatic listhesis. 2. Multilevel degenerative disc disease as described. These results were called by telephone at the time of interpretation on 11/29/2021 at 8:04 am to provider Dr. Langston Masker, who verbally acknowledged these results. Electronically Signed   By: San Morelle M.D.   On: 11/29/2021 08:05   CT Head Wo Contrast  Result Date: 11/29/2021 CLINICAL DATA:  Head trauma. [place] getting into his truck. Abrasion to left side of forehead and cheek. EXAM: CT HEAD WITHOUT CONTRAST TECHNIQUE: Contiguous axial images were obtained from the base of the skull through the vertex without intravenous contrast. RADIATION DOSE REDUCTION: This exam was performed according to the departmental  dose-optimization program which includes automated exposure control, adjustment of the mA and/or kV according to patient size and/or use of iterative reconstruction technique. COMPARISON:  None Available. FINDINGS: Brain: No acute infarct, hemorrhage, or mass lesion is present. Atrophy and white matter changes are mildly advanced for age. Basal ganglia are intact. No acute or focal cortical abnormalities are present. The ventricles are of normal size. No significant extraaxial fluid collection is present. 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 The splenium of the corpus callosum is incompletely formed. No other midline abnormalities are present. The brainstem and cerebellum are within normal limits. Vascular: No hyperdense vessel or unexpected calcification. Skull: A left supraorbital and frontal scalp laceration and hematoma is present. No underlying fracture or foreign body is present. Soft tissue swelling extends along the lateral left orbit and over the left maxilla. The zygomatic arch is intact. Sinuses/Orbits: The paranasal sinuses and mastoid air cells are clear. The globes and orbits are within normal limits. IMPRESSION: 1. Left supraorbital and frontal scalp laceration and hematoma without underlying fracture or foreign body. 2. Soft tissue swelling extends along the lateral left orbit and over the left maxilla. 3. No acute intracranial abnormality. 4. Atrophy and white matter changes are mildly advanced for age. This likely reflects the sequela of chronic microvascular ischemia. These results were called by telephone at the time of interpretation on 11/29/2021 at 8:01 am to provider Dr. Langston Masker, who verbally acknowledged these results. Electronically Signed   By: San Morelle M.D.   On: 11/29/2021 08:02   CT  ABDOMEN PELVIS W CONTRAST  Result Date: 11/29/2021 CLINICAL DATA:  Larey Seat getting into truck today.  Trauma to head. EXAM: CT ABDOMEN AND PELVIS WITH CONTRAST TECHNIQUE:  Multidetector CT imaging of the abdomen and pelvis was performed using the standard protocol following bolus administration of intravenous contrast. RADIATION DOSE REDUCTION: This exam was performed according to the departmental dose-optimization program which includes automated exposure control, adjustment of the mA and/or kV according to patient size and/or use of iterative reconstruction technique. CONTRAST:  OMNIPAQUE IOHEXOL 350 MG/ML SOLN COMPARISON:  CT a of the chest 11/29/2021. CT abdomen pelvis 12/10/2008 FINDINGS: Lower chest: Pulmonary artery are noted at the scratched at pulmonary emboli are noted in the right main outflow tract and left lower lobar pulmonary arteries. Right heart strain is evident. Minimal scarring or atelectasis is present at both lung bases. No pleural effusion is present. Hepatobiliary: Small layering stones are present at the neck of the gallbladder. No inflammatory changes are present. Liver is unremarkable. No acute trauma. Pancreas: Unremarkable. No pancreatic ductal dilatation or surrounding inflammatory changes. Spleen: No splenic injury or perisplenic hematoma. Adrenals/Urinary Tract: No adrenal hemorrhage or renal injury identified. Bladder is unremarkable. A 17 mm simple cyst is present posterior left kidney. Recommend no imaging follow-up. Stomach/Bowel: Stomach and duodenum are within normal limits. Small bowel is unremarkable. Terminal ileum is within normal limits. The appendix is visualized and normal. Diverticular present in the ascending colon near the hepatic flexure. Additional diverticular present through the transverse colon and particularly in the distal descending and sigmoid colon without inflammation to suggest diverticulitis. Vascular/Lymphatic: Atherosclerotic calcifications are present in the aorta and branch vessels. Maximal diameter is 2.4 cm. No aneurysm is present. No focal stenosis is evident. No significant adenopathy is present. Reproductive:  Prostate is unremarkable.  Bilateral hydrocele noted. Other: Fat herniates into the inguinal canals bilaterally. No other significant ventral hernia is present. No bowel herniates. Musculoskeletal: Vertebral body heights and alignment are normal. No acute fracture or traumatic subluxation is present. Degenerative changes are present in the lumbar spine with left greater than right stenosis at L4-5 and L5-S1. The bony pelvis is within normal limits. The hips are located and normal. IMPRESSION: 1. No acute trauma to the abdomen or pelvis. 2. Bilateral pulmonary emboli with right heart strain. 3. Cholelithiasis without evidence for cholecystitis. 4. Extensive colonic diverticulosis without diverticulitis. 5. Fat herniates into the inguinal canals bilaterally. No bowel herniates. 6. Degenerative changes in the lumbar spine with left greater than right stenosis at L4-5 and L5-S1. 7. Aortic Atherosclerosis (ICD10-I70.0). Critical Value/emergent results were called by telephone at the time of interpretation on 11/29/2021 at 7:58 am to provider Dr. Renaye Rakers, who verbally acknowledged these results. Electronically Signed   By: Marin Roberts M.D.   On: 11/29/2021 07:58   DG Chest Portable 1 View  Result Date: 11/29/2021 CLINICAL DATA:  Loss of consciousness, lightheadedness. EXAM: PORTABLE CHEST 1 VIEW COMPARISON:  Portable chest 03/07/2020. FINDINGS: There is mild cardiomegaly. No vascular congestion is seen. There is aortic calcification with stable mediastinal configuration. The lungs are clear of infiltrates. There is linear scarring right mid field along the short fissure. No pleural effusion is seen. There is thoracic spondylosis. The thoracic cage intact. There is overlying monitor wiring. IMPRESSION: No acute chest findings. Mild cardiomegaly. Aortic atherosclerosis. Compare: Interval resolution of prior bilateral peripheral infiltrates. Electronically Signed   By: Almira Bar M.D.   On: 11/29/2021 06:25     Microbiology: Results for orders placed or performed  during the hospital encounter of 11/29/21  Resp Panel by RT-PCR (Flu A&B, Covid) Anterior Nasal Swab     Status: None   Collection Time: 11/29/21  7:35 AM   Specimen: Anterior Nasal Swab  Result Value Ref Range Status   SARS Coronavirus 2 by RT PCR NEGATIVE NEGATIVE Final    Comment: (NOTE) SARS-CoV-2 target nucleic acids are NOT DETECTED.  The SARS-CoV-2 RNA is generally detectable in upper respiratory specimens during the acute phase of infection. The lowest concentration of SARS-CoV-2 viral copies this assay can detect is 138 copies/mL. A negative result does not preclude SARS-Cov-2 infection and should not be used as the sole basis for treatment or other patient management decisions. A negative result may occur with  improper specimen collection/handling, submission of specimen other than nasopharyngeal swab, presence of viral mutation(s) within the areas targeted by this assay, and inadequate number of viral copies(<138 copies/mL). A negative result must be combined with clinical observations, patient history, and epidemiological information. The expected result is Negative.  Fact Sheet for Patients:  EntrepreneurPulse.com.au  Fact Sheet for Healthcare Providers:  IncredibleEmployment.be  This test is no t yet approved or cleared by the [place] FDA and  has been authorized for detection and/or diagnosis of SARS-CoV-2 by FDA under an Emergency Use Authorization (EUA). This EUA will remain  in effect (meaning this test can be used) for the duration of the COVID-19 declaration under Section 564(b)(1) of the Act, 21 U.S.C.section 360bbb-3(b)(1), unless the authorization is terminated  or revoked sooner.       Influenza A by PCR NEGATIVE NEGATIVE Final   Influenza B by PCR NEGATIVE NEGATIVE Final    Comment: (NOTE) The Xpert Xpress SARS-CoV-2/FLU/RSV plus assay is intended as an  aid in the diagnosis of influenza from Nasopharyngeal swab specimens and should not be used as a sole basis for treatment. Nasal washings and aspirates are unacceptable for Xpert Xpress SARS-CoV-2/FLU/RSV testing.  Fact Sheet for Patients: EntrepreneurPulse.com.au  Fact Sheet for Healthcare Providers: IncredibleEmployment.be  This test is not yet approved or cleared by the [place] FDA and has been authorized for detection and/or diagnosis of SARS-CoV-2 by FDA under an Emergency Use Authorization (EUA). This EUA will remain in effect (meaning this test can be used) for the duration of the COVID-19 declaration under Section 564(b)(1) of the Act, 21 U.S.C. section 360bbb-3(b)(1), unless the authorization is terminated or revoked.  Performed at [facility], [place] [address], [place], [place] [address]   MRSA Next Gen by PCR, Nasal     Status: None   Collection Time: 11/29/21  1:41 PM   Specimen: Nasal Mucosa; Nasal Swab  Result Value Ref Range Status   MRSA by PCR Next Gen NOT DETECTED NOT DETECTED Final    Comment: (NOTE) The GeneXpert MRSA Assay (FDA approved for NASAL specimens only), is one component of a comprehensive MRSA colonization surveillance program. It is not intended to diagnose MRSA infection nor to guide or monitor treatment for MRSA infections. Test performance is not FDA approved in patients less than 66 years old. Performed at [facility], [place] [address] [place], [place] [address]    Labs: CBC: Recent Labs  Lab 11/29/21 [phone] 11/29/21 0640 11/30/21 0413 12/01/21 0809 12/02/21 0059 12/03/21 0932  WBC 19.1*  --  12.0* 11.5* 10.0 8.2  NEUTROABS 13.4*  --   --   --  5.3 4.6  HGB 14.1 13.9 11.4* 11.3* 10.5* 11.8*  HCT 41.5 41.0 33.6*  33.6* 31.3* 35.6*  MCV 88.1  --  89.4 90.8 89.9 90.4  PLT 158  --  137* 163 150 [id number]   Basic Metabolic Panel: Recent Labs  Lab 11/29/21 0625 11/29/21 0640  11/29/21 0929 11/30/21 0413 12/01/21 0809 12/02/21 0059 12/03/21 0932  NA 137   < > 140 137 140 140 140  K 2.8*   < > 3.6 3.4* 4.5 4.3 4.7  CL 103   < > 102 105 112* 110 112*  CO2 24  --  28 24 21* 24 23  GLUCOSE 212*   < > 178* 114* 124* 118* 128*  BUN 18   < > 15 20 20 19 18   CREATININE 1.35*   < > 1.28* 1.28* 1.22 1.24 1.07  CALCIUM 8.2*  --  8.0* 7.8* 8.2* 8.3* 8.4*  MG 1.7  --   --  2.1 2.0 2.0 2.1  PHOS  --   --   --   --  2.3* 3.5 3.2   < > = values in this interval not displayed.   Liver Function Tests: Recent Labs  Lab 11/29/21 0625 11/30/21 0413 12/01/21 0809 12/02/21 0059 12/03/21 0932  AST 21 25 24 15 18   ALT 15 16 15 13 15   ALKPHOS 84 60 58 57 60  BILITOT 1.0 0.7 0.4 0.6 0.7  PROT 6.1* 5.0* 5.4* 5.0* 5.7*  ALBUMIN 3.3* 2.6* 2.9* 2.5* 2.8*   CBG: Recent Labs  Lab 11/29/21 0606 11/29/21 1554  GLUCAP 198* 161*    Discharge time spent: greater than 30 minutes.  Signed: Raiford Noble, DO [facility] 12/03/2021

## 2021-12-03 NOTE — Progress Notes (Signed)
Mobility Specialist Progress Note:   12/03/21 1535  Mobility  Activity Ambulated independently in hallway  Level of Assistance Standby assist, set-up cues, supervision of patient - no hands on  Assistive Device None  Distance Ambulated (ft) 550 ft  Activity Response Tolerated well  $Mobility charge 1 Mobility   During Ambulation: 91% SpO2  Pt received in chair and eager. No complaints. Pt left in chair with all needs met and call bell in reach.   Kesler Wickham Mobility Specialist-Acute Rehab Secure Chat only

## 2021-12-03 NOTE — Progress Notes (Signed)
OT Cancellation Note  Patient Details Name: Paul Mitchell MRN: [id number] DOB: 08-30-1950   Cancelled Treatment:    Reason Eval/Treat Not Completed: OT screened, no needs identified, will sign off (Per PT, pt does not have acute OT needs. Will sign off, thank you.)  [name] A [name] 12/03/2021, 11:40 AM

## 2021-12-03 NOTE — Progress Notes (Signed)
SATURATION QUALIFICATIONS: (This note is used to comply with regulatory documentation for home oxygen)  Patient Saturations on Room Air at Rest = 97%  Patient Saturations on Room Air while Ambulating = 91%  Patient Saturations on - Liters of oxygen while Ambulating = N/A  Please briefly explain why patient needs home oxygen: Pt did not require oxygen during ambulation.  [name], PT, DPT Acute Rehabilitation Services Office [phone]

## 2021-12-03 NOTE — Evaluation (Signed)
Physical Therapy Evaluation and Discharge Patient Details Name: Paul Mitchell MRN: [id number] DOB: 11/08/1950 Today's Date: 12/03/2021  History of Present Illness  Pt is a 71 y.o. M who presents 11/29/2021 after a fall. CT scan revealed bilateral large saddle pulmonary embolus with no acute injury to head or neck or abdomen. Significant PMH: HTN, COVID, PE.  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 Pt overall is mobilizing very well. Ambulating 540 ft with no assistive device and negotiated 5 steps with a railing to simulate home set up. SpO2 91-97% on RA, HR 99-109, BP post mobility 136/85 (101). Discussed generalized walking program and activity progression. Pt with no further acute PT needs. Thank you for this consult.     Recommendations for follow up therapy are one component of a multi-disciplinary discharge planning process, led by the attending physician.  Recommendations may be updated based on patient status, additional functional criteria and insurance authorization.  Follow Up Recommendations No PT follow up      Assistance Recommended at Discharge Intermittent Supervision/Assistance  Patient can return home with the following  Assist for transportation    Equipment Recommendations None recommended by PT  Recommendations for Other Services       Functional Status Assessment Patient has had a recent decline in their functional status and demonstrates the ability to make significant improvements in function in a reasonable and predictable amount of time.     Precautions / Restrictions Precautions Precautions: None Restrictions Weight Bearing Restrictions: No      Mobility  Bed Mobility               General bed mobility comments: OOB in chair    Transfers Overall transfer level: Independent Equipment used: None                    Ambulation/Gait Ambulation/Gait assistance:  Independent Gait Distance (Feet): 540 Feet Assistive device: None Gait Pattern/deviations: WFL(Within Functional Limits)          Stairs Stairs: Yes Stairs assistance: Modified independent (Device/Increase time) Stair Management: One rail Right Number of Stairs: 5 General stair comments: step over step pattern  Wheelchair Mobility    Modified Rankin (Stroke Patients Only)       Balance Overall balance assessment: No apparent balance deficits (not formally assessed)                                           Pertinent Vitals/Pain Pain Assessment Pain Assessment: No/denies pain    Home Living Family/patient expects to be discharged to:: Private residence Living Arrangements: Spouse/significant other Available Help at Discharge: Family Type of Home: House Home Access: Stairs to enter Entrance Stairs-Rails: Left Entrance Stairs-Number of Steps: 4   Home Layout: One level        Prior Function Prior Level of Function : Independent/Modified Independent             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       Hand Dominance        Extremity/Trunk Assessment   Upper Extremity Assessment Upper Extremity Assessment: Overall WFL for tasks assessed    Lower Extremity Assessment Lower Extremity Assessment: RLE deficits/detail;LLE deficits/detail RLE Deficits / Details: Strength 5/5 LLE Deficits / Details: Strength 5/5    Cervical / Trunk Assessment  Cervical / Trunk Assessment: Normal  Communication   Communication: No difficulties  Cognition Arousal/Alertness: Awake/alert Behavior During Therapy: WFL for tasks assessed/performed Overall Cognitive Status: Within Functional Limits for tasks assessed                                          General Comments      Exercises     Assessment/Plan    PT Assessment Patient does not need any further PT services  PT Problem List         PT  Treatment Interventions      PT Goals (Current goals can be found in the Care Plan section)  Acute Rehab PT Goals Patient Stated Goal: build endurance back up PT Goal Formulation: All assessment and education complete, DC therapy    Frequency       Co-evaluation               AM-PAC PT "6 Clicks" Mobility  Outcome Measure Help needed turning from your back to your side while in a flat bed without using bedrails?: None Help needed moving from lying on your back to sitting on the side of a flat bed without using bedrails?: None Help needed moving to and from a bed to a chair (including a wheelchair)?: None Help needed standing up from a chair using your arms (e.g., wheelchair or bedside chair)?: None Help needed to walk in hospital room?: None Help needed climbing 3-5 steps with a railing? : None 6 Click Score: 24    End of Session   Activity Tolerance: Patient tolerated treatment well Patient left: in chair;with call bell/phone within reach Nurse Communication: Mobility status PT Visit Diagnosis: Difficulty in walking, not elsewhere classified (R26.2)    Time: 5945-8592 PT Time Calculation (min) (ACUTE ONLY): 20 min   Charges:   PT Evaluation $PT Eval Low Complexity: 1 Low          Paul Mitchell, PT, DPT Acute Rehabilitation Services Office [phone]   Paul Mitchell 12/03/2021, 11:40 AM

## 2021-12-03 NOTE — Discharge Instructions (Addendum)
Information on my medicine - ELIQUIS (apixaban)   Why was Eliquis prescribed for you? Eliquis was prescribed to treat blood clots that may have been found in the veins of your legs (deep vein thrombosis) or in your lungs (pulmonary embolism) and to reduce the risk of them occurring again.  What do You need to know about Eliquis ? The starting dose is 10 mg (two 5 mg tablets) taken TWICE daily for the FIRST SEVEN (7) DAYS, then on (enter date)  12/09/21  the dose is reduced to ONE 5 mg tablet taken TWICE daily.  Eliquis may be taken with or without food.   Try to take the dose about the same time in the morning and in the evening. If you have difficulty swallowing the tablet whole please discuss with your pharmacist how to take the medication safely.  Take Eliquis exactly as prescribed and DO NOT stop taking Eliquis without talking to the doctor who prescribed the medication.  Stopping may increase your risk of developing a new blood clot.  Refill your prescription before you run out.  After discharge, you should have regular check-up appointments with your healthcare provider that is prescribing your Eliquis.    What do you do if you miss a dose? If a dose of ELIQUIS is not taken at the scheduled time, take it as soon as possible on the same day and twice-daily administration should be resumed. The dose should not be doubled to make up for a missed dose.  Important Safety Information A possible side effect of Eliquis is bleeding. You should call your healthcare provider right away if you experience any of the following: Bleeding from an injury or your nose that does not stop. Unusual colored urine (red or dark brown) or unusual colored stools (red or black). Unusual bruising for unknown reasons. A serious fall or if you hit your head (even if there is no bleeding).  Some medicines may interact with Eliquis and might increase your risk of bleeding or clotting while on Eliquis. To  help avoid this, consult your healthcare provider or pharmacist prior to using any new prescription or non-prescription medications, including herbals, vitamins, non-steroidal anti-inflammatory drugs (NSAIDs) and supplements.  This website has more information on Eliquis (apixaban): http://www.eliquis.com/eliquis/home

## 2021-12-03 NOTE — TOC Transition Note (Signed)
Transition of Care [facility]) - CM/SW Discharge Note   Patient Details  Name: Paul Mitchell MRN: [id number] Date of Birth: [birth year]  Transition of Care [facility] CM/SW Contact:  Bess Kinds, RN Phone Number: [phone] 12/03/2021, 5:04 PM   Clinical Narrative:     Patient to transition home today. Spoke with wife and daughter at the bedside. Patient has been on eliquis in the past. Unsure if he actually used 30 day card previously. Card provided with understanding that can only be used once. No further TOC needs identified.   Final next level of care: Home/Self Care     Patient Goals and CMS Choice        Discharge Placement                       Discharge Plan and Services                                     Social Determinants of Health (SDOH) Interventions     Readmission Risk Interventions     No data to display

## 2021-12-15 ENCOUNTER — Encounter: Payer: Self-pay | Admitting: Internal Medicine

## 2021-12-15 ENCOUNTER — Inpatient Hospital Stay: Payer: Managed Care, Other (non HMO)

## 2021-12-15 ENCOUNTER — Inpatient Hospital Stay: Payer: Managed Care, Other (non HMO) | Attending: Internal Medicine | Admitting: Internal Medicine

## 2021-12-15 VITALS — BP 123/75 | HR 85 | Temp 98.1°F | Resp 18 | Wt 231.0 lb

## 2021-12-15 DIAGNOSIS — I129 Hypertensive chronic kidney disease with stage 1 through stage 4 chronic kidney disease, or unspecified chronic kidney disease: Secondary | ICD-10-CM | POA: Insufficient documentation

## 2021-12-15 DIAGNOSIS — Z86711 Personal history of pulmonary embolism: Secondary | ICD-10-CM | POA: Insufficient documentation

## 2021-12-15 DIAGNOSIS — I700 Atherosclerosis of aorta: Secondary | ICD-10-CM | POA: Diagnosis not present

## 2021-12-15 DIAGNOSIS — Z79899 Other long term (current) drug therapy: Secondary | ICD-10-CM | POA: Insufficient documentation

## 2021-12-15 DIAGNOSIS — Z7901 Long term (current) use of anticoagulants: Secondary | ICD-10-CM | POA: Insufficient documentation

## 2021-12-15 DIAGNOSIS — I824Y2 Acute embolism and thrombosis of unspecified deep veins of left proximal lower extremity: Secondary | ICD-10-CM | POA: Diagnosis not present

## 2021-12-15 DIAGNOSIS — I2692 Saddle embolus of pulmonary artery without acute cor pulmonale: Secondary | ICD-10-CM

## 2021-12-15 DIAGNOSIS — Z86718 Personal history of other venous thrombosis and embolism: Secondary | ICD-10-CM | POA: Insufficient documentation

## 2021-12-15 DIAGNOSIS — M47814 Spondylosis without myelopathy or radiculopathy, thoracic region: Secondary | ICD-10-CM | POA: Diagnosis not present

## 2021-12-15 LAB — ANTITHROMBIN III: AntiThromb III Func: 102 % (ref 75–120)

## 2021-12-15 NOTE — Progress Notes (Signed)
Patient here today for initial evaluation regarding pulmonary embolism.

## 2021-12-15 NOTE — Progress Notes (Addendum)
[place]  [phone] [phone]) [phone]  ID: Paul Mitchell OB: [birth year]  MR#: [id number]  [id number]  Patient Care Team: Sofie Hartigan, MD as PCP - General (Family Medicine)  REFERRING PROVIDER: Dr. [name]  REASON FOR REFERRAL: recurrent unprovoked PE/DVT  HPI: Paul Mitchell is a 71 y.o. male with past medical history of hypertension and recurrent unprovoked PE and DVT was referred to hematology for thrombophilia work-up.  Patient was admitted from 11/29/2021 to 12/03/2021 after a syncopal episode and shortness of breath.  CTA chest showed bilateral acute arterial thrombus with large clot burden and right heart strain.  He required 15 L nonrebreather oxygen.  He had urgent mechanical thrombectomy and was on IV contrast.  Postprocedure, he had improvement in his oxygen levels.  He was transitioned to Eliquis on discharge.  Denies any long distance travel, recent surgery or trauma.  He is tolerating Eliquis well.  He reports history of first episode of unprovoked DVT in 2008 when he was treated with anticoagulation for 6 to 12 months.  He had a second episode of DVT in 2021 when he was admitted for [facility].  He was treated with 6 months of anticoagulation and then was on prophylactic dose of 2.5 mg twice daily until March 2023.  Denies any family history of clot.  REVIEW OF SYSTEMS:   ROS  As per HPI. Otherwise, a complete review of systems is negative.  PAST MEDICAL HISTORY: Past Medical History:  Diagnosis Date   Hypertension     PAST SURGICAL HISTORY: Past Surgical History:  Procedure Laterality Date   IR ANGIOGRAM PULMONARY BILATERAL SELECTIVE  11/29/2021   IR ANGIOGRAM SELECTIVE EACH ADDITIONAL VESSEL  11/29/2021   IR ANGIOGRAM SELECTIVE EACH ADDITIONAL VESSEL  11/29/2021   IR THROMBECT PRIM MECH INIT (INCLU) MOD SED  11/29/2021   IR THROMBECT PRIM MECH INIT (INCLU) MOD SED  11/29/2021   IR US GUIDE VASC ACCESS RIGHT  11/29/2021     FAMILY HISTORY: History reviewed. No pertinent family history.  HEALTH MAINTENANCE: Social History   Tobacco Use   Smoking status: Never   Smokeless tobacco: Never  Substance Use Topics   Alcohol use: Not Currently   Drug use: Not Currently     Allergies  Allergen Reactions   Enalapril Cough    Current Outpatient Medications  Medication Sig Dispense Refill   acetaminophen (TYLENOL) 325 MG tablet Take 2 tablets (650 mg total) by mouth every 6 (six) hours as needed for headache or moderate pain. 20 tablet 0   albuterol (VENTOLIN HFA) 108 (90 Base) MCG/ACT inhaler Inhale 2 puffs into the lungs every 6 (six) hours as needed for wheezing or shortness of breath. 8 g 0   apixaban (ELIQUIS) 5 MG TABS tablet Take 2 tablets (10 mg total) by mouth 2 (two) times daily for 6 days, THEN 1 tablet (5 mg total) 2 (two) times daily. 84 tablet 0   docusate sodium (COLACE) 100 MG capsule Take 1 capsule (100 mg total) by mouth 2 (two) times daily as needed for mild constipation. 10 capsule 0   furosemide (LASIX) 20 MG tablet Take 1 tablet (20 mg total) by mouth daily as needed for fluid or edema. 30 tablet 0   pantoprazole (PROTONIX) 40 MG tablet Take 1 tablet (40 mg total) by mouth daily. 30 tablet 0   polyethylene glycol (MIRALAX / GLYCOLAX) 17 g packet Take 17 g by mouth daily as needed for moderate constipation. 14 each 0  No current facility-administered medications for this visit.    OBJECTIVE: Vitals:   12/15/21 1052  BP: 123/75  Pulse: 85  Resp: 18  Temp: 98.1 F (36.7 C)  SpO2: 96%     Body mass index is 29.66 kg/m.      General: Well-developed, well-nourished, no acute distress. Eyes: Pink conjunctiva, anicteric sclera. HEENT: Normocephalic, moist mucous membranes, clear oropharnyx. Lungs: Clear to auscultation bilaterally. Heart: Regular rate and rhythm. No rubs, murmurs, or gallops. Abdomen: Soft, nontender, nondistended. No organomegaly noted, normoactive bowel  sounds. Musculoskeletal: No edema, cyanosis, or clubbing. Neuro: Alert, answering all questions appropriately. Cranial nerves grossly intact. Skin: No rashes or petechiae noted. Psych: Normal affect. Lymphatics: No cervical, calvicular, axillary or inguinal LAD.   LAB RESULTS:  Lab Results  Component Value Date   NA 140 12/03/2021   K 4.7 12/03/2021   CL 112 (H) 12/03/2021   CO2 23 12/03/2021   GLUCOSE 128 (H) 12/03/2021   BUN 18 12/03/2021   CREATININE 1.07 12/03/2021   CALCIUM 8.4 (L) 12/03/2021   PROT 5.7 (L) 12/03/2021   ALBUMIN 2.8 (L) 12/03/2021   AST 18 12/03/2021   ALT 15 12/03/2021   ALKPHOS 60 12/03/2021   BILITOT 0.7 12/03/2021   GFRNONAA >60 12/03/2021    Lab Results  Component Value Date   WBC 8.2 12/03/2021   NEUTROABS 4.6 12/03/2021   HGB 11.8 (L) 12/03/2021   HCT 35.6 (L) 12/03/2021   MCV 90.4 12/03/2021   PLT 185 12/03/2021    Lab Results  Component Value Date   FERRITIN 497 (H) 03/08/2020   FERRITIN 528 (H) 03/07/2020   FERRITIN 580 (H) 03/06/2020     STUDIES: ECHOCARDIOGRAM COMPLETE  Result Date: 11/30/2021    ECHOCARDIOGRAM REPORT   Patient Name:   Paul Mitchell Date of Exam: 11/30/2021 Medical Rec #:  [id number]       Height:       74.0 in Accession #:    [id number]      Weight:       228.8 lb Date of Birth:  13-Mar-1951      BSA:          2.302 m Patient Age:    69 years        BP:           93/65 mmHg Patient Gender: M               HR:           87 bpm. Exam Location:  Inpatient Procedure: 2D Echo, Color Doppler and Cardiac Doppler Indications:    Pulmonary Embolus  History:        Patient has prior history of Echocardiogram examinations, most                 recent 03/04/2020. Risk Factors:Hypertension.  Sonographer:    Memory Argue Referring Phys: [place]  1. Mcconnell's sign noted.  2. Left ventricular ejection fraction, by estimation, is 65 to 70%. The left ventricle has normal function. The left ventricle has no  regional wall motion abnormalities. There is mild left ventricular hypertrophy. Left ventricular diastolic parameters are consistent with Grade I diastolic dysfunction (impaired relaxation).  3. Right ventricular systolic function is moderately reduced. The right ventricular size is normal. There is moderately elevated pulmonary artery systolic pressure.  4. The mitral valve is normal in structure. No evidence of mitral valve regurgitation. No evidence of mitral stenosis.  5. The  aortic valve is tricuspid. Aortic valve regurgitation is not visualized. Aortic valve sclerosis/calcification is present, without any evidence of aortic stenosis.  6. Aortic dilatation noted. There is mild dilatation of the aortic root, measuring 44 mm.  7. The inferior vena cava is dilated in size with >50% respiratory variability, suggesting right atrial pressure of 8 mmHg. FINDINGS  Left Ventricle: Left ventricular ejection fraction, by estimation, is 65 to 70%. The left ventricle has normal function. The left ventricle has no regional wall motion abnormalities. The left ventricular internal cavity size was normal in size. There is  mild left ventricular hypertrophy. Left ventricular diastolic parameters are consistent with Grade I diastolic dysfunction (impaired relaxation). Right Ventricle: The right ventricular size is normal. Right ventricular systolic function is moderately reduced. There is moderately elevated pulmonary artery systolic pressure. The tricuspid regurgitant velocity is 3.15 m/s, and with an assumed right atrial pressure of 8 mmHg, the estimated right ventricular systolic pressure is [id number] mmHg. Left Atrium: Left atrial size was normal in size. Right Atrium: Right atrial size was normal in size. Pericardium: Trivial pericardial effusion is present. Mitral Valve: The mitral valve is normal in structure. No evidence of mitral valve regurgitation. No evidence of mitral valve stenosis. Tricuspid Valve: The tricuspid valve  is normal in structure. Tricuspid valve regurgitation is mild . No evidence of tricuspid stenosis. Aortic Valve: The aortic valve is tricuspid. Aortic valve regurgitation is not visualized. Aortic valve sclerosis/calcification is present, without any evidence of aortic stenosis. Aortic valve mean gradient measures 4.0 mmHg. Aortic valve peak gradient measures 7.0 mmHg. Aortic valve area, by VTI measures 4.57 cm. Pulmonic Valve: The pulmonic valve was normal in structure. Pulmonic valve regurgitation is not visualized. No evidence of pulmonic stenosis. Aorta: Aortic dilatation noted. There is mild dilatation of the aortic root, measuring 44 mm. Venous: The inferior vena cava is dilated in size with greater than 50% respiratory variability, suggesting right atrial pressure of 8 mmHg. IAS/Shunts: No atrial level shunt detected by color flow Doppler. Additional Comments: Mcconnell's sign noted.  LEFT VENTRICLE PLAX 2D LVIDd:         4.80 cm   Diastology LVIDs:         2.70 cm   LV e' medial:    9.36 cm/s LV PW:         1.10 cm   LV E/e' medial:  6.5 LV IVS:        1.20 cm   LV e' lateral:   7.40 cm/s LVOT diam:     2.60 cm   LV E/e' lateral: 8.3 LV SV:         105 LV SV Index:   45 LVOT Area:     5.31 cm  RIGHT VENTRICLE TAPSE (M-mode): 2.0 cm LEFT ATRIUM             Index        RIGHT ATRIUM           Index LA diam:        3.10 cm 1.35 cm/m   RA Area:     18.43 cm LA Vol (A2C):   69.9 ml 30.37 ml/m  RA Volume:   45.97 ml  19.97 ml/m LA Vol (A4C):   44.9 ml 19.51 ml/m LA Biplane Vol: 56.7 ml 24.64 ml/m  AORTIC VALVE AV Area (Vmax):    5.07 cm AV Area (Vmean):   4.65 cm AV Area (VTI):     4.57 cm AV Vmax:  132.00 cm/s AV Vmean:          90.900 cm/s AV VTI:            0.229 m AV Peak Grad:      7.0 mmHg AV Mean Grad:      4.0 mmHg LVOT Vmax:         126.00 cm/s LVOT Vmean:        79.600 cm/s LVOT VTI:          0.197 m LVOT/AV VTI ratio: 0.86  AORTA Ao Root diam: 4.40 cm Ao Asc diam:  3.50 cm MITRAL  VALVE               TRICUSPID VALVE MV Area (PHT): 4.21 cm    TR Peak grad:   39.7 mmHg MV Decel Time: 180 msec    TR Vmax:        315.00 cm/s MV E velocity: 61.10 cm/s MV A velocity: 71.30 cm/s  SHUNTS MV E/A ratio:  0.86        Systemic VTI:  0.20 m                            Systemic Diam: 2.60 cm Kirk Ruths MD Electronically signed by Kirk Ruths MD Signature Date/Time: 11/30/2021/12:14:22 PM    Final    IR US Guide Vasc Access Right  Result Date: 11/29/2021 INDICATION: 71 year old male with submassive high-risk pulmonary embolism, presents for treatment EXAM: ULTRASOUND-GUIDED ACCESS RIGHT COMMON FEMORAL VEIN PULMONARY ANGIOGRAM MECHANICAL/ASPIRATION THROMBECTOMY OF BILATERAL PULMONARY ARTERIES PULMONARY ARTERY PRESSURE MEASUREMENTS COMPARISON:  None MEDICATIONS: Eight thousand IV units heparin 500 cc normal saline bolus ANESTHESIA/SEDATION: Moderate (conscious) sedation was employed during this procedure. A total of Versed 1.0 mg and Fentanyl 25 mcg was administered intravenously by the radiology nurse. Total intra-service moderate Sedation Time: 106 minutes. The patient's level of consciousness and vital signs were monitored continuously by radiology nursing throughout the procedure under my direct supervision. FLUOROSCOPY: Radiation Exposure Index (as provided by the fluoroscopic device): 21 minutes, 18 seconds, [id number] mGy Kerma COMPLICATIONS: None TECHNIQUE: Informed consent was obtained from the patient and the patient's family following explanation of the procedure, risks, benefits and alternatives. Specific risks include bleeding, infection, contrast reaction, kidney injury, venous injury, life-threatening hemorrhage, need for further surgery, need for further procedure, cardiopulmonary collapse, death. The patient understands, agrees and consents for the procedure. All questions were addressed. Patient is positioned supine position on the fluoroscopy table. Maximal barrier sterile technique  utilized including caps, mask, sterile gowns, sterile gloves, large sterile drape, hand hygiene, and chlorhexidine prep. 1% lidocaine used for local anesthesia. Moderate sedation was provided. Ultrasound survey of the right inguinal region was performed with images stored and sent to PACs, confirming patency of the right common femoral vein. A single wall needle was used access the right common femoral vein under ultrasound. With excellent blood flow returned, 035 wire was passed through the needle, observed to enter the IVC under fluoroscopy. The needle was removed, and a standard 6 [place] vascular sheath was placed. The dilator was removed and the sheath was flushed. Combination of a angled pigtail catheter and a Bentson wire was then used to navigate to the right heart. Angled catheter used to select the main pulmonary artery. Wire was removed and angiogram was performed. Pressure measurement was obtained. Stiff Glidewire was then advanced through the pigtail catheter, reducing the curve and flipping into the left-sided pulmonary artery. The wire  was straightened into the downgoing branches of the right pulmonary artery. Pigtail catheter was then removed. The 6 French sheath was removed. 71 French dilation was performed and then an 18 [place] Gore dry seal sheath was placed. Over the wire, a coaxial 125 cm H1 diagnostic catheter with 16 French H torque aspiration catheter were advanced into the downgoing branches of the left pulmonary arteries. The diagnostic catheter was removed, and aspiration was initiated with the 16 [place] device. After the initial aspiration attempt, there was some hemodynamic instability, with oxygen saturation dropping into the low 123XX123, systolic blood pressure dropping into the mid 60s, and the aspiration catheter became occluded. Stepwise removal of the aspiration catheter over the Glidewire was performed, ultimately with removal completely from the system as well as removal of the  [place]ore dry seal sheath from the venous access to clear the catheter of retained thrombus. Once the catheter and the sheath were cleared of residual thrombus/blood products the dry seal sheath was advanced on the Glidewire to the level of the right atrium. Introducer was removed on the wire. The angled pigtail catheter was then used to select the main pulmonary artery. Stiff Glidewire was a again advanced through the pigtail catheter, selecting the left-sided branches. Pigtail catheter was removed. Placement of the coaxial H torque 16 French device and the 125 cm catheter was again into the downgoing branches on the left. Once the aspiration catheter was in position the coaxial catheter was removed from the wire. Aspiration was then performed of the left-sided pulmonary artery and the segmental arteries into the downgoing branches. Catheter was removed from the system for clearance. Once the sheath was clear to of all residual thrombus the pigtail catheter was advanced again through the right heart into the main pulmonary artery. A repeat pulmonary artery pressure was performed at this time. Repeat angiogram was performed at this time. Stiff Glidewire was then again advanced through the pigtail catheter into the right-sided branches. Pigtail catheter was removed. The coaxial diagnostic catheter and aspiration catheter were advanced into the right-sided branches. Catheter was removed on the wire. Aspiration thrombectomy was performed within the right-sided segmental branches. When the catheter became clogged with aspirated material, the aspiration catheter was removed from the sheath and cleared of all broad products. At this time the sheath was confirmed to be clogged with some retained blood products/thrombus. The sheath was removed on the stiff Glidewire. Both the aspiration catheter and the sheath were cleared of all blood products and thrombus material. The introducer was then placed through the Gore dry seal  sheath and the sheath was reinserted on the Glidewire into the femoral vein. The sheath was position at the base of the right atrium. Introducer was removed. Pigtail catheter was then again advanced through the sheath into the main pulmonary artery. Final pressure measurement was performed. At this time the pressure measurement had nearly normalized, to a mean pressure of 26. Additionally the patient's initial 15 L oxygen requirement was now decreased to 3 L for 100% oxygen saturation. We elected to terminate the case at this point. All catheters and wires were removed. The sheath was withdrawn and a stay suture was placed at the femoral vein puncture site. Manual pressure was performed for 20 minutes and then a compression dressing was added. The patient tolerated the procedure well. No complications were encountered. Estimated blood loss 300 cc. FINDINGS: Initial PA pressure: 54/35 (42) After left PA treatment: 42/30 (35) Final PA pressure after left & right treatment: 34/21 (  26) IMPRESSION: Status post ultrasound guided access right common femoral vein for pulmonary angiogram/pressure measurements and mechanical/aspiration thrombectomy of bilateral pulmonary emboli for high risk submassive PE presentation. Signed, Dulcy Fanny. Nadene Rubins, RPVI [facility] Radiology Electronically Signed   By: Corrie Mckusick D.O.   On: 11/29/2021 15:19   IR THROMBECT PRIM MECH INIT (INCLU) MOD SED  Result Date: 11/29/2021 INDICATION: 71 year old male with submassive high-risk pulmonary embolism, presents for treatment EXAM: ULTRASOUND-GUIDED ACCESS RIGHT COMMON FEMORAL VEIN PULMONARY ANGIOGRAM MECHANICAL/ASPIRATION THROMBECTOMY OF BILATERAL PULMONARY ARTERIES PULMONARY ARTERY PRESSURE MEASUREMENTS COMPARISON:  None MEDICATIONS: Eight thousand IV units heparin 500 cc normal saline bolus ANESTHESIA/SEDATION: Moderate (conscious) sedation was employed during this procedure. A total  of Versed 1.0 mg and Fentanyl 25 mcg was administered intravenously by the radiology nurse. Total intra-service moderate Sedation Time: 106 minutes. The patient's level of consciousness and vital signs were monitored continuously by radiology nursing throughout the procedure under my direct supervision. FLUOROSCOPY: Radiation Exposure Index (as provided by the fluoroscopic device): 21 minutes, 18 seconds, [id number] mGy Kerma COMPLICATIONS: None TECHNIQUE: Informed consent was obtained from the patient and the patient's family following explanation of the procedure, risks, benefits and alternatives. Specific risks include bleeding, infection, contrast reaction, kidney injury, venous injury, life-threatening hemorrhage, need for further surgery, need for further procedure, cardiopulmonary collapse, death. The patient understands, agrees and consents for the procedure. All questions were addressed. Patient is positioned supine position on the fluoroscopy table. Maximal barrier sterile technique utilized including caps, mask, sterile gowns, sterile gloves, large sterile drape, hand hygiene, and chlorhexidine prep. 1% lidocaine used for local anesthesia. Moderate sedation was provided. Ultrasound survey of the right inguinal region was performed with images stored and sent to PACs, confirming patency of the right common femoral vein. A single wall needle was used access the right common femoral vein under ultrasound. With excellent blood flow returned, 035 wire was passed through the needle, observed to enter the IVC under fluoroscopy. The needle was removed, and a standard 6 [place] vascular sheath was placed. The dilator was removed and the sheath was flushed. Combination of a angled pigtail catheter and a Bentson wire was then used to navigate to the right heart. Angled catheter used to select the main pulmonary artery. Wire was removed and angiogram was performed. Pressure measurement was obtained. Stiff Glidewire was then  advanced through the pigtail catheter, reducing the curve and flipping into the left-sided pulmonary artery. The wire was straightened into the downgoing branches of the right pulmonary artery. Pigtail catheter was then removed. The 6 French sheath was removed. 60 French dilation was performed and then an 74 [place] Gore dry seal sheath was placed. Over the wire, a coaxial 125 cm H1 diagnostic catheter with 16 French H torque aspiration catheter were advanced into the downgoing branches of the left pulmonary arteries. The diagnostic catheter was removed, and aspiration was initiated with the 16 [place] device. After the initial aspiration attempt, there was some hemodynamic instability, with oxygen saturation dropping into the low [id number], systolic blood pressure dropping into the mid 60s, and the aspiration catheter became occluded. Stepwise removal of the aspiration catheter over the Glidewire was performed, ultimately with removal completely from the system as well as removal of the Gore dry seal sheath from the venous access to clear the catheter of retained thrombus. Once the catheter and the sheath were cleared of residual thrombus/blood products the dry seal sheath was advanced on the Glidewire to the level  of the right atrium. Introducer was removed on the wire. The angled pigtail catheter was then used to select the main pulmonary artery. Stiff Glidewire was a again advanced through the pigtail catheter, selecting the left-sided branches. Pigtail catheter was removed. Placement of the coaxial H torque 16 French device and the 125 cm catheter was again into the downgoing branches on the left. Once the aspiration catheter was in position the coaxial catheter was removed from the wire. Aspiration was then performed of the left-sided pulmonary artery and the segmental arteries into the downgoing branches. Catheter was removed from the system for clearance. Once the sheath was clear to of all residual thrombus  the pigtail catheter was advanced again through the right heart into the main pulmonary artery. A repeat pulmonary artery pressure was performed at this time. Repeat angiogram was performed at this time. Stiff Glidewire was then again advanced through the pigtail catheter into the right-sided branches. Pigtail catheter was removed. The coaxial diagnostic catheter and aspiration catheter were advanced into the right-sided branches. Catheter was removed on the wire. Aspiration thrombectomy was performed within the right-sided segmental branches. When the catheter became clogged with aspirated material, the aspiration catheter was removed from the sheath and cleared of all broad products. At this time the sheath was confirmed to be clogged with some retained blood products/thrombus. The sheath was removed on the stiff Glidewire. Both the aspiration catheter and the sheath were cleared of all blood products and thrombus material. The introducer was then placed through the Gore dry seal sheath and the sheath was reinserted on the Glidewire into the femoral vein. The sheath was position at the base of the right atrium. Introducer was removed. Pigtail catheter was then again advanced through the sheath into the main pulmonary artery. Final pressure measurement was performed. At this time the pressure measurement had nearly normalized, to a mean pressure of 26. Additionally the patient's initial 15 L oxygen requirement was now decreased to 3 L for 100% oxygen saturation. We elected to terminate the case at this point. All catheters and wires were removed. The sheath was withdrawn and a stay suture was placed at the femoral vein puncture site. Manual pressure was performed for 20 minutes and then a compression dressing was added. The patient tolerated the procedure well. No complications were encountered. Estimated blood loss 300 cc. FINDINGS: Initial PA pressure: 54/35 (42) After left PA treatment: 42/30 (35) Final PA  pressure after left & right treatment: 34/21 (26) IMPRESSION: Status post ultrasound guided access right common femoral vein for pulmonary angiogram/pressure measurements and mechanical/aspiration thrombectomy of bilateral pulmonary emboli for high risk submassive PE presentation. Signed, Dulcy Fanny. Nadene Rubins, RPVI [facility] Radiology Electronically Signed   By: Corrie Mckusick D.O.   On: 11/29/2021 15:19   IR THROMBECT PRIM MECH INIT (INCLU) MOD SED  Result Date: 11/29/2021 INDICATION: 71 year old male with submassive high-risk pulmonary embolism, presents for treatment EXAM: ULTRASOUND-GUIDED ACCESS RIGHT COMMON FEMORAL VEIN PULMONARY ANGIOGRAM MECHANICAL/ASPIRATION THROMBECTOMY OF BILATERAL PULMONARY ARTERIES PULMONARY ARTERY PRESSURE MEASUREMENTS COMPARISON:  None MEDICATIONS: Eight thousand IV units heparin 500 cc normal saline bolus ANESTHESIA/SEDATION: Moderate (conscious) sedation was employed during this procedure. A total of Versed 1.0 mg and Fentanyl 25 mcg was administered intravenously by the radiology nurse. Total intra-service moderate Sedation Time: 106 minutes. The patient's level of consciousness and vital signs were monitored continuously by radiology nursing throughout the procedure under my direct supervision. FLUOROSCOPY: Radiation Exposure Index (as provided by  the fluoroscopic device): 21 minutes, 18 seconds, [id number] mGy Kerma COMPLICATIONS: None TECHNIQUE: Informed consent was obtained from the patient and the patient's family following explanation of the procedure, risks, benefits and alternatives. Specific risks include bleeding, infection, contrast reaction, kidney injury, venous injury, life-threatening hemorrhage, need for further surgery, need for further procedure, cardiopulmonary collapse, death. The patient understands, agrees and consents for the procedure. All questions were addressed. Patient is positioned supine position on  the fluoroscopy table. Maximal barrier sterile technique utilized including caps, mask, sterile gowns, sterile gloves, large sterile drape, hand hygiene, and chlorhexidine prep. 1% lidocaine used for local anesthesia. Moderate sedation was provided. Ultrasound survey of the right inguinal region was performed with images stored and sent to PACs, confirming patency of the right common femoral vein. A single wall needle was used access the right common femoral vein under ultrasound. With excellent blood flow returned, 035 wire was passed through the needle, observed to enter the IVC under fluoroscopy. The needle was removed, and a standard 6 [place] vascular sheath was placed. The dilator was removed and the sheath was flushed. Combination of a angled pigtail catheter and a Bentson wire was then used to navigate to the right heart. Angled catheter used to select the main pulmonary artery. Wire was removed and angiogram was performed. Pressure measurement was obtained. Stiff Glidewire was then advanced through the pigtail catheter, reducing the curve and flipping into the left-sided pulmonary artery. The wire was straightened into the downgoing branches of the right pulmonary artery. Pigtail catheter was then removed. The 6 French sheath was removed. 99 French dilation was performed and then an 45 [place] Gore dry seal sheath was placed. Over the wire, a coaxial 125 cm H1 diagnostic catheter with 16 French H torque aspiration catheter were advanced into the downgoing branches of the left pulmonary arteries. The diagnostic catheter was removed, and aspiration was initiated with the 16 [place] device. After the initial aspiration attempt, there was some hemodynamic instability, with oxygen saturation dropping into the low [id number], systolic blood pressure dropping into the mid 60s, and the aspiration catheter became occluded. Stepwise removal of the aspiration catheter over the Glidewire was performed, ultimately with  removal completely from the system as well as removal of the Gore dry seal sheath from the venous access to clear the catheter of retained thrombus. Once the catheter and the sheath were cleared of residual thrombus/blood products the dry seal sheath was advanced on the Glidewire to the level of the right atrium. Introducer was removed on the wire. The angled pigtail catheter was then used to select the main pulmonary artery. Stiff Glidewire was a again advanced through the pigtail catheter, selecting the left-sided branches. Pigtail catheter was removed. Placement of the coaxial H torque 16 French device and the 125 cm catheter was again into the downgoing branches on the left. Once the aspiration catheter was in position the coaxial catheter was removed from the wire. Aspiration was then performed of the left-sided pulmonary artery and the segmental arteries into the downgoing branches. Catheter was removed from the system for clearance. Once the sheath was clear to of all residual thrombus the pigtail catheter was advanced again through the right heart into the main pulmonary artery. A repeat pulmonary artery pressure was performed at this time. Repeat angiogram was performed at this time. Stiff Glidewire was then again advanced through the pigtail catheter into the right-sided branches. Pigtail catheter was removed. The coaxial diagnostic catheter and aspiration catheter were advanced into the  right-sided branches. Catheter was removed on the wire. Aspiration thrombectomy was performed within the right-sided segmental branches. When the catheter became clogged with aspirated material, the aspiration catheter was removed from the sheath and cleared of all broad products. At this time the sheath was confirmed to be clogged with some retained blood products/thrombus. The sheath was removed on the stiff Glidewire. Both the aspiration catheter and the sheath were cleared of all blood products and thrombus material.  The introducer was then placed through the Gore dry seal sheath and the sheath was reinserted on the Glidewire into the femoral vein. The sheath was position at the base of the right atrium. Introducer was removed. Pigtail catheter was then again advanced through the sheath into the main pulmonary artery. Final pressure measurement was performed. At this time the pressure measurement had nearly normalized, to a mean pressure of 26. Additionally the patient's initial 15 L oxygen requirement was now decreased to 3 L for 100% oxygen saturation. We elected to terminate the case at this point. All catheters and wires were removed. The sheath was withdrawn and a stay suture was placed at the femoral vein puncture site. Manual pressure was performed for 20 minutes and then a compression dressing was added. The patient tolerated the procedure well. No complications were encountered. Estimated blood loss 300 cc. FINDINGS: Initial PA pressure: 54/35 (42) After left PA treatment: 42/30 (35) Final PA pressure after left & right treatment: 34/21 (26) IMPRESSION: Status post ultrasound guided access right common femoral vein for pulmonary angiogram/pressure measurements and mechanical/aspiration thrombectomy of bilateral pulmonary emboli for high risk submassive PE presentation. Signed, Dulcy Fanny. Nadene Rubins, RPVI [facility] Radiology Electronically Signed   By: Corrie Mckusick D.O.   On: 11/29/2021 15:19   IR Angiogram Pulmonary Bilateral Selective  Result Date: 11/29/2021 INDICATION: 71 year old male with submassive high-risk pulmonary embolism, presents for treatment EXAM: ULTRASOUND-GUIDED ACCESS RIGHT COMMON FEMORAL VEIN PULMONARY ANGIOGRAM MECHANICAL/ASPIRATION THROMBECTOMY OF BILATERAL PULMONARY ARTERIES PULMONARY ARTERY PRESSURE MEASUREMENTS COMPARISON:  None MEDICATIONS: Eight thousand IV units heparin 500 cc normal saline bolus ANESTHESIA/SEDATION: Moderate  (conscious) sedation was employed during this procedure. A total of Versed 1.0 mg and Fentanyl 25 mcg was administered intravenously by the radiology nurse. Total intra-service moderate Sedation Time: 106 minutes. The patient's level of consciousness and vital signs were monitored continuously by radiology nursing throughout the procedure under my direct supervision. FLUOROSCOPY: Radiation Exposure Index (as provided by the fluoroscopic device): 21 minutes, 18 seconds, [id number] mGy Kerma COMPLICATIONS: None TECHNIQUE: Informed consent was obtained from the patient and the patient's family following explanation of the procedure, risks, benefits and alternatives. Specific risks include bleeding, infection, contrast reaction, kidney injury, venous injury, life-threatening hemorrhage, need for further surgery, need for further procedure, cardiopulmonary collapse, death. The patient understands, agrees and consents for the procedure. All questions were addressed. Patient is positioned supine position on the fluoroscopy table. Maximal barrier sterile technique utilized including caps, mask, sterile gowns, sterile gloves, large sterile drape, hand hygiene, and chlorhexidine prep. 1% lidocaine used for local anesthesia. Moderate sedation was provided. Ultrasound survey of the right inguinal region was performed with images stored and sent to PACs, confirming patency of the right common femoral vein. A single wall needle was used access the right common femoral vein under ultrasound. With excellent blood flow returned, 035 wire was passed through the needle, observed to enter the IVC under fluoroscopy. The needle was removed, and a standard 6 [place] vascular sheath was  placed. The dilator was removed and the sheath was flushed. Combination of a angled pigtail catheter and a Bentson wire was then used to navigate to the right heart. Angled catheter used to select the main pulmonary artery. Wire was removed and angiogram was  performed. Pressure measurement was obtained. Stiff Glidewire was then advanced through the pigtail catheter, reducing the curve and flipping into the left-sided pulmonary artery. The wire was straightened into the downgoing branches of the right pulmonary artery. Pigtail catheter was then removed. The 6 French sheath was removed. 49 French dilation was performed and then an 5 [place] Gore dry seal sheath was placed. Over the wire, a coaxial 125 cm H1 diagnostic catheter with 16 French H torque aspiration catheter were advanced into the downgoing branches of the left pulmonary arteries. The diagnostic catheter was removed, and aspiration was initiated with the 16 [place] device. After the initial aspiration attempt, there was some hemodynamic instability, with oxygen saturation dropping into the low 16X, systolic blood pressure dropping into the mid 60s, and the aspiration catheter became occluded. Stepwise removal of the aspiration catheter over the Glidewire was performed, ultimately with removal completely from the system as well as removal of the [place] dry seal sheath from the venous access to clear the catheter of retained thrombus. Once the catheter and the sheath were cleared of residual thrombus/blood products the dry seal sheath was advanced on the Glidewire to the level of the right atrium. Introducer was removed on the wire. The angled pigtail catheter was then used to select the main pulmonary artery. Stiff Glidewire was a again advanced through the pigtail catheter, selecting the left-sided branches. Pigtail catheter was removed. Placement of the coaxial H torque 16 French device and the 125 cm catheter was again into the downgoing branches on the left. Once the aspiration catheter was in position the coaxial catheter was removed from the wire. Aspiration was then performed of the left-sided pulmonary artery and the segmental arteries into the downgoing branches. Catheter was removed from the system  for clearance. Once the sheath was clear to of all residual thrombus the pigtail catheter was advanced again through the right heart into the main pulmonary artery. A repeat pulmonary artery pressure was performed at this time. Repeat angiogram was performed at this time. Stiff Glidewire was then again advanced through the pigtail catheter into the right-sided branches. Pigtail catheter was removed. The coaxial diagnostic catheter and aspiration catheter were advanced into the right-sided branches. Catheter was removed on the wire. Aspiration thrombectomy was performed within the right-sided segmental branches. When the catheter became clogged with aspirated material, the aspiration catheter was removed from the sheath and cleared of all broad products. At this time the sheath was confirmed to be clogged with some retained blood products/thrombus. The sheath was removed on the stiff Glidewire. Both the aspiration catheter and the sheath were cleared of all blood products and thrombus material. The introducer was then placed through the Gore dry seal sheath and the sheath was reinserted on the Glidewire into the femoral vein. The sheath was position at the base of the right atrium. Introducer was removed. Pigtail catheter was then again advanced through the sheath into the main pulmonary artery. Final pressure measurement was performed. At this time the pressure measurement had nearly normalized, to a mean pressure of 26. Additionally the patient's initial 15 L oxygen requirement was now decreased to 3 L for 100% oxygen saturation. We elected to terminate the case at this point. All catheters  and wires were removed. The sheath was withdrawn and a stay suture was placed at the femoral vein puncture site. Manual pressure was performed for 20 minutes and then a compression dressing was added. The patient tolerated the procedure well. No complications were encountered. Estimated blood loss 300 cc. FINDINGS: Initial PA  pressure: 54/35 (42) After left PA treatment: 42/30 (35) Final PA pressure after left & right treatment: 34/21 (26) IMPRESSION: Status post ultrasound guided access right common femoral vein for pulmonary angiogram/pressure measurements and mechanical/aspiration thrombectomy of bilateral pulmonary emboli for high risk submassive PE presentation. Signed, Dulcy Fanny. Nadene Rubins, RPVI [facility] Radiology Electronically Signed   By: Corrie Mckusick D.O.   On: 11/29/2021 15:19   IR Angiogram Selective Each Additional Vessel  Result Date: 11/29/2021 INDICATION: 71 year old male with submassive high-risk pulmonary embolism, presents for treatment EXAM: ULTRASOUND-GUIDED ACCESS RIGHT COMMON FEMORAL VEIN PULMONARY ANGIOGRAM MECHANICAL/ASPIRATION THROMBECTOMY OF BILATERAL PULMONARY ARTERIES PULMONARY ARTERY PRESSURE MEASUREMENTS COMPARISON:  None MEDICATIONS: Eight thousand IV units heparin 500 cc normal saline bolus ANESTHESIA/SEDATION: Moderate (conscious) sedation was employed during this procedure. A total of Versed 1.0 mg and Fentanyl 25 mcg was administered intravenously by the radiology nurse. Total intra-service moderate Sedation Time: 106 minutes. The patient's level of consciousness and vital signs were monitored continuously by radiology nursing throughout the procedure under my direct supervision. FLUOROSCOPY: Radiation Exposure Index (as provided by the fluoroscopic device): 21 minutes, 18 seconds, [id number] mGy Kerma COMPLICATIONS: None TECHNIQUE: Informed consent was obtained from the patient and the patient's family following explanation of the procedure, risks, benefits and alternatives. Specific risks include bleeding, infection, contrast reaction, kidney injury, venous injury, life-threatening hemorrhage, need for further surgery, need for further procedure, cardiopulmonary collapse, death. The patient understands, agrees and consents for the procedure. All  questions were addressed. Patient is positioned supine position on the fluoroscopy table. Maximal barrier sterile technique utilized including caps, mask, sterile gowns, sterile gloves, large sterile drape, hand hygiene, and chlorhexidine prep. 1% lidocaine used for local anesthesia. Moderate sedation was provided. Ultrasound survey of the right inguinal region was performed with images stored and sent to PACs, confirming patency of the right common femoral vein. A single wall needle was used access the right common femoral vein under ultrasound. With excellent blood flow returned, 035 wire was passed through the needle, observed to enter the IVC under fluoroscopy. The needle was removed, and a standard 6 [place] vascular sheath was placed. The dilator was removed and the sheath was flushed. Combination of a angled pigtail catheter and a Bentson wire was then used to navigate to the right heart. Angled catheter used to select the main pulmonary artery. Wire was removed and angiogram was performed. Pressure measurement was obtained. Stiff Glidewire was then advanced through the pigtail catheter, reducing the curve and flipping into the left-sided pulmonary artery. The wire was straightened into the downgoing branches of the right pulmonary artery. Pigtail catheter was then removed. The 6 French sheath was removed. 75 French dilation was performed and then an 58 [place] Gore dry seal sheath was placed. Over the wire, a coaxial 125 cm H1 diagnostic catheter with 16 [place] H torque aspiration catheter were advanced into the downgoing branches of the left pulmonary arteries. The diagnostic catheter was removed, and aspiration was initiated with the 16 [place] device. After the initial aspiration attempt, there was some hemodynamic instability, with oxygen saturation dropping into the low 123XX123, systolic blood pressure dropping into the mid 60s, and  the aspiration catheter became occluded. Stepwise removal of the  aspiration catheter over the Glidewire was performed, ultimately with removal completely from the system as well as removal of the [place] dry seal sheath from the venous access to clear the catheter of retained thrombus. Once the catheter and the sheath were cleared of residual thrombus/blood products the dry seal sheath was advanced on the Glidewire to the level of the right atrium. Introducer was removed on the wire. The angled pigtail catheter was then used to select the main pulmonary artery. Stiff Glidewire was a again advanced through the pigtail catheter, selecting the left-sided branches. Pigtail catheter was removed. Placement of the coaxial H torque 16 French device and the 125 cm catheter was again into the downgoing branches on the left. Once the aspiration catheter was in position the coaxial catheter was removed from the wire. Aspiration was then performed of the left-sided pulmonary artery and the segmental arteries into the downgoing branches. Catheter was removed from the system for clearance. Once the sheath was clear to of all residual thrombus the pigtail catheter was advanced again through the right heart into the main pulmonary artery. A repeat pulmonary artery pressure was performed at this time. Repeat angiogram was performed at this time. Stiff Glidewire was then again advanced through the pigtail catheter into the right-sided branches. Pigtail catheter was removed. The coaxial diagnostic catheter and aspiration catheter were advanced into the right-sided branches. Catheter was removed on the wire. Aspiration thrombectomy was performed within the right-sided segmental branches. When the catheter became clogged with aspirated material, the aspiration catheter was removed from the sheath and cleared of all broad products. At this time the sheath was confirmed to be clogged with some retained blood products/thrombus. The sheath was removed on the stiff Glidewire. Both the aspiration catheter and  the sheath were cleared of all blood products and thrombus material. The introducer was then placed through the [place] dry seal sheath and the sheath was reinserted on the Glidewire into the femoral vein. The sheath was position at the base of the right atrium. Introducer was removed. Pigtail catheter was then again advanced through the sheath into the main pulmonary artery. Final pressure measurement was performed. At this time the pressure measurement had nearly normalized, to a mean pressure of 26. Additionally the patient's initial 15 L oxygen requirement was now decreased to 3 L for 100% oxygen saturation. We elected to terminate the case at this point. All catheters and wires were removed. The sheath was withdrawn and a stay suture was placed at the femoral vein puncture site. Manual pressure was performed for 20 minutes and then a compression dressing was added. The patient tolerated the procedure well. No complications were encountered. Estimated blood loss 300 cc. FINDINGS: Initial PA pressure: 54/35 (42) After left PA treatment: 42/30 (35) Final PA pressure after left & right treatment: 34/21 (26) IMPRESSION: Status post ultrasound guided access right common femoral vein for pulmonary angiogram/pressure measurements and mechanical/aspiration thrombectomy of bilateral pulmonary emboli for high risk submassive PE presentation. Signed, Dulcy Fanny. Nadene Rubins, RPVI [facility]iology Special[facility] Radiology Electronically Signed   By: Corrie Mckusick D.O.   On: 11/29/2021 15:19   IR Angiogram Selective Each Additional Vessel  Result Date: 11/29/2021 INDICATION: 71 year old male with submassive high-risk pulmonary embolism, presents for treatment EXAM: ULTRASOUND-GUIDED ACCESS RIGHT COMMON FEMORAL VEIN PULMONARY ANGIOGRAM MECHANICAL/ASPIRATION THROMBECTOMY OF BILATERAL PULMONARY ARTERIES PULMONARY ARTERY PRESSURE MEASUREMENTS COMPARISON:  None MEDICATIONS: Eight thousand IV units  heparin 500 cc normal saline bolus ANESTHESIA/SEDATION: Moderate (conscious) sedation was employed during this procedure. A total of Versed 1.0 mg and Fentanyl 25 mcg was administered intravenously by the radiology nurse. Total intra-service moderate Sedation Time: 106 minutes. The patient's level of consciousness and vital signs were monitored continuously by radiology nursing throughout the procedure under my direct supervision. FLUOROSCOPY: Radiation Exposure Index (as provided by the fluoroscopic device): 21 minutes, 18 seconds, [id number] mGy Kerma COMPLICATIONS: None TECHNIQUE: Informed consent was obtained from the patient and the patient's family following explanation of the procedure, risks, benefits and alternatives. Specific risks include bleeding, infection, contrast reaction, kidney injury, venous injury, life-threatening hemorrhage, need for further surgery, need for further procedure, cardiopulmonary collapse, death. The patient understands, agrees and consents for the procedure. All questions were addressed. Patient is positioned supine position on the fluoroscopy table. Maximal barrier sterile technique utilized including caps, mask, sterile gowns, sterile gloves, large sterile drape, hand hygiene, and chlorhexidine prep. 1% lidocaine used for local anesthesia. Moderate sedation was provided. Ultrasound survey of the right inguinal region was performed with images stored and sent to PACs, confirming patency of the right common femoral vein. A single wall needle was used access the right common femoral vein under ultrasound. With excellent blood flow returned, 035 wire was passed through the needle, observed to enter the IVC under fluoroscopy. The needle was removed, and a standard 6 [place] vascular sheath was placed. The dilator was removed and the sheath was flushed. Combination of a angled pigtail catheter and a Bentson wire was then used to navigate to the right heart. Angled catheter used to select the  main pulmonary artery. Wire was removed and angiogram was performed. Pressure measurement was obtained. Stiff Glidewire was then advanced through the pigtail catheter, reducing the curve and flipping into the left-sided pulmonary artery. The wire was straightened into the downgoing branches of the right pulmonary artery. Pigtail catheter was then removed. The 6 French sheath was removed. 75 French dilation was performed and then an 62 [place] Gore dry seal sheath was placed. Over the wire, a coaxial 125 cm H1 diagnostic catheter with 16 [place] H torque aspiration catheter were advanced into the downgoing branches of the left pulmonary arteries. The diagnostic catheter was removed, and aspiration was initiated with the 16 [place] device. After the initial aspiration attempt, there was some hemodynamic instability, with oxygen saturation dropping into the low 123XX123, systolic blood pressure dropping into the mid 60s, and the aspiration catheter became occluded. Stepwise removal of the aspiration catheter over the Glidewire was performed, ultimately with removal completely from the system as well as removal of the [place] dry seal sheath from the venous access to clear the catheter of retained thrombus. Once the catheter and the sheath were cleared of residual thrombus/blood products the dry seal sheath was advanced on the Glidewire to the level of the right atrium. Introducer was removed on the wire. The angled pigtail catheter was then used to select the main pulmonary artery. Stiff Glidewire was a again advanced through the pigtail catheter, selecting the left-sided branches. Pigtail catheter was removed. Placement of the coaxial H torque 16 French device and the 125 cm catheter was again into the downgoing branches on the left. Once the aspiration catheter was in position the coaxial catheter was removed from the wire. Aspiration was then performed of the left-sided pulmonary artery and the segmental arteries into the  downgoing branches. Catheter was removed from the system for clearance. Once the sheath was  clear to of all residual thrombus the pigtail catheter was advanced again through the right heart into the main pulmonary artery. A repeat pulmonary artery pressure was performed at this time. Repeat angiogram was performed at this time. Stiff Glidewire was then again advanced through the pigtail catheter into the right-sided branches. Pigtail catheter was removed. The coaxial diagnostic catheter and aspiration catheter were advanced into the right-sided branches. Catheter was removed on the wire. Aspiration thrombectomy was performed within the right-sided segmental branches. When the catheter became clogged with aspirated material, the aspiration catheter was removed from the sheath and cleared of all broad products. At this time the sheath was confirmed to be clogged with some retained blood products/thrombus. The sheath was removed on the stiff Glidewire. Both the aspiration catheter and the sheath were cleared of all blood products and thrombus material. The introducer was then placed through the Gore dry seal sheath and the sheath was reinserted on the Glidewire into the femoral vein. The sheath was position at the base of the right atrium. Introducer was removed. Pigtail catheter was then again advanced through the sheath into the main pulmonary artery. Final pressure measurement was performed. At this time the pressure measurement had nearly normalized, to a mean pressure of 26. Additionally the patient's initial 15 L oxygen requirement was now decreased to 3 L for 100% oxygen saturation. We elected to terminate the case at this point. All catheters and wires were removed. The sheath was withdrawn and a stay suture was placed at the femoral vein puncture site. Manual pressure was performed for 20 minutes and then a compression dressing was added. The patient tolerated the procedure well. No complications were  encountered. Estimated blood loss 300 cc. FINDINGS: Initial PA pressure: 54/35 (42) After left PA treatment: 42/30 (35) Final PA pressure after left & right treatment: 34/21 (26) IMPRESSION: Status post ultrasound guided access right common femoral vein for pulmonary angiogram/pressure measurements and mechanical/aspiration thrombectomy of bilateral pulmonary emboli for high risk submassive PE presentation. Signed, Dulcy Fanny. Nadene Rubins, RPVI [facility] Radiology Electronically Signed   By: Corrie Mckusick D.O.   On: 11/29/2021 15:19   CT Angio Chest PE W and/or Wo Contrast  Result Date: 11/29/2021 CLINICAL DATA:  Syncopal episode with loss of consciousness and fall injury. EXAM: CT ANGIOGRAPHY CHEST WITH CONTRAST TECHNIQUE: Multidetector CT imaging of the chest was performed using the standard protocol during bolus administration of intravenous contrast. Multiplanar CT image reconstructions and MIPs were obtained to evaluate the vascular anatomy. RADIATION DOSE REDUCTION: This exam was performed according to the departmental dose-optimization program which includes automated exposure control, adjustment of the mA and/or kV according to patient size and/or use of iterative reconstruction technique. CONTRAST:  162mL OMNIPAQUE IOHEXOL 350 MG/ML SOLN COMPARISON:  Portable chest today, portable chest 03/07/2020, chest CT with contrast 12/01/2008. FINDINGS: Cardiovascular: There is mild cardiomegaly, no pericardial effusion and no visible coronary calcifications. There is elevated RV LV ratio of 1.49 and IVC reflux indicating right heart strain, prominent pulmonary trunk and main arteries with the pulmonary trunk 3.5 cm, and a large bilateral pulmonary arterial embolic burden. There is a band embolus partially filling the left main pulmonary artery, smaller band of embolus in the distal right main pulmonary artery. In the upper lobes there are multiple partially  occlusive segmental and subsegmental emboli. In the right lower lobe there are nonocclusive segmental and scattered occlusive subsegmental emboli to the basal segments, segmental embolus to the right  lower lobe superior segment, and multiple segmental and subsegmental occlusive emboli in the left lower lobe. There is a nonocclusive embolus in the right middle lobe interlobar artery. There is aortic tortuosity and moderate patchy atherosclerosis, scattered plaques in the great vessels without stenosis or dissection. Dilatation of the aortic root is seen up to 4.5 cm at the sinuses of Valsalva. The rest is within normal caliber limits. Pulmonary veins are normal caliber. Mediastinum/Nodes: No enlarged mediastinal, hilar, or axillary lymph nodes. Thyroid gland, trachea, and esophagus demonstrate no significant findings. Lungs/Pleura: There are patchy peripheral ground-glass opacities in the left upper and both lower lobes, moderate posterior atelectasis in the lower lobes. There is no pleural effusion, thickening or pneumothorax. Mild bronchial thickening in the lower lobes. Upper Abdomen: No acute finding. Small stones layer in the gallbladder. Musculoskeletal: No regional skeletal fracture is seen. Review of the MIP images confirms the above findings. IMPRESSION: Bilateral acute arterial emboli with large clot burden and right heart strain findings. Positive for acute PE with CT evidence of right heart strain (RV/LV Ratio = 1.49) consistent with at least submassive (intermediate risk) PE. The presence of right heart strain has been associated with an increased risk of morbidity and mortality. Please refer to the "Code PE Focused" order set in EPIC. Aortic atherosclerosis with 4.5 cm dilatation of the aortic root. Annual CTA or MRA follow-up and vascular surgery consult recommended. Mild cardiomegaly. Peripheral hazy opacities in the upper and lower lobes, most likely due to ongoing infarcts, less likely peripheral  pneumonitis. Background bronchitis. Cholelithiasis. Results phoned to Dr. Langston Masker in the ED at 7:48 a.m., 11/29/2021 with verbal acknowledgement of key findings. Electronically Signed   By: Telford Nab M.D.   On: 11/29/2021 08:05   CT Cervical Spine Wo Contrast  Result Date: 11/29/2021 CLINICAL DATA:  Fall getting into truck.  Trauma to head. EXAM: CT CERVICAL SPINE WITHOUT CONTRAST TECHNIQUE: Multidetector CT imaging of the cervical spine was performed without intravenous contrast. Multiplanar CT image reconstructions were also generated. RADIATION DOSE REDUCTION: This exam was performed according to the departmental dose-optimization program which includes automated exposure control, adjustment of the mA and/or kV according to patient size and/or use of iterative reconstruction technique. COMPARISON:  None Available. FINDINGS: Alignment: Slight retrolisthesis at C3-4 is degenerative. No traumatic listhesis is present. Straightening of the normal cervical lordosis is likely chronic. Skull base and vertebrae: Craniocervical junction is within normal limits. No acute fractures are present. Soft tissues and spinal canal: No prevertebral fluid or swelling. No visible canal hematoma. Disc levels: Moderate to severe left foraminal stenosis present at C3-4 and C4-5 due to uncovertebral and facet spurring. Moderate bilateral foraminal narrowing scratched at severe right and moderate left foraminal narrowing is present at C5-6. Upper chest: The lung apices are clear. Thoracic inlet is within normal limits. IMPRESSION: 1. No acute fracture or traumatic listhesis. 2. Multilevel degenerative disc disease as described. These results were called by telephone at the time of interpretation on 11/29/2021 at 8:04 am to provider Dr. Langston Masker, who verbally acknowledged these results. Electronically Signed   By: San Morelle M.D.   On: 11/29/2021 08:05   CT Head Wo Contrast  Result Date: 11/29/2021 CLINICAL DATA:  Head  trauma. [place] getting into his truck. Abrasion to left side of forehead and cheek. EXAM: CT HEAD WITHOUT CONTRAST TECHNIQUE: Contiguous axial images were obtained from the base of the skull through the vertex without intravenous contrast. RADIATION DOSE REDUCTION: This exam was performed according to the  departmental dose-optimization program which includes automated exposure control, adjustment of the mA and/or kV according to patient size and/or use of iterative reconstruction technique. COMPARISON:  None Available. FINDINGS: Brain: No acute infarct, hemorrhage, or mass lesion is present. Atrophy and white matter changes are mildly advanced for age. Basal ganglia are intact. No acute or focal cortical abnormalities are present. The ventricles are of normal size. No significant extraaxial fluid collection is present. 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 The splenium of the corpus callosum is incompletely formed. No other midline abnormalities are present. The brainstem and cerebellum are within normal limits. Vascular: No hyperdense vessel or unexpected calcification. Skull: A left supraorbital and frontal scalp laceration and hematoma is present. No underlying fracture or foreign body is present. Soft tissue swelling extends along the lateral left orbit and over the left maxilla. The zygomatic arch is intact. Sinuses/Orbits: The paranasal sinuses and mastoid air cells are clear. The globes and orbits are within normal limits. IMPRESSION: 1. Left supraorbital and frontal scalp laceration and hematoma without underlying fracture or foreign body. 2. Soft tissue swelling extends along the lateral left orbit and over the left maxilla. 3. No acute intracranial abnormality. 4. Atrophy and white matter changes are mildly advanced for age. This likely reflects the sequela of chronic microvascular ischemia. These results were called by telephone at the time of interpretation on 11/29/2021 at 8:01 am  to provider Dr. Langston Masker, who verbally acknowledged these results. Electronically Signed   By: San Morelle M.D.   On: 11/29/2021 08:02   CT ABDOMEN PELVIS W CONTRAST  Result Date: 11/29/2021 CLINICAL DATA:  [place] getting into truck today.  Trauma to head. EXAM: CT ABDOMEN AND PELVIS WITH CONTRAST TECHNIQUE: Multidetector CT imaging of the abdomen and pelvis was performed using the standard protocol following bolus administration of intravenous contrast. RADIATION DOSE REDUCTION: This exam was performed according to the departmental dose-optimization program which includes automated exposure control, adjustment of the mA and/or kV according to patient size and/or use of iterative reconstruction technique. CONTRAST:  137mL OMNIPAQUE IOHEXOL 350 MG/ML SOLN COMPARISON:  CT a of the chest 11/29/2021. CT abdomen pelvis 12/10/2008 FINDINGS: Lower chest: Pulmonary artery are noted at the scratched at pulmonary emboli are noted in the right main outflow tract and left lower lobar pulmonary arteries. Right heart strain is evident. Minimal scarring or atelectasis is present at both lung bases. No pleural effusion is present. Hepatobiliary: Small layering stones are present at the neck of the gallbladder. No inflammatory changes are present. Liver is unremarkable. No acute trauma. Pancreas: Unremarkable. No pancreatic ductal dilatation or surrounding inflammatory changes. Spleen: No splenic injury or perisplenic hematoma. Adrenals/Urinary Tract: No adrenal hemorrhage or renal injury identified. Bladder is unremarkable. A 17 mm simple cyst is present posterior left kidney. Recommend no imaging follow-up. Stomach/Bowel: Stomach and duodenum are within normal limits. Small bowel is unremarkable. Terminal ileum is within normal limits. The appendix is visualized and normal. Diverticular present in the ascending colon near the hepatic flexure. Additional diverticular present through the transverse colon and particularly in  the distal descending and sigmoid colon without inflammation to suggest diverticulitis. Vascular/Lymphatic: Atherosclerotic calcifications are present in the aorta and branch vessels. Maximal diameter is 2.4 cm. No aneurysm is present. No focal stenosis is evident. No significant adenopathy is present. Reproductive: Prostate is unremarkable.  Bilateral hydrocele noted. Other: Fat herniates into the inguinal canals bilaterally. No other significant ventral hernia is present. No bowel herniates. Musculoskeletal: Vertebral  body heights and alignment are normal. No acute fracture or traumatic subluxation is present. Degenerative changes are present in the lumbar spine with left greater than right stenosis at L4-5 and L5-S1. The bony pelvis is within normal limits. The hips are located and normal. IMPRESSION: 1. No acute trauma to the abdomen or pelvis. 2. Bilateral pulmonary emboli with right heart strain. 3. Cholelithiasis without evidence for cholecystitis. 4. Extensive colonic diverticulosis without diverticulitis. 5. Fat herniates into the inguinal canals bilaterally. No bowel herniates. 6. Degenerative changes in the lumbar spine with left greater than right stenosis at L4-5 and L5-S1. 7. Aortic Atherosclerosis (ICD10-I70.0). Critical Value/emergent results were called by telephone at the time of interpretation on 11/29/2021 at 7:58 am to provider Dr. Langston Masker, who verbally acknowledged these results. Electronically Signed   By: San Morelle M.D.   On: 11/29/2021 07:58   DG Chest Portable 1 View  Result Date: 11/29/2021 CLINICAL DATA:  Loss of consciousness, lightheadedness. EXAM: PORTABLE CHEST 1 VIEW COMPARISON:  Portable chest 03/07/2020. FINDINGS: There is mild cardiomegaly. No vascular congestion is seen. There is aortic calcification with stable mediastinal configuration. The lungs are clear of infiltrates. There is linear scarring right mid field along the short fissure. No pleural effusion is seen.  There is thoracic spondylosis. The thoracic cage intact. There is overlying monitor wiring. IMPRESSION: No acute chest findings. Mild cardiomegaly. Aortic atherosclerosis. Compare: Interval resolution of prior bilateral peripheral infiltrates. Electronically Signed   By: Telford Nab M.D.   On: 11/29/2021 06:25    ASSESSMENT AND PLAN:   AUDRICK GENCO is a 71 y.o. male with pmh of hypertension and recurrent unprovoked PE and DVT was referred to hematology for thrombophilia work-up.   # Acute saddle PE with right heart strain  - diagnosed on 11/29/2021 after he had a syncopal episode. -s/p IR mechanical thrombectomy. -On Eliquis 5 mg twice daily.  Tolerating well. -Discussed with the patient and daughter that with his history of recurrent PE and DVT with 2 episodes being unprovoked and most recent episode with large clot burden would recommend long-term anticoagulation as long as patient is tolerating well without any complications.  Patient was agreeable.  Patient and family is interested in pursuing hypercoag work-up.  Advised to monitor for any bleeding in stool urine, nose or gum bleeding.  Also if there are any falls or trauma provider should be notified.  Orders Placed This Encounter  Procedures   Antithrombin III   Protein C activity   Protein C, total   Protein S activity   Protein S, total   Beta-2-glycoprotein i abs, IgG/M/A   Factor 5 leiden   Prothrombin gene mutation   Cardiolipin antibodies, IgG, IgM, IgA   Hexagonal Phase Phospholipid    # Unprovoked DVT in 2008 -Was treated with anticoagulation for 6 to 12 months.  #DVT in 2021, in setting of COVID -Was treated with full dose Eliquis for 6 months.  Then transition to prophylactic dose of 2.5 mg twice daily until March 2023.  RTC in 3 weeks for televideo visit to discuss labs.  Patient expressed understanding and was in agreement with this plan. He also understands that He can call clinic at any time with any  questions, concerns, or complaints.   I spent a total of 45 minutes reviewing chart data, face-to-face evaluation with the patient, counseling and coordination of care as detailed above.  Jane Canary, MD   12/15/2021 11:46 AM

## 2021-12-16 LAB — CARDIOLIPIN ANTIBODIES, IGG, IGM, IGA
Anticardiolipin IgA: <9 APL U/mL (ref 0–11)
Anticardiolipin IgG: 12 GPL U/mL (ref 0–14)
Anticardiolipin IgM: <9 MPL U/mL (ref 0–12)

## 2021-12-17 LAB — BETA-2-GLYCOPROTEIN I ABS, IGG/M/A
Beta-2 Glyco I IgG: <9 GPI IgG units (ref 0–20)
Beta-2-Glycoprotein I IgA: <9 GPI IgA units (ref 0–25)
Beta-2-Glycoprotein I IgM: <9 GPI IgM units (ref 0–32)

## 2021-12-17 LAB — PROTEIN C, TOTAL: Protein C, Total: 80 % (ref 60–150)

## 2021-12-18 LAB — HEXAGONAL PHASE PHOSPHOLIPID: Hex Phosph Neut Test: 3 s (ref 0–11)

## 2021-12-18 LAB — PROTEIN C ACTIVITY: Protein C Activity: 94 % (ref 73–180)

## 2021-12-18 LAB — PROTEIN S ACTIVITY: Protein S Activity: 106 % (ref 63–140)

## 2021-12-18 LAB — PROTEIN S, TOTAL: Protein S Ag, Total: 122 % (ref 60–150)

## 2021-12-18 LAB — HEX PHASE PHOSPHOLIPID REFLEX

## 2021-12-20 LAB — PROTHROMBIN GENE MUTATION

## 2021-12-20 LAB — FACTOR 5 LEIDEN

## 2022-01-04 ENCOUNTER — Inpatient Hospital Stay: Payer: Managed Care, Other (non HMO) | Attending: Internal Medicine | Admitting: Internal Medicine

## 2022-01-04 ENCOUNTER — Encounter: Payer: Self-pay | Admitting: Internal Medicine

## 2022-01-04 DIAGNOSIS — I824Y2 Acute embolism and thrombosis of unspecified deep veins of left proximal lower extremity: Secondary | ICD-10-CM | POA: Diagnosis not present

## 2022-01-04 DIAGNOSIS — I2692 Saddle embolus of pulmonary artery without acute cor pulmonale: Secondary | ICD-10-CM

## 2022-01-04 MED ORDER — APIXABAN 5 MG PO TABS: 5.0000 mg | ORAL_TABLET | Freq: Two times a day (BID) | ORAL | 2 refills | Status: DC

## 2022-01-04 NOTE — Progress Notes (Signed)
**Note Paul-Identified via Obfuscation** [place]  [phone] [phone]  I connected with  DEVREN RYTHER on 01/04/22 by a video enabled telemedicine application and verified that I am speaking with the correct person using two identifiers.   I discussed the limitations of evaluation and management by telemedicine. The patient expressed understanding and agreed to proceed.   ID: Paul Mitchell OB: [birth year]  MR#: [id number]  CSN#:[id number]  Patient Care Team: Sofie Hartigan, MD as PCP - General (Family Medicine)  REFERRING PROVIDER: Dr. [name]  REASON FOR REFERRAL: recurrent unprovoked PE/DVT  HPI: Paul Mitchell is a 71 y.o. male with past medical history of hypertension and recurrent unprovoked PE and DVT was referred to hematology for thrombophilia work-up.  Patient was admitted from 11/29/2021 to 12/03/2021 after a syncopal episode and shortness of breath.  CTA chest showed bilateral acute arterial thrombus with large clot burden and right heart strain.  He required 15 L nonrebreather oxygen.  He had urgent mechanical thrombectomy and was on IV contrast.  Postprocedure, he had improvement in his oxygen levels.  He was transitioned to Eliquis on discharge.  Denies any long distance travel, recent surgery or trauma.  He is tolerating Eliquis well.  He reports history of first episode of unprovoked DVT in 2008 when he was treated with anticoagulation for 6 to 12 months.  He had a second episode of DVT in 2021 when he was admitted for [place].  He was treated with 6 months of anticoagulation and then was on prophylactic dose of 2.5 mg twice daily until March 2023.  Denies any family history of clot.  INTERVAL HISTORY-  I had my chart video with the patient today to discuss about thrombophilia work-up. He is doing well overall.  Tolerating Eliquis.  Denies any bleeding.  REVIEW OF SYSTEMS:   Review of Systems  All other systems reviewed and are negative.   As per  HPI. Otherwise, a complete review of systems is negative.  PAST MEDICAL HISTORY: Past Medical History:  Diagnosis Date   Hypertension     PAST SURGICAL HISTORY: Past Surgical History:  Procedure Laterality Date   IR ANGIOGRAM PULMONARY BILATERAL SELECTIVE  11/29/2021   IR ANGIOGRAM SELECTIVE EACH ADDITIONAL VESSEL  11/29/2021   IR ANGIOGRAM SELECTIVE EACH ADDITIONAL VESSEL  11/29/2021   IR THROMBECT PRIM MECH INIT (INCLU) MOD SED  11/29/2021   IR THROMBECT PRIM MECH INIT (INCLU) MOD SED  11/29/2021   IR US GUIDE VASC ACCESS RIGHT  11/29/2021    FAMILY HISTORY: No family history on file.  HEALTH MAINTENANCE: Social History   Tobacco Use   Smoking status: Never   Smokeless tobacco: Never  Substance Use Topics   Alcohol use: Not Currently   Drug use: Not Currently     Allergies  Allergen Reactions   Enalapril Cough    Current Outpatient Medications  Medication Sig Dispense Refill   acetaminophen (TYLENOL) 325 MG tablet Take 2 tablets (650 mg total) by mouth every 6 (six) hours as needed for headache or moderate pain. 20 tablet 0   albuterol (VENTOLIN HFA) 108 (90 Base) MCG/ACT inhaler Inhale 2 puffs into the lungs every 6 (six) hours as needed for wheezing or shortness of breath. 8 g 0   apixaban (ELIQUIS) 5 MG TABS tablet Take 2 tablets (10 mg total) by mouth 2 (two) times daily for 6 days, THEN 1 tablet (5 mg total) 2 (two) times daily. 84 tablet 0   docusate sodium (COLACE) 100  MG capsule Take 1 capsule (100 mg total) by mouth 2 (two) times daily as needed for mild constipation. 10 capsule 0   furosemide (LASIX) 20 MG tablet Take 1 tablet (20 mg total) by mouth daily as needed for fluid or edema. 30 tablet 0   pantoprazole (PROTONIX) 40 MG tablet Take 1 tablet (40 mg total) by mouth daily. 30 tablet 0   polyethylene glycol (MIRALAX / GLYCOLAX) 17 g packet Take 17 g by mouth daily as needed for moderate constipation. 14 each 0   No current facility-administered medications for  this visit.    OBJECTIVE: There were no vitals filed for this visit.    There is no height or weight on file to calculate BMI.      Physical exam not performed   LAB RESULTS:  Lab Results  Component Value Date   NA 140 12/03/2021   K 4.7 12/03/2021   CL 112 (H) 12/03/2021   CO2 23 12/03/2021   GLUCOSE 128 (H) 12/03/2021   BUN 18 12/03/2021   CREATININE 1.07 12/03/2021   CALCIUM 8.4 (L) 12/03/2021   PROT 5.7 (L) 12/03/2021   ALBUMIN 2.8 (L) 12/03/2021   AST 18 12/03/2021   ALT 15 12/03/2021   ALKPHOS 60 12/03/2021   BILITOT 0.7 12/03/2021   GFRNONAA >60 12/03/2021    Lab Results  Component Value Date   WBC 8.2 12/03/2021   NEUTROABS 4.6 12/03/2021   HGB 11.8 (L) 12/03/2021   HCT 35.6 (L) 12/03/2021   MCV 90.4 12/03/2021   PLT 185 12/03/2021    Lab Results  Component Value Date   FERRITIN 497 (H) 03/08/2020   FERRITIN 528 (H) 03/07/2020   FERRITIN 580 (H) 03/06/2020     STUDIES: No results found.  ASSESSMENT AND PLAN:   Paul Mitchell is a 71 y.o. male with pmh of hypertension and recurrent unprovoked PE and DVT was referred to hematology for thrombophilia work-up.   # Acute saddle PE with right heart strain  - diagnosed on 11/29/2021 after he had a syncopal episode. -s/p IR mechanical thrombectomy. -On Eliquis 5 mg twice daily.  Tolerating well. -Thrombophilia work-up done in September 2023.  Factor V Leiden and prothrombin gene mutation negative.  Protein C, protein S, Antithrombin normal.  APLA antibodies normal.  -Discussed with the patient and daughter that with his history of recurrent PE and DVT with 2 episodes being unprovoked and most recent episode with large clot burden would recommend long-term anticoagulation as long as patient is tolerating well without any complications.  Patient was agreeable.  Patient and family is interested in pursuing hypercoag work-up.  Advised to monitor for any bleeding in stool urine, nose or gum bleeding.  Also if  there are any falls or trauma provider should be notified.  -I will send 19-month supply of Eliquis.  He would like to continue with pulmonary and PCP.  He knows he can reach out to me and follow-up with me if unable to get Eliquis refills.  # Unprovoked DVT in 2008 -Was treated with anticoagulation for 6 to 12 months.  #DVT in 2021, in setting of COVID -Was treated with full dose Eliquis for 6 months.  Then transition to prophylactic dose of 2.5 mg twice daily until March 2023.  RTC as needed.  Patient expressed understanding and was in agreement with this plan. He also understands that He can call clinic at any time with any questions, concerns, or complaints.   I spent a total of  45 minutes reviewing chart data, face-to-face evaluation with the patient, counseling and coordination of care as detailed above.  Jane Canary, MD   01/04/2022 3:47 PM

## 2022-01-05 ENCOUNTER — Telehealth: Payer: Managed Care, Other (non HMO) | Admitting: Internal Medicine

## 2022-01-22 ENCOUNTER — Encounter (INDEPENDENT_AMBULATORY_CARE_PROVIDER_SITE_OTHER): Payer: Self-pay

## 2022-03-15 ENCOUNTER — Other Ambulatory Visit (INDEPENDENT_AMBULATORY_CARE_PROVIDER_SITE_OTHER): Payer: Self-pay | Admitting: Nurse Practitioner

## 2022-03-15 DIAGNOSIS — Z86718 Personal history of other venous thrombosis and embolism: Secondary | ICD-10-CM

## 2022-03-21 ENCOUNTER — Ambulatory Visit (INDEPENDENT_AMBULATORY_CARE_PROVIDER_SITE_OTHER): Payer: Managed Care, Other (non HMO) | Admitting: Nurse Practitioner

## 2022-03-21 ENCOUNTER — Ambulatory Visit (INDEPENDENT_AMBULATORY_CARE_PROVIDER_SITE_OTHER): Payer: Managed Care, Other (non HMO)

## 2022-03-21 ENCOUNTER — Encounter (INDEPENDENT_AMBULATORY_CARE_PROVIDER_SITE_OTHER): Payer: Self-pay | Admitting: Nurse Practitioner

## 2022-03-21 VITALS — BP 137/84 | HR 77 | Resp 16 | Wt 226.0 lb

## 2022-03-21 DIAGNOSIS — I10 Essential (primary) hypertension: Secondary | ICD-10-CM

## 2022-03-21 DIAGNOSIS — E785 Hyperlipidemia, unspecified: Secondary | ICD-10-CM | POA: Diagnosis not present

## 2022-03-21 DIAGNOSIS — Z86718 Personal history of other venous thrombosis and embolism: Secondary | ICD-10-CM

## 2022-03-21 DIAGNOSIS — I824Y2 Acute embolism and thrombosis of unspecified deep veins of left proximal lower extremity: Secondary | ICD-10-CM | POA: Diagnosis not present

## 2022-03-25 ENCOUNTER — Encounter (INDEPENDENT_AMBULATORY_CARE_PROVIDER_SITE_OTHER): Payer: Self-pay | Admitting: Nurse Practitioner

## 2022-03-25 NOTE — Progress Notes (Signed)
Subjective:    Patient ID: REG BIRCHER, male    DOB: 1950-05-23, 71 y.o.   MRN: [id number] Chief Complaint  Patient presents with   Follow-up    Ultrasound follow up    Paul Mitchell is a 71 year old male who presents today for evaluation of DVT due to recent pulmonary embolism.  The patient initially had a DVT in his left lower extremity in 2021.  This DVT resolved.  The patient was on Eliquis for 1 year and then subsequently transition to a maintenance dose of 2.5 mg twice daily.  However he notes that he was taken off Eliquis shortly before developing pulmonary embolism in September.  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  He notes that he is not driving for more than 30 to 45 minutes at a time.  He does endorse having worsening swelling in his left lower extremity.  Per the patient's wife the swelling has been going on for months prior to his most recent episode with a pulmonary embolism.  The patient underwent a pulmonary thrombectomy at [facility].  He notes that his breathing is improved.  Although he is having swelling in his left lower extremity does not have any pain or discomfort.  He has been placed back on Eliquis 5 mg twice daily and is tolerating this well.  He currently does utilize medical grade compression stockings.  Today noninvasive studies show that he has evidence of a acute DVT in the left femoral and popliteal veins.  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  He also has a.  Venous system.  His previous study in 2021 showed resolution of his previous DVT.    Review of Systems  Cardiovascular:  Positive for leg swelling.  All other systems reviewed and are negative.      Objective:   Physical Exam Vitals reviewed.  HENT:     Head: Normocephalic.  Cardiovascular:     Rate and Rhythm: Normal rate.  Pulmonary:     Effort: Pulmonary effort is normal.  Musculoskeletal:     Left lower leg:  Edema present.  Skin:    General: Skin is warm and dry.  Neurological:     Mental Status: He is alert and oriented to person, place, and time.  Psychiatric:        Mood and Affect: Mood normal.        Thought Content: Thought content normal.        Judgment: Judgment normal.     BP 137/84 (BP Location: Right Arm)   Pulse 77   Resp 16   Wt 226 lb (102.5 kg)   BMI 29.02 kg/m   Past Medical History:  Diagnosis Date   Hypertension     Social History   Socioeconomic History   Marital status: Married    Spouse name: Not on file   Number of children: Not on file   Years of education: Not on file   Highest education level: Not on file  Occupational History   Not on file  Tobacco Use   Smoking status: Never   Smokeless tobacco: Never  Substance and Sexual Activity   Alcohol use: Not Currently   Drug use: Not Currently   Sexual activity: Not on file  Other Topics Concern   Not on file  Social History Narrative   Not on file   Social Determinants of Health   Financial  Resource Strain: Not on file  Food Insecurity: Not on file  Transportation Needs: Not on file  Physical Activity: Not on file  Stress: Not on file  Social Connections: Not on file  Intimate Partner Violence: Not on file    Past Surgical History:  Procedure Laterality Date   IR ANGIOGRAM PULMONARY BILATERAL SELECTIVE  11/29/2021   IR ANGIOGRAM SELECTIVE EACH ADDITIONAL VESSEL  11/29/2021   IR ANGIOGRAM SELECTIVE EACH ADDITIONAL VESSEL  11/29/2021   IR THROMBECT PRIM MECH INIT (INCLU) MOD SED  11/29/2021   IR THROMBECT PRIM MECH INIT (INCLU) MOD SED  11/29/2021   IR US GUIDE VASC ACCESS RIGHT  11/29/2021    History reviewed. No pertinent family history.  Allergies  Allergen Reactions   Enalapril Cough       Latest Ref Rng & Units 12/03/2021    9:32 AM 12/02/2021   12:59 AM 12/01/2021    8:09 AM  CBC  WBC 4.0 - 10.5 K/uL 8.2  10.0  11.5   Hemoglobin 13.0 - 17.0 g/dL 11.8  10.5  11.3   Hematocrit 39.0 -  52.0 % 35.6  31.3  33.6   Platelets 150 - 400 K/uL 185  150  163       CMP     Component Value Date/Time   NA 140 12/03/2021 0932   K 4.7 12/03/2021 0932   CL 112 (H) 12/03/2021 0932   CO2 23 12/03/2021 0932   GLUCOSE 128 (H) 12/03/2021 0932   BUN 18 12/03/2021 0932   CREATININE 1.07 12/03/2021 0932   CALCIUM 8.4 (L) 12/03/2021 0932   PROT 5.7 (L) 12/03/2021 0932   ALBUMIN 2.8 (L) 12/03/2021 0932   AST 18 12/03/2021 0932   ALT 15 12/03/2021 0932   ALKPHOS 60 12/03/2021 0932   BILITOT 0.7 12/03/2021 0932   GFRNONAA >60 12/03/2021 0932     No results found.     Assessment & Plan:   1. Acute deep vein thrombosis (DVT) of proximal vein of left lower extremity (HCC) Today the patient does have evidence of a makes progress thrombus in his left lower extremity.  There are some components of chronic DVT as well as acute.  At the time of the patient's pulmonary embolism in September, he did not have any repeat ultrasounds done on his lower extremities.  I suspect that this thrombus was present at that time and it may have been the likely cause of his pulmonary embolism.  Currently it is not causing him any pain or discomfort.  Also given the likely age of this, lower extremity thrombectomy likely would not be of any drastic improvement.  Based on this do not recommend any further intervention but did recommend that he continue with his Eliquis.  Given that the patient subsequently developed pulmonary embolism shortly after stopping his Eliquis it is recommended that he maintain some sort of anticoagulation for his lifetime.  Will have the patient return in 3 months to reevaluate progress with lower extremity DVT.  2. Essential hypertension Continue antihypertensive medications as already ordered, these medications have been reviewed and there are no changes at this time.  3. Hyperlipidemia, unspecified hyperlipidemia type Continue control with diet and activity   Current Outpatient  Medications on File Prior to Visit  Medication Sig Dispense Refill   acetaminophen (TYLENOL) 325 MG tablet Take 2 tablets (650 mg total) by mouth every 6 (six) hours as needed for headache or moderate pain. 20 tablet 0   apixaban (ELIQUIS) 5  MG TABS tablet Take 1 tablet (5 mg total) by mouth 2 (two) times daily. 60 tablet 2   docusate sodium (COLACE) 100 MG capsule Take 1 capsule (100 mg total) by mouth 2 (two) times daily as needed for mild constipation. 10 capsule 0   furosemide (LASIX) 20 MG tablet Take 1 tablet (20 mg total) by mouth daily as needed for fluid or edema. 30 tablet 0   pantoprazole (PROTONIX) 40 MG tablet Take 1 tablet (40 mg total) by mouth daily. 30 tablet 0   polyethylene glycol (MIRALAX / GLYCOLAX) 17 g packet Take 17 g by mouth daily as needed for moderate constipation. 14 each 0   albuterol (VENTOLIN HFA) 108 (90 Base) MCG/ACT inhaler Inhale 2 puffs into the lungs every 6 (six) hours as needed for wheezing or shortness of breath. (Patient not taking: Reported on 03/21/2022) 8 g 0   apixaban (ELIQUIS) 5 MG TABS tablet Take 2 tablets (10 mg total) by mouth 2 (two) times daily for 6 days, THEN 1 tablet (5 mg total) 2 (two) times daily. 84 tablet 0   No current facility-administered medications on file prior to visit.    There are no Patient Instructions on file for this visit. No follow-ups on file.   Kris Hartmann, NP

## 2022-05-08 ENCOUNTER — Other Ambulatory Visit: Payer: Self-pay | Admitting: Internal Medicine

## 2022-05-08 DIAGNOSIS — I2692 Saddle embolus of pulmonary artery without acute cor pulmonale: Secondary | ICD-10-CM

## 2022-05-08 DIAGNOSIS — I824Y2 Acute embolism and thrombosis of unspecified deep veins of left proximal lower extremity: Secondary | ICD-10-CM

## 2022-05-29 NOTE — Progress Notes (Unsigned)
MRN : [id number]  Paul Mitchell is a 72 y.o. (01/24/1951) male who presents with chief complaint of legs hurt and swell.  History of Present Illness:   The patient returns today for evaluation of DVT in association with  pulmonary embolism.  The patient initially had a DVT in his left lower extremity in 2021.  This DVT resolved.  The patient was on Eliquis for 1 year and then subsequently transition to a maintenance dose of 2.5 mg twice daily.  However he notes that he was taken off Eliquis shortly before developing pulmonary embolism in September, 2023.  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  He notes that he is not driving for more than 30 to 45 minutes at a time.  He does endorse having worsening swelling in his left lower extremity.  Per the patient's wife the swelling has been going on for months prior to his most recent episode with a pulmonary embolism.  The patient underwent a pulmonary thrombectomy at [facility].  He notes that his breathing is improved.  Although he is having swelling in his left lower extremity does not have any pain or discomfort.  He has been placed back on Eliquis 5 mg twice daily and is tolerating this well.  He currently does utilize medical grade compression stockings.   Today noninvasive studies show that he has evidence of a acute DVT in the left femoral and popliteal veins.  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   His previous study in 2021 showed resolution of his previous DVT.  No outpatient medications have been marked as taking for the 05/31/22 encounter (Appointment) with Delana Meyer, Dolores Lory, MD.    Past Medical History:  Diagnosis Date   Hypertension     Past Surgical History:  Procedure Laterality Date   IR ANGIOGRAM PULMONARY BILATERAL SELECTIVE  11/29/2021   IR ANGIOGRAM SELECTIVE EACH ADDITIONAL VESSEL  11/29/2021   IR ANGIOGRAM SELECTIVE EACH ADDITIONAL VESSEL   11/29/2021   IR THROMBECT PRIM MECH INIT (INCLU) MOD SED  11/29/2021   IR THROMBECT PRIM MECH INIT (INCLU) MOD SED  11/29/2021   IR US GUIDE VASC ACCESS RIGHT  11/29/2021    Social History Social History   Tobacco Use   Smoking status: Never   Smokeless tobacco: Never  Substance Use Topics   Alcohol use: Not Currently   Drug use: Not Currently    Family History No family history on file.  Allergies  Allergen Reactions   Enalapril Cough     REVIEW OF SYSTEMS (Negative unless checked)  Constitutional: '[]'$ Weight loss  '[]'$ Fever  '[]'$ Chills Cardiac: '[]'$ Chest pain   '[]'$ Chest pressure   '[]'$ Palpitations   '[]'$ Shortness of breath when laying flat   '[]'$ Shortness of breath with exertion. Vascular:  '[]'$ Pain in legs with walking   '[x]'$ Pain in legs at rest  '[]'$ History of DVT   '[]'$ Phlebitis   '[x]'$ Swelling in legs   '[]'$ Varicose veins   '[]'$ Non-healing ulcers Pulmonary:   '[]'$ Uses home oxygen   '[]'$ Productive cough   '[]'$ Hemoptysis   '[]'$ Wheeze  '[]'$ COPD   '[]'$ Asthma Neurologic:  '[]'$ Dizziness   '[]'$ Seizures   '[]'$ History of stroke   '[]'$ History of TIA  '[]'$ Aphasia   '[]'$ Vissual changes   '[]'$ Weakness or numbness in arm   '[]'$ Weakness or numbness in leg Musculoskeletal:   '[]'$ Joint swelling   '[]'$ Joint pain   '[]'$ Low back  pain Hematologic:  '[]'$ Easy bruising  '[]'$ Easy bleeding   '[]'$ Hypercoagulable state   '[]'$ Anemic Gastrointestinal:  '[]'$ Diarrhea   '[]'$ Vomiting  '[]'$ Gastroesophageal reflux/heartburn   '[]'$ Difficulty swallowing. Genitourinary:  '[]'$ Chronic kidney disease   '[]'$ Difficult urination  '[]'$ Frequent urination   '[]'$ Blood in urine Skin:  '[]'$ Rashes   '[]'$ Ulcers  Psychological:  '[]'$ History of anxiety   '[]'$  History of major depression.  Physical Examination  There were no vitals filed for this visit. There is no height or weight on file to calculate BMI. Gen: WD/WN, NAD Head: [place]/AT, No temporalis wasting.  Ear/Nose/Throat: Hearing grossly intact, nares w/o erythema or drainage, pinna without lesions Eyes: PER, EOMI, sclera nonicteric.  Neck: Supple, no gross  masses.  No JVD.  Pulmonary:  Good air movement, no audible wheezing, no use of accessory muscles.  Cardiac: RRR, precordium not hyperdynamic. Vascular:  scattered varicosities present bilaterally.  Moderate venous stasis changes to the legs bilaterally.  2+ soft pitting edema. CEAP [id number]   Vessel Right Left  Radial Palpable Palpable  Gastrointestinal: soft, non-distended. No guarding/no peritoneal signs.  Musculoskeletal: M/S 5/5 throughout.  No deformity.  Neurologic: CN 2-12 intact. Pain and light touch intact in extremities.  Symmetrical.  Speech is fluent. Motor exam as listed above. Psychiatric: Judgment intact, Mood & affect appropriate for pt's clinical situation. Dermatologic: Venous rashes no ulcers noted.  No changes consistent with cellulitis. Lymph : No lichenification or skin changes of chronic lymphedema.  CBC Lab Results  Component Value Date   WBC 8.2 12/03/2021   HGB 11.8 (L) 12/03/2021   HCT 35.6 (L) 12/03/2021   MCV 90.4 12/03/2021   PLT 185 12/03/2021    BMET    Component Value Date/Time   NA 140 12/03/2021 0932   K 4.7 12/03/2021 0932   CL 112 (H) 12/03/2021 0932   CO2 23 12/03/2021 0932   GLUCOSE 128 (H) 12/03/2021 0932   BUN 18 12/03/2021 0932   CREATININE 1.07 12/03/2021 0932   CALCIUM 8.4 (L) 12/03/2021 0932   GFRNONAA >60 12/03/2021 0932   CrCl cannot be calculated (Patient's most recent lab result is older than the maximum 21 days allowed.).  COAG Lab Results  Component Value Date   INR 1.4 (H) 11/29/2021   INR 1.2 03/03/2020    Radiology No results found.   Assessment/Plan There are no diagnoses linked to this encounter.   Hortencia Pilar, MD  05/29/2022 11:09 AM

## 2022-05-30 ENCOUNTER — Other Ambulatory Visit (INDEPENDENT_AMBULATORY_CARE_PROVIDER_SITE_OTHER): Payer: Self-pay | Admitting: Nurse Practitioner

## 2022-05-30 DIAGNOSIS — I824Y2 Acute embolism and thrombosis of unspecified deep veins of left proximal lower extremity: Secondary | ICD-10-CM

## 2022-05-31 ENCOUNTER — Ambulatory Visit (INDEPENDENT_AMBULATORY_CARE_PROVIDER_SITE_OTHER): Payer: Managed Care, Other (non HMO)

## 2022-05-31 ENCOUNTER — Encounter (INDEPENDENT_AMBULATORY_CARE_PROVIDER_SITE_OTHER): Payer: Self-pay | Admitting: Vascular Surgery

## 2022-05-31 ENCOUNTER — Ambulatory Visit (INDEPENDENT_AMBULATORY_CARE_PROVIDER_SITE_OTHER): Payer: Managed Care, Other (non HMO) | Admitting: Vascular Surgery

## 2022-05-31 VITALS — BP 144/87 | HR 69 | Resp 18 | Ht 74.0 in | Wt 223.6 lb

## 2022-05-31 DIAGNOSIS — I4891 Unspecified atrial fibrillation: Secondary | ICD-10-CM

## 2022-05-31 DIAGNOSIS — I2692 Saddle embolus of pulmonary artery without acute cor pulmonale: Secondary | ICD-10-CM | POA: Diagnosis not present

## 2022-05-31 DIAGNOSIS — I824Y2 Acute embolism and thrombosis of unspecified deep veins of left proximal lower extremity: Secondary | ICD-10-CM | POA: Diagnosis not present

## 2022-05-31 DIAGNOSIS — E785 Hyperlipidemia, unspecified: Secondary | ICD-10-CM

## 2022-05-31 DIAGNOSIS — I10 Essential (primary) hypertension: Secondary | ICD-10-CM

## 2022-08-13 ENCOUNTER — Telehealth (INDEPENDENT_AMBULATORY_CARE_PROVIDER_SITE_OTHER): Payer: Self-pay | Admitting: Vascular Surgery

## 2022-08-13 ENCOUNTER — Other Ambulatory Visit (INDEPENDENT_AMBULATORY_CARE_PROVIDER_SITE_OTHER): Payer: Self-pay

## 2022-08-13 DIAGNOSIS — I824Y2 Acute embolism and thrombosis of unspecified deep veins of left proximal lower extremity: Secondary | ICD-10-CM

## 2022-08-13 DIAGNOSIS — I2692 Saddle embolus of pulmonary artery without acute cor pulmonale: Secondary | ICD-10-CM

## 2022-08-13 MED ORDER — APIXABAN 5 MG PO TABS: 5.0000 mg | ORAL_TABLET | Freq: Two times a day (BID) | ORAL | 0 refills | Status: DC

## 2022-08-13 NOTE — Telephone Encounter (Signed)
Prescription sent to pharmacy and patient has been notified. 

## 2022-08-13 NOTE — Telephone Encounter (Signed)
Patient called in stating that he needed a refill on his medication. Stated that his blood doctor started given him the medication but was told by them that we needed to start filling it    Please call and advise   [id number] - [place] (N), [place] - [phone]    ELIQUIS 5 MG TABS tablet

## 2022-11-15 ENCOUNTER — Other Ambulatory Visit (INDEPENDENT_AMBULATORY_CARE_PROVIDER_SITE_OTHER): Payer: Self-pay | Admitting: Vascular Surgery

## 2022-11-15 DIAGNOSIS — I2692 Saddle embolus of pulmonary artery without acute cor pulmonale: Secondary | ICD-10-CM

## 2022-11-15 DIAGNOSIS — I824Y2 Acute embolism and thrombosis of unspecified deep veins of left proximal lower extremity: Secondary | ICD-10-CM

## 2023-02-16 ENCOUNTER — Other Ambulatory Visit (INDEPENDENT_AMBULATORY_CARE_PROVIDER_SITE_OTHER): Payer: Self-pay | Admitting: Vascular Surgery

## 2023-02-16 DIAGNOSIS — I824Y2 Acute embolism and thrombosis of unspecified deep veins of left proximal lower extremity: Secondary | ICD-10-CM

## 2023-02-16 DIAGNOSIS — I2692 Saddle embolus of pulmonary artery without acute cor pulmonale: Secondary | ICD-10-CM

## 2023-04-26 ENCOUNTER — Inpatient Hospital Stay (HOSPITAL_COMMUNITY): Payer: Managed Care, Other (non HMO)

## 2023-04-26 ENCOUNTER — Emergency Department (HOSPITAL_COMMUNITY): Payer: Managed Care, Other (non HMO)

## 2023-04-26 ENCOUNTER — Other Ambulatory Visit: Payer: Self-pay

## 2023-04-26 ENCOUNTER — Inpatient Hospital Stay (HOSPITAL_COMMUNITY)
Admission: EM | Admit: 2023-04-26 | Discharge: 2023-05-25 | DRG: 957 | Disposition: E | Payer: Managed Care, Other (non HMO) | Attending: Surgery | Admitting: Surgery

## 2023-04-26 ENCOUNTER — Encounter (HOSPITAL_COMMUNITY): Admission: EM | Disposition: E | Payer: Self-pay | Source: Home / Self Care

## 2023-04-26 DIAGNOSIS — R40243 Glasgow coma scale score 3-8, unspecified time: Secondary | ICD-10-CM | POA: Diagnosis present

## 2023-04-26 DIAGNOSIS — T797XXA Traumatic subcutaneous emphysema, initial encounter: Secondary | ICD-10-CM | POA: Diagnosis present

## 2023-04-26 DIAGNOSIS — D62 Acute posthemorrhagic anemia: Secondary | ICD-10-CM | POA: Diagnosis not present

## 2023-04-26 DIAGNOSIS — T84328A Displacement of other bone devices, implants and grafts, initial encounter: Secondary | ICD-10-CM | POA: Diagnosis not present

## 2023-04-26 DIAGNOSIS — S91311A Laceration without foreign body, right foot, initial encounter: Secondary | ICD-10-CM | POA: Diagnosis present

## 2023-04-26 DIAGNOSIS — I10 Essential (primary) hypertension: Secondary | ICD-10-CM | POA: Diagnosis present

## 2023-04-26 DIAGNOSIS — S270XXA Traumatic pneumothorax, initial encounter: Secondary | ICD-10-CM | POA: Diagnosis present

## 2023-04-26 DIAGNOSIS — S82301B Unspecified fracture of lower end of right tibia, initial encounter for open fracture type I or II: Secondary | ICD-10-CM | POA: Diagnosis present

## 2023-04-26 DIAGNOSIS — S82401B Unspecified fracture of shaft of right fibula, initial encounter for open fracture type I or II: Secondary | ICD-10-CM | POA: Diagnosis present

## 2023-04-26 DIAGNOSIS — L89152 Pressure ulcer of sacral region, stage 2: Secondary | ICD-10-CM | POA: Diagnosis not present

## 2023-04-26 DIAGNOSIS — S02841A Fracture of lateral orbital wall, right side, initial encounter for closed fracture: Secondary | ICD-10-CM | POA: Diagnosis present

## 2023-04-26 DIAGNOSIS — Y92410 Unspecified street and highway as the place of occurrence of the external cause: Secondary | ICD-10-CM | POA: Diagnosis not present

## 2023-04-26 DIAGNOSIS — R000 Tachycardia, unspecified: Secondary | ICD-10-CM | POA: Diagnosis not present

## 2023-04-26 DIAGNOSIS — J9601 Acute respiratory failure with hypoxia: Secondary | ICD-10-CM | POA: Diagnosis present

## 2023-04-26 DIAGNOSIS — S27331A Laceration of lung, unilateral, initial encounter: Secondary | ICD-10-CM | POA: Diagnosis present

## 2023-04-26 DIAGNOSIS — N179 Acute kidney failure, unspecified: Secondary | ICD-10-CM

## 2023-04-26 DIAGNOSIS — S41111A Laceration without foreign body of right upper arm, initial encounter: Secondary | ICD-10-CM | POA: Diagnosis present

## 2023-04-26 DIAGNOSIS — R569 Unspecified convulsions: Secondary | ICD-10-CM | POA: Diagnosis not present

## 2023-04-26 DIAGNOSIS — Z515 Encounter for palliative care: Secondary | ICD-10-CM | POA: Diagnosis not present

## 2023-04-26 DIAGNOSIS — E872 Acidosis, unspecified: Secondary | ICD-10-CM | POA: Diagnosis not present

## 2023-04-26 DIAGNOSIS — S2231XA Fracture of one rib, right side, initial encounter for closed fracture: Secondary | ICD-10-CM | POA: Diagnosis present

## 2023-04-26 DIAGNOSIS — S022XXA Fracture of nasal bones, initial encounter for closed fracture: Secondary | ICD-10-CM | POA: Diagnosis present

## 2023-04-26 DIAGNOSIS — T07XXXA Unspecified multiple injuries, initial encounter: Principal | ICD-10-CM | POA: Diagnosis present

## 2023-04-26 DIAGNOSIS — S069XAA Unspecified intracranial injury with loss of consciousness status unknown, initial encounter: Secondary | ICD-10-CM | POA: Diagnosis present

## 2023-04-26 DIAGNOSIS — S0219XA Other fracture of base of skull, initial encounter for closed fracture: Secondary | ICD-10-CM | POA: Diagnosis present

## 2023-04-26 DIAGNOSIS — Y838 Other surgical procedures as the cause of abnormal reaction of the patient, or of later complication, without mention of misadventure at the time of the procedure: Secondary | ICD-10-CM | POA: Diagnosis not present

## 2023-04-26 DIAGNOSIS — S066XAA Traumatic subarachnoid hemorrhage with loss of consciousness status unknown, initial encounter: Secondary | ICD-10-CM | POA: Diagnosis present

## 2023-04-26 DIAGNOSIS — Z23 Encounter for immunization: Secondary | ICD-10-CM | POA: Diagnosis not present

## 2023-04-26 DIAGNOSIS — R7989 Other specified abnormal findings of blood chemistry: Secondary | ICD-10-CM | POA: Diagnosis not present

## 2023-04-26 DIAGNOSIS — S82301C Unspecified fracture of lower end of right tibia, initial encounter for open fracture type IIIA, IIIB, or IIIC: Secondary | ICD-10-CM

## 2023-04-26 DIAGNOSIS — R579 Shock, unspecified: Secondary | ICD-10-CM | POA: Diagnosis not present

## 2023-04-26 DIAGNOSIS — S40922A Unspecified superficial injury of left upper arm, initial encounter: Secondary | ICD-10-CM

## 2023-04-26 DIAGNOSIS — S41101A Unspecified open wound of right upper arm, initial encounter: Secondary | ICD-10-CM | POA: Diagnosis present

## 2023-04-26 DIAGNOSIS — G935 Compression of brain: Secondary | ICD-10-CM | POA: Diagnosis present

## 2023-04-26 DIAGNOSIS — Z7982 Long term (current) use of aspirin: Secondary | ICD-10-CM

## 2023-04-26 DIAGNOSIS — I609 Nontraumatic subarachnoid hemorrhage, unspecified: Secondary | ICD-10-CM | POA: Diagnosis present

## 2023-04-26 DIAGNOSIS — Z86711 Personal history of pulmonary embolism: Secondary | ICD-10-CM

## 2023-04-26 DIAGNOSIS — S0121XA Laceration without foreign body of nose, initial encounter: Secondary | ICD-10-CM | POA: Diagnosis present

## 2023-04-26 DIAGNOSIS — S82201B Unspecified fracture of shaft of right tibia, initial encounter for open fracture type I or II: Secondary | ICD-10-CM | POA: Diagnosis not present

## 2023-04-26 DIAGNOSIS — S61412A Laceration without foreign body of left hand, initial encounter: Secondary | ICD-10-CM | POA: Diagnosis present

## 2023-04-26 DIAGNOSIS — I469 Cardiac arrest, cause unspecified: Secondary | ICD-10-CM | POA: Diagnosis not present

## 2023-04-26 DIAGNOSIS — Z7901 Long term (current) use of anticoagulants: Secondary | ICD-10-CM

## 2023-04-26 DIAGNOSIS — S82401C Unspecified fracture of shaft of right fibula, initial encounter for open fracture type IIIA, IIIB, or IIIC: Secondary | ICD-10-CM | POA: Diagnosis not present

## 2023-04-26 DIAGNOSIS — S51012A Laceration without foreign body of left elbow, initial encounter: Secondary | ICD-10-CM | POA: Diagnosis present

## 2023-04-26 DIAGNOSIS — S0240EA Zygomatic fracture, right side, initial encounter for closed fracture: Secondary | ICD-10-CM | POA: Diagnosis present

## 2023-04-26 DIAGNOSIS — S065XAA Traumatic subdural hemorrhage with loss of consciousness status unknown, initial encounter: Secondary | ICD-10-CM | POA: Diagnosis present

## 2023-04-26 DIAGNOSIS — N170 Acute kidney failure with tubular necrosis: Secondary | ICD-10-CM | POA: Diagnosis not present

## 2023-04-26 DIAGNOSIS — S51011A Laceration without foreign body of right elbow, initial encounter: Secondary | ICD-10-CM | POA: Diagnosis present

## 2023-04-26 DIAGNOSIS — S41102A Unspecified open wound of left upper arm, initial encounter: Secondary | ICD-10-CM | POA: Diagnosis not present

## 2023-04-26 DIAGNOSIS — S4982XA Other specified injuries of left shoulder and upper arm, initial encounter: Secondary | ICD-10-CM | POA: Diagnosis not present

## 2023-04-26 DIAGNOSIS — S82201C Unspecified fracture of shaft of right tibia, initial encounter for open fracture type IIIA, IIIB, or IIIC: Secondary | ICD-10-CM | POA: Diagnosis not present

## 2023-04-26 DIAGNOSIS — S51812A Laceration without foreign body of left forearm, initial encounter: Secondary | ICD-10-CM | POA: Diagnosis present

## 2023-04-26 DIAGNOSIS — E8770 Fluid overload, unspecified: Secondary | ICD-10-CM | POA: Diagnosis not present

## 2023-04-26 DIAGNOSIS — R9431 Abnormal electrocardiogram [ECG] [EKG]: Secondary | ICD-10-CM | POA: Diagnosis not present

## 2023-04-26 DIAGNOSIS — S82291C Other fracture of shaft of right tibia, initial encounter for open fracture type IIIA, IIIB, or IIIC: Secondary | ICD-10-CM | POA: Diagnosis not present

## 2023-04-26 DIAGNOSIS — I480 Paroxysmal atrial fibrillation: Secondary | ICD-10-CM | POA: Diagnosis not present

## 2023-04-26 DIAGNOSIS — Z79899 Other long term (current) drug therapy: Secondary | ICD-10-CM

## 2023-04-26 DIAGNOSIS — R34 Anuria and oliguria: Secondary | ICD-10-CM | POA: Diagnosis not present

## 2023-04-26 DIAGNOSIS — Z86718 Personal history of other venous thrombosis and embolism: Secondary | ICD-10-CM

## 2023-04-26 HISTORY — PX: WOUND EXPLORATION: SHX6188

## 2023-04-26 HISTORY — PX: MINOR APPLICATION OF WOUND VAC: SHX6243

## 2023-04-26 HISTORY — PX: APPLICATION OF WOUND VAC: SHX5189

## 2023-04-26 HISTORY — PX: EXTERNAL FIXATION LEG: SHX1549

## 2023-04-26 LAB — TYPE AND SCREEN
ABO/RH(D): A NEG
Antibody Screen: NEGATIVE
Unit division: 0
Unit division: 0
Unit division: 0
Unit division: 0

## 2023-04-26 LAB — BPAM RBC
Blood Product Expiration Date: 202503042359
Blood Product Expiration Date: 202503042359
Blood Product Expiration Date: 202503042359
Blood Product Expiration Date: 202503042359
ISSUE DATE / TIME: 202501311418
ISSUE DATE / TIME: 202501311418
ISSUE DATE / TIME: 202501311418
ISSUE DATE / TIME: 202501311418
Unit Type and Rh: 5100
Unit Type and Rh: 5100
Unit Type and Rh: 5100
Unit Type and Rh: 5100

## 2023-04-26 LAB — COMPREHENSIVE METABOLIC PANEL
ALT: 14 U/L (ref 0–44)
AST: 24 U/L (ref 15–41)
Albumin: 2 g/dL — ABNORMAL LOW (ref 3.5–5.0)
Alkaline Phosphatase: 46 U/L (ref 38–126)
Anion gap: 9 (ref 5–15)
BUN: 16 mg/dL (ref 8–23)
CO2: 22 mmol/L (ref 22–32)
Calcium: 7.5 mg/dL — ABNORMAL LOW (ref 8.9–10.3)
Chloride: 108 mmol/L (ref 98–111)
Creatinine, Ser: 1.09 mg/dL (ref 0.61–1.24)
GFR, Estimated: >60 mL/min (ref >60)
Glucose, Bld: 173 mg/dL — ABNORMAL HIGH (ref 70–99)
Potassium: 3.2 mmol/L — ABNORMAL LOW (ref 3.5–5.1)
Sodium: 139 mmol/L (ref 135–145)
Total Bilirubin: 1.1 mg/dL (ref 0.0–1.2)
Total Protein: 3.5 g/dL — ABNORMAL LOW (ref 6.5–8.1)

## 2023-04-26 LAB — TRAUMA TEG PANEL
CFF Max Amplitude: 13.4 mm — ABNORMAL LOW (ref 15–32)
Citrated Kaolin (R): 5.2 min (ref 4.6–9.1)
Citrated Rapid TEG (MA): 42.6 mm — ABNORMAL LOW (ref 52–70)
Lysis at 30 Minutes: 0 % (ref 0.0–2.6)

## 2023-04-26 LAB — URINALYSIS, ROUTINE W REFLEX MICROSCOPIC
Bacteria, UA: NONE SEEN
Bilirubin Urine: NEGATIVE
Glucose, UA: 50 mg/dL — AB
Ketones, ur: NEGATIVE mg/dL
Leukocytes,Ua: NEGATIVE
Nitrite: NEGATIVE
Protein, ur: 30 mg/dL — AB
Specific Gravity, Urine: 1.016 (ref 1.005–1.030)
pH: 6 (ref 5.0–8.0)

## 2023-04-26 LAB — MAGNESIUM: Magnesium: 1.4 mg/dL — ABNORMAL LOW (ref 1.7–2.4)

## 2023-04-26 LAB — CBC
HCT: 35.8 % — ABNORMAL LOW (ref 39.0–52.0)
Hemoglobin: 12.2 g/dL — ABNORMAL LOW (ref 13.0–17.0)
MCH: 29.6 pg (ref 26.0–34.0)
MCHC: 34.1 g/dL (ref 30.0–36.0)
MCV: 86.9 fL (ref 80.0–100.0)
Platelets: 78 10*3/uL — ABNORMAL LOW (ref 150–400)
RBC: 4.12 MIL/uL — ABNORMAL LOW (ref 4.22–5.81)
RDW: 15 % (ref 11.5–15.5)
WBC: 9.7 10*3/uL (ref 4.0–10.5)
nRBC: 0 % (ref 0.0–0.2)

## 2023-04-26 LAB — HIV ANTIBODY (ROUTINE TESTING W REFLEX): HIV Screen 4th Generation wRfx: NONREACTIVE

## 2023-04-26 LAB — MASSIVE TRANSFUSION PROTOCOL ORDER (BLOOD BANK NOTIFICATION)

## 2023-04-26 LAB — POCT I-STAT 7, (LYTES, BLD GAS, ICA,H+H)
Acid-base deficit: 4 mmol/L — ABNORMAL HIGH (ref 0.0–2.0)
Bicarbonate: 22.6 mmol/L (ref 20.0–28.0)
Calcium, Ion: 1.13 mmol/L — ABNORMAL LOW (ref 1.15–1.40)
HCT: 32 % — ABNORMAL LOW (ref 39.0–52.0)
Hemoglobin: 10.9 g/dL — ABNORMAL LOW (ref 13.0–17.0)
O2 Saturation: 100 %
Potassium: 3.1 mmol/L — ABNORMAL LOW (ref 3.5–5.1)
Sodium: 142 mmol/L (ref 135–145)
TCO2: 24 mmol/L (ref 22–32)
pCO2 arterial: 49.2 mmHg — ABNORMAL HIGH (ref 32–48)
pH, Arterial: 7.271 — ABNORMAL LOW (ref 7.35–7.45)
pO2, Arterial: 279 mmHg — ABNORMAL HIGH (ref 83–108)

## 2023-04-26 LAB — PHOSPHORUS: Phosphorus: 4.9 mg/dL — ABNORMAL HIGH (ref 2.5–4.6)

## 2023-04-26 LAB — SAMPLE TO BLOOD BANK

## 2023-04-26 LAB — ABO/RH: ABO/RH(D): A NEG

## 2023-04-26 SURGERY — WOUND EXPLORATION
Anesthesia: General | Site: Leg Lower | Laterality: Right

## 2023-04-26 MED ORDER — FENTANYL CITRATE PF 50 MCG/ML IJ SOSY
25.0000 ug | PREFILLED_SYRINGE | Freq: Once | INTRAMUSCULAR | Status: AC
Start: 2023-04-26 — End: 2023-04-26

## 2023-04-26 MED ORDER — TRANEXAMIC ACID FOR EPISTAXIS
500.0000 mg | Freq: Once | TOPICAL | Status: DC
Start: 2023-04-26 — End: 2023-04-26
  Filled 2023-04-26: qty 10

## 2023-04-26 MED ORDER — ACETAMINOPHEN 500 MG PO TABS
1000.0000 mg | ORAL_TABLET | Freq: Four times a day (QID) | ORAL | Status: DC
Start: 2023-04-26 — End: 2023-04-26
  Administered 2023-04-26: 1000 mg via ORAL
  Filled 2023-04-26 (×2): qty 2

## 2023-04-26 MED ORDER — FENTANYL CITRATE PF 50 MCG/ML IJ SOSY: 25.0000 ug | PREFILLED_SYRINGE | INTRAMUSCULAR | Status: DC | PRN

## 2023-04-26 MED ORDER — PHENYLEPHRINE 80 MCG/ML (10ML) SYRINGE FOR IV PUSH (FOR BLOOD PRESSURE SUPPORT)
PREFILLED_SYRINGE | INTRAVENOUS | Status: DC | PRN
  Administered 2023-04-26: 500 ug via INTRAVENOUS

## 2023-04-26 MED ORDER — ALBUMIN HUMAN 5 % IV SOLN
12.5000 g | Freq: Once | INTRAVENOUS | Status: AC
  Administered 2023-04-26: 12.5 g via INTRAVENOUS
  Filled 2023-04-26: qty 250

## 2023-04-26 MED ORDER — SODIUM CHLORIDE 0.9 % IV SOLN
2.0000 g | INTRAVENOUS | Status: AC
  Administered 2023-04-27 – 2023-04-29 (×3): 2 g via INTRAVENOUS
  Filled 2023-04-26 (×3): qty 20

## 2023-04-26 MED ORDER — LEVETIRACETAM IN NACL 500 MG/100ML IV SOLN
500.0000 mg | Freq: Two times a day (BID) | INTRAVENOUS | Status: DC
  Administered 2023-04-26 – 2023-04-28 (×4): 500 mg via INTRAVENOUS
  Filled 2023-04-26 (×4): qty 100

## 2023-04-26 MED ORDER — TRANEXAMIC ACID 1000 MG/10ML IV SOLN
INTRAVENOUS | Status: DC | PRN
  Administered 2023-04-26: 1000 mg via INTRAVENOUS

## 2023-04-26 MED ORDER — ETOMIDATE 2 MG/ML IV SOLN
INTRAVENOUS | Status: DC | PRN
  Administered 2023-04-26: 20 mg via INTRAVENOUS

## 2023-04-26 MED ORDER — TOBRAMYCIN SULFATE 1.2 G IJ SOLR
INTRAMUSCULAR | Status: AC
  Filled 2023-04-26: qty 1.2

## 2023-04-26 MED ORDER — VANCOMYCIN HCL 1000 MG IV SOLR
INTRAVENOUS | Status: DC | PRN
  Administered 2023-04-26: 1000 mg

## 2023-04-26 MED ORDER — SODIUM CHLORIDE 0.9 % IV SOLN: INTRAVENOUS | Status: DC | PRN

## 2023-04-26 MED ORDER — HYDRALAZINE HCL 20 MG/ML IJ SOLN: 10.0000 mg | INTRAMUSCULAR | Status: DC | PRN

## 2023-04-26 MED ORDER — MIDAZOLAM HCL 2 MG/2ML IJ SOLN
2.0000 mg | Freq: Once | INTRAMUSCULAR | Status: AC
  Administered 2023-04-26: 2 mg via INTRAVENOUS
  Filled 2023-04-26: qty 2

## 2023-04-26 MED ORDER — SODIUM CHLORIDE 0.9% IV SOLUTION: Freq: Once | INTRAVENOUS | Status: DC

## 2023-04-26 MED ORDER — MIDAZOLAM HCL 2 MG/2ML IJ SOLN
INTRAMUSCULAR | Status: DC | PRN
  Administered 2023-04-26: 2 mg via INTRAVENOUS

## 2023-04-26 MED ORDER — CEFAZOLIN SODIUM-DEXTROSE 2-4 GM/100ML-% IV SOLN
2.0000 g | Freq: Once | INTRAVENOUS | Status: DC
  Administered 2023-04-26: 2 g via INTRAVENOUS

## 2023-04-26 MED ORDER — PROPOFOL 1000 MG/100ML IV EMUL
5.0000 ug/kg/min | INTRAVENOUS | Status: DC
  Administered 2023-04-26 – 2023-04-27 (×3): 20 ug/kg/min via INTRAVENOUS
  Administered 2023-04-27: 15 ug/kg/min via INTRAVENOUS
  Administered 2023-04-28 (×2): 5 ug/kg/min via INTRAVENOUS
  Filled 2023-04-26 (×5): qty 100

## 2023-04-26 MED ORDER — CALCIUM CHLORIDE 10 % IV SOLN
INTRAVENOUS | Status: DC | PRN
  Administered 2023-04-26: 1 g via INTRAVENOUS
  Administered 2023-04-26 (×2): 500 mg via INTRAVENOUS

## 2023-04-26 MED ORDER — TETANUS-DIPHTH-ACELL PERTUSSIS 5-2.5-18.5 LF-MCG/0.5 IM SUSY
0.5000 mL | PREFILLED_SYRINGE | Freq: Once | INTRAMUSCULAR | Status: AC
  Administered 2023-04-26: 0.5 mL via INTRAMUSCULAR
  Filled 2023-04-26: qty 0.5

## 2023-04-26 MED ORDER — MIDAZOLAM-SODIUM CHLORIDE 100-0.9 MG/100ML-% IV SOLN
0.5000 mg/h | INTRAVENOUS | Status: DC
  Administered 2023-04-26: 2 mg/h via INTRAVENOUS
  Filled 2023-04-26: qty 100

## 2023-04-26 MED ORDER — DOCUSATE SODIUM 50 MG/5ML PO LIQD
100.0000 mg | Freq: Two times a day (BID) | ORAL | Status: DC
  Administered 2023-04-27 – 2023-05-05 (×16): 100 mg
  Filled 2023-04-26 (×16): qty 10

## 2023-04-26 MED ORDER — ACETAMINOPHEN 500 MG PO TABS
1000.0000 mg | ORAL_TABLET | Freq: Four times a day (QID) | ORAL | Status: DC
  Administered 2023-04-26 – 2023-05-06 (×33): 1000 mg
  Filled 2023-04-26 (×33): qty 2

## 2023-04-26 MED ORDER — ORAL CARE MOUTH RINSE: 15.0000 mL | OROMUCOSAL | Status: DC | PRN

## 2023-04-26 MED ORDER — MIDAZOLAM HCL 2 MG/2ML IJ SOLN
2.0000 mg | Freq: Once | INTRAMUSCULAR | Status: AC
  Administered 2023-04-26: 2 mg via INTRAVENOUS

## 2023-04-26 MED ORDER — POLYETHYLENE GLYCOL 3350 17 G PO PACK
17.0000 g | PACK | Freq: Every day | ORAL | Status: DC | PRN
  Administered 2023-04-30 – 2023-05-03 (×3): 17 g
  Filled 2023-04-26 (×2): qty 1

## 2023-04-26 MED ORDER — ONDANSETRON HCL 4 MG/2ML IJ SOLN
4.0000 mg | Freq: Four times a day (QID) | INTRAMUSCULAR | Status: DC | PRN
  Administered 2023-04-27: 4 mg via INTRAVENOUS
  Filled 2023-04-26: qty 2

## 2023-04-26 MED ORDER — NOREPINEPHRINE 4 MG/250ML-% IV SOLN
INTRAVENOUS | Status: DC | PRN
  Administered 2023-04-26: 5 ug/min via INTRAVENOUS

## 2023-04-26 MED ORDER — SODIUM CHLORIDE 0.9 % IV SOLN
INTRAVENOUS | Status: DC | PRN
  Administered 2023-04-26 – 2023-04-27 (×2): 1000 mL via INTRAVENOUS

## 2023-04-26 MED ORDER — OXYCODONE HCL 5 MG PO TABS
5.0000 mg | ORAL_TABLET | ORAL | Status: DC | PRN
  Administered 2023-04-27 – 2023-04-28 (×2): 10 mg
  Administered 2023-04-28 – 2023-04-30 (×3): 5 mg
  Administered 2023-04-30 (×2): 10 mg
  Administered 2023-05-01 – 2023-05-04 (×8): 5 mg
  Filled 2023-04-26: qty 2
  Filled 2023-04-26 (×7): qty 1
  Filled 2023-04-26: qty 2
  Filled 2023-04-26: qty 1
  Filled 2023-04-26: qty 2
  Filled 2023-04-26 (×3): qty 1
  Filled 2023-04-26: qty 2

## 2023-04-26 MED ORDER — FENTANYL CITRATE (PF) 250 MCG/5ML IJ SOLN
INTRAMUSCULAR | Status: AC
  Filled 2023-04-26: qty 5

## 2023-04-26 MED ORDER — ROCURONIUM BROMIDE 10 MG/ML (PF) SYRINGE
PREFILLED_SYRINGE | INTRAVENOUS | Status: DC | PRN
  Administered 2023-04-26 (×2): 100 mg via INTRAVENOUS

## 2023-04-26 MED ORDER — TRANEXAMIC ACID-NACL 1000-0.7 MG/100ML-% IV SOLN
1000.0000 mg | INTRAVENOUS | Status: AC
  Administered 2023-04-26: 1000 mg via INTRAVENOUS

## 2023-04-26 MED ORDER — VANCOMYCIN HCL 1000 MG IV SOLR
INTRAVENOUS | Status: AC
  Filled 2023-04-26: qty 20

## 2023-04-26 MED ORDER — VASOPRESSIN 20 UNIT/ML IV SOLN
INTRAVENOUS | Status: AC
  Filled 2023-04-26: qty 1

## 2023-04-26 MED ORDER — IOHEXOL 350 MG/ML SOLN
175.0000 mL | Freq: Once | INTRAVENOUS | Status: AC | PRN
  Administered 2023-04-26: 175 mL via INTRAVENOUS

## 2023-04-26 MED ORDER — MIDAZOLAM HCL 2 MG/2ML IJ SOLN
INTRAMUSCULAR | Status: AC
  Filled 2023-04-26: qty 2

## 2023-04-26 MED ORDER — ORAL CARE MOUTH RINSE
15.0000 mL | OROMUCOSAL | Status: DC
  Administered 2023-04-26 – 2023-05-06 (×113): 15 mL via OROMUCOSAL

## 2023-04-26 MED ORDER — SODIUM CHLORIDE 0.9 % IV SOLN: 2.0000 g | INTRAVENOUS | Status: DC

## 2023-04-26 MED ORDER — FENTANYL 2500MCG IN NS 250ML (10MCG/ML) PREMIX INFUSION
0.0000 ug/h | INTRAVENOUS | Status: DC
  Administered 2023-04-26 – 2023-04-27 (×2): 100 ug/h via INTRAVENOUS
  Administered 2023-04-29: 25 ug/h via INTRAVENOUS
  Filled 2023-04-26 (×3): qty 250

## 2023-04-26 MED ORDER — MIDAZOLAM HCL 2 MG/2ML IJ SOLN
INTRAMUSCULAR | Status: AC
  Filled 2023-04-26: qty 4

## 2023-04-26 MED ORDER — MIDAZOLAM BOLUS VIA INFUSION: 2.0000 mg | INTRAVENOUS | Status: DC | PRN

## 2023-04-26 MED ORDER — ONDANSETRON 4 MG PO TBDP: 4.0000 mg | ORAL_TABLET | Freq: Four times a day (QID) | ORAL | Status: DC | PRN

## 2023-04-26 MED ORDER — LACTATED RINGERS IV SOLN: INTRAVENOUS | Status: DC | PRN

## 2023-04-26 MED ORDER — HEPARIN 6000 UNIT IRRIGATION SOLUTION
Status: DC | PRN
  Administered 2023-04-26: 1

## 2023-04-26 MED ORDER — DOCUSATE SODIUM 100 MG PO CAPS: 100.0000 mg | ORAL_CAPSULE | Freq: Two times a day (BID) | ORAL | Status: DC

## 2023-04-26 MED ORDER — SODIUM CHLORIDE 0.9 % IV SOLN
INTRAVENOUS | Status: DC | PRN
  Administered 2023-04-26: 1000 mg via INTRAVENOUS

## 2023-04-26 MED ORDER — SODIUM CHLORIDE 0.9 % IR SOLN
Status: DC | PRN
  Administered 2023-04-26 (×3): 3000 mL

## 2023-04-26 MED ORDER — CHLORHEXIDINE GLUCONATE CLOTH 2 % EX PADS
6.0000 | MEDICATED_PAD | Freq: Every day | CUTANEOUS | Status: DC
  Administered 2023-04-26: 6 via TOPICAL

## 2023-04-26 MED ORDER — SODIUM CHLORIDE 0.9% IV SOLUTION: Freq: Once | INTRAVENOUS | Status: AC

## 2023-04-26 MED ORDER — VASOPRESSIN 20 UNIT/ML IV SOLN
INTRAVENOUS | Status: DC | PRN
  Administered 2023-04-26: 3 [IU] via INTRAVENOUS
  Administered 2023-04-26 (×3): 2 [IU] via INTRAVENOUS

## 2023-04-26 MED ORDER — 0.9 % SODIUM CHLORIDE (POUR BTL) OPTIME
TOPICAL | Status: DC | PRN
  Administered 2023-04-26 (×2): 1000 mL

## 2023-04-26 MED ORDER — METOPROLOL TARTRATE 5 MG/5ML IV SOLN: 5.0000 mg | Freq: Four times a day (QID) | INTRAVENOUS | Status: DC | PRN

## 2023-04-26 MED ORDER — NOREPINEPHRINE 4 MG/250ML-% IV SOLN
0.0000 ug/min | INTRAVENOUS | Status: DC
  Administered 2023-04-28 – 2023-05-03 (×5): 2 ug/min via INTRAVENOUS
  Administered 2023-05-04: 3 ug/min via INTRAVENOUS
  Administered 2023-05-05: 2 ug/min via INTRAVENOUS
  Administered 2023-05-05: 10 ug/min via INTRAVENOUS
  Filled 2023-04-26 (×5): qty 250

## 2023-04-26 MED ORDER — SODIUM CHLORIDE 0.9 % IV SOLN
2.0000 g | Freq: Once | INTRAVENOUS | Status: AC
  Administered 2023-04-26: 2 g via INTRAVENOUS
  Filled 2023-04-26: qty 20

## 2023-04-26 MED ORDER — FENTANYL BOLUS VIA INFUSION
25.0000 ug | INTRAVENOUS | Status: DC | PRN
  Administered 2023-04-26: 50 ug via INTRAVENOUS
  Administered 2023-04-26 (×2): 100 ug via INTRAVENOUS
  Administered 2023-04-26: 200 ug via INTRAVENOUS
  Administered 2023-04-27: 25 ug via INTRAVENOUS
  Administered 2023-04-28: 50 ug via INTRAVENOUS
  Administered 2023-04-29 – 2023-04-30 (×4): 25 ug via INTRAVENOUS
  Administered 2023-05-01: 50 ug via INTRAVENOUS
  Administered 2023-05-01 (×2): 25 ug via INTRAVENOUS
  Administered 2023-05-01: 50 ug via INTRAVENOUS

## 2023-04-26 MED ORDER — SODIUM CHLORIDE 0.9 % IV SOLN: INTRAVENOUS | Status: AC

## 2023-04-26 MED ORDER — FENTANYL CITRATE PF 50 MCG/ML IJ SOSY
25.0000 ug | PREFILLED_SYRINGE | INTRAMUSCULAR | Status: DC | PRN
  Administered 2023-05-03 – 2023-05-06 (×4): 50 ug via INTRAVENOUS
  Filled 2023-04-26: qty 2
  Filled 2023-04-26 (×2): qty 1

## 2023-04-26 MED ORDER — TOBRAMYCIN SULFATE 1.2 G IJ SOLR
INTRAMUSCULAR | Status: DC | PRN
  Administered 2023-04-26: 1.2 g

## 2023-04-26 MED ORDER — EMPTY CONTAINERS FLEXIBLE MISC
900.0000 mg | Freq: Once | Status: AC
  Administered 2023-04-26: 900 mg via INTRAVENOUS
  Filled 2023-04-26: qty 90

## 2023-04-26 MED ORDER — PROPOFOL 500 MG/50ML IV EMUL
INTRAVENOUS | Status: DC | PRN
  Administered 2023-04-26: 20 ug/kg/min via INTRAVENOUS

## 2023-04-26 SURGICAL SUPPLY — 65 items
ALCOHOL 70% 16 OZ (MISCELLANEOUS) ×3 IMPLANT
BAG COUNTER SPONGE SURGICOUNT (BAG) ×6 IMPLANT
BAR GLASS FIBER EXFX 11X500 (EXFIX) IMPLANT
BLADE SURG 10 STRL SS (BLADE) ×3 IMPLANT
BLADE SURG 15 STRL LF DISP TIS (BLADE) IMPLANT
BNDG COHESIVE 4X5 TAN STRL (GAUZE/BANDAGES/DRESSINGS) ×3 IMPLANT
BNDG COHESIVE 6X5 TAN ST LF (GAUZE/BANDAGES/DRESSINGS) ×6 IMPLANT
BNDG ELASTIC 4X5.8 VLCR STR LF (GAUZE/BANDAGES/DRESSINGS) ×3 IMPLANT
BNDG ELASTIC 6X5.8 VLCR STR LF (GAUZE/BANDAGES/DRESSINGS) ×3 IMPLANT
BNDG GAUZE DERMACEA FLUFF 4 (GAUZE/BANDAGES/DRESSINGS) ×9 IMPLANT
CANISTER SUCT 3000ML PPV (MISCELLANEOUS) ×3 IMPLANT
CANISTER WOUND CARE 500ML ATS (WOUND CARE) IMPLANT
CLAMP BLUE BAR TO PIN (EXFIX) IMPLANT
CLIP TI MEDIUM 24 (CLIP) ×3 IMPLANT
CLIP TI WIDE RED SMALL 24 (CLIP) ×3 IMPLANT
CLIP TI WIDE RED SMALL 6 (CLIP) ×3 IMPLANT
COVER SURGICAL LIGHT HANDLE (MISCELLANEOUS) ×6 IMPLANT
CUFF TRNQT CYL 24X4X16.5-23 (TOURNIQUET CUFF) IMPLANT
CUFF TRNQT CYL 34X4.125X (TOURNIQUET CUFF) ×6 IMPLANT
DRAPE C-ARM 42X72 X-RAY (DRAPES) IMPLANT
DRAPE C-ARMOR (DRAPES) ×3 IMPLANT
DRAPE DERMATAC (DRAPES) IMPLANT
DRAPE INCISE IOBAN 66X45 STRL (DRAPES) ×12 IMPLANT
DRAPE U-SHAPE 47X51 STRL (DRAPES) ×3 IMPLANT
DRSG MEPITEL 8X12 (GAUZE/BANDAGES/DRESSINGS) IMPLANT
DURAPREP 26ML APPLICATOR (WOUND CARE) ×3 IMPLANT
ELECT CAUTERY BLADE 6.4 (BLADE) ×3 IMPLANT
ELECT REM PT RETURN 9FT ADLT (ELECTROSURGICAL) ×3
ELECTRODE REM PT RTRN 9FT ADLT (ELECTROSURGICAL) ×6 IMPLANT
GAUZE PAD ABD 8X10 STRL (GAUZE/BANDAGES/DRESSINGS) ×3 IMPLANT
GAUZE SPONGE 4X4 12PLY STRL (GAUZE/BANDAGES/DRESSINGS) ×6 IMPLANT
GAUZE XEROFORM 1X8 LF (GAUZE/BANDAGES/DRESSINGS) ×3 IMPLANT
GAUZE XEROFORM 5X9 LF (GAUZE/BANDAGES/DRESSINGS) ×3 IMPLANT
GLOVE BIO SURGEON STRL SZ7.5 (GLOVE) ×6 IMPLANT
GLOVE BIOGEL PI IND STRL 6.5 (GLOVE) IMPLANT
GLOVE BIOGEL PI IND STRL 8 (GLOVE) ×9 IMPLANT
GLOVE SS BIOGEL STRL SZ 6.5 (GLOVE) IMPLANT
GOWN STRL REUS W/ TWL LRG LVL3 (GOWN DISPOSABLE) ×15 IMPLANT
GOWN STRL REUS W/TWL 2XL LVL3 (GOWN DISPOSABLE) ×6 IMPLANT
GOWN STRL REUS W/TWL XL LVL3 (GOWN DISPOSABLE) ×6 IMPLANT
HALF PIN 5.0X160 (EXFIX) IMPLANT
KIT BASIN OR (CUSTOM PROCEDURE TRAY) ×6 IMPLANT
KIT TURNOVER KIT B (KITS) ×6 IMPLANT
MANIFOLD NEPTUNE II (INSTRUMENTS) ×3 IMPLANT
NS IRRIG 1000ML POUR BTL (IV SOLUTION) ×12 IMPLANT
PACK ORTHO EXTREMITY (CUSTOM PROCEDURE TRAY) ×3 IMPLANT
PAD ARMBOARD 7.5X6 YLW CONV (MISCELLANEOUS) ×12 IMPLANT
PADDING CAST ABS COTTON 4X4 ST (CAST SUPPLIES) ×6 IMPLANT
PADDING CAST COTTON 6X4 STRL (CAST SUPPLIES) ×9 IMPLANT
PIN CLAMP 2BAR 75MM BLUE (EXFIX) IMPLANT
PIN TRANSFIXING 5.0 (EXFIX) IMPLANT
SET CYSTO W/LG BORE CLAMP LF (SET/KITS/TRAYS/PACK) ×3 IMPLANT
SET HNDPC FAN SPRY TIP SCT (DISPOSABLE) IMPLANT
SPONGE T-LAP 18X18 ~~LOC~~+RFID (SPONGE) ×6 IMPLANT
STAPLER VISISTAT 35W (STAPLE) IMPLANT
STOCKINETTE IMPERVIOUS 9X36 MD (GAUZE/BANDAGES/DRESSINGS) ×3 IMPLANT
STOCKINETTE IMPERVIOUS LG (DRAPES) ×3 IMPLANT
SUT ETHILON 2 0 FS 18 (SUTURE) IMPLANT
SUT ETHILON 3 0 PS 1 (SUTURE) IMPLANT
TOWEL GREEN STERILE (TOWEL DISPOSABLE) ×9 IMPLANT
TOWEL GREEN STERILE FF (TOWEL DISPOSABLE) ×6 IMPLANT
TUBE CONNECTING 12X1/4 (SUCTIONS) ×3 IMPLANT
UNDERPAD 30X36 HEAVY ABSORB (UNDERPADS AND DIAPERS) ×6 IMPLANT
WATER STERILE IRR 1000ML POUR (IV SOLUTION) ×12 IMPLANT
YANKAUER SUCT BULB TIP NO VENT (SUCTIONS) ×3 IMPLANT
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
## 2023-04-26 NOTE — ED Notes (Signed)
C-ollar replaced with Aspen collar.
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
## 2023-04-26 NOTE — ED Notes (Signed)
Dr. Bedelia Person inserting Rt Chest tube.

## 2023-04-26 NOTE — Progress Notes (Signed)
Pt transported to CT2 and back to 4N24 by RT RN and TRN. No complications

## 2023-04-26 NOTE — Progress Notes (Signed)
R heel drsg saturated through with bloody drainage. Dr. Janee Morn at bedside made aware. Reinforced with kerlix.
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
## 2023-04-26 NOTE — Anesthesia Procedure Notes (Signed)
Arterial Line Insertion Start/End1/31/2025 1:50 PM, 04/26/2023 1:57 PM Performed by: Mariann Barter, MD, Marena Chancy, CRNA, CRNA  Patient location: OR. Emergency situation Patient sedated Right, radial was placed Catheter size: 20 G Hand hygiene performed  and maximum sterile barriers used   Attempts: 2 Procedure performed using ultrasound guided technique. Ultrasound Notes:anatomy identified, needle tip was noted to be adjacent to the nerve/plexus identified and no ultrasound evidence of intravascular and/or intraneural injection Following insertion, dressing applied. Patient tolerated the procedure well with no immediate complications.

## 2023-04-26 NOTE — Procedures (Signed)
   Procedure Note  Date: 04/26/2023  Procedure: central venous catheter placement--left, femoral vein, without ultrasound guidance  Pre-op diagnosis: inadequate IV access, unable to obtain additional peripheral access Post-op diagnosis: same  Surgeon: Diamantina Monks, MD  Anesthesia: local  EBL: <5cc Drains/Implants:  single  lumen central venous catheter  Description of procedure: This procedure was performed emergently and therefore informed consent was not obtained. The left groin was prepped and draped in the usual sterile fashion. The left femoral vein was localized using anatomic landmarks, accessed using an introducer needle, and a guidewire passed through the needle. The needle was removed and a skin nick was made. The tract was dilated and the central venous catheter advanced over the guidewire followed by removal of the guidewire. All ports drew blood easily and all were flushed with saline. The catheter was secured to the skin with suture.   Diamantina Monks, MD General and Trauma Surgery [facility]

## 2023-04-26 NOTE — Consult Note (Signed)
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 posterior lower left renal  cyst, for which no follow-up imaging is recommended. No renal laceration. Normal bladder. Stomach/Bowel: Grossly normal stomach. Normal caliber small bowel with no small bowel wall thickening. Normal appendix. Marked colonic diverticulosis, most prominent in the sigmoid colon, with no large bowel wall thickening or significant pericolonic fat stranding. Vascular/Lymphatic: Atherosclerotic nonaneurysmal abdominal aorta. Patent portal, splenic, hepatic and renal veins. Left common femoral central venous catheter terminates in the left external iliac vein. No pathologically enlarged lymph nodes in the abdomen or pelvis. Reproductive: Top-normal size prostate. Other: No pneumoperitoneum, ascites or focal fluid collection. Musculoskeletal: No aggressive appearing focal osseous lesions. No fracture in the abdomen or pelvis. Mild lumbar spondylosis. IMPRESSION: 1. Persistent moderate anterior right  pneumothorax with well-positioned right pigtail chest tube terminating in the posterior mid to lower right pleural space. 2. Curvilinear air-filled tracts with peripheral mild consolidation in the right upper lobe and right middle lobe suspicious for pulmonary lacerations. 3. Near complete left lower lobe atelectasis. Mild-to-moderate right lung atelectasis, most prominent in the dependent right lower lobe. 4. Minimal scattered foci of pneumomediastinum in the posterior right pericardial and high right internal mammary regions. Subcutaneous emphysema in the ventral right chest wall. 5. Probable nondisplaced posterior right fifth rib fracture. 6. No evidence of acute traumatic injury in the abdomen or pelvis. 7. Cholelithiasis. 8. Nonobstructing right nephrolithiasis. 9. Marked colonic diverticulosis, most prominent in the sigmoid colon. 10.  Aortic Atherosclerosis (ICD10-I70.0). Electronically Signed   By: Delbert Phenix M.D.   On: 04/26/2023 14:22   DG Chest Port 1 View Result Date: 04/26/2023 CLINICAL DATA:  Trauma EXAM: PORTABLE CHEST 1 VIEW COMPARISON:  11/29/2021. FINDINGS: Endotracheal tube is seen with its tip approximately 3.1 cm above the carina. Limited visualization of bilateral lungs however, no frank pneumothorax seen. There is a pleural drainage catheter overlying the right lower lung zone. There are nonspecific opacities overlying the right mid lung zone, which may represent lung contusion, pneumonitis or atelectasis. Stable cardio-mediastinal silhouette. No acute displaced rib fracture seen. The soft tissues are within normal limits. IMPRESSION: *Endotracheal tube is seen with its tip approximately 3.1 cm above the carina. *Right-sided pleural drainage catheter noted. *Nonspecific heterogeneous opacity overlying the right mid lung zone, which may represent lung contusion, pneumonitis or atelectasis. No discrete pneumothorax seen. No displaced rib fracture seen. Please note, patient is scheduled to  undergo CT scan chest abdomen and pelvis. Critical Value/emergent results were called by telephone at the time of interpretation on 04/26/2023 at 1:43 pm to provider Dr. Bedelia Person, who verbally acknowledged these results. Electronically Signed   By: Jules Schick M.D.   On: 04/26/2023 13:43   DG Pelvis Portable Result Date: 04/26/2023 CLINICAL DATA:  Trauma. EXAM: PORTABLE PELVIS 1-2 VIEWS COMPARISON:  None Available. FINDINGS: Please note only the portion of pelvis is included in the film. No acute displaced fracture seen. No radiopaque foreign bodies. IMPRESSION: Limited but grossly unremarkable exam. Electronically Signed   By: Jules Schick M.D.   On: 04/26/2023 13:39    Review of Systems Blood pressure 138/70, pulse 85, resp. rate 18, SpO2 100%. Physical Exam  Assessment/Plan: 73yo M with multiple orthopedic injuries for which he is in the OR currently, significant skull and right temporal bone fractures, non-displaced right zygomatic arch fracture and right lateral orbital wall fractures. Based on CT review, the facial fractures are non-operative. Will see the patient once he is stabilized, but do not anticipate any surgical intervention for facial fractures.   Exie Parody 04/26/2023, 2:35 PM
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
## 2023-04-26 NOTE — ED Notes (Signed)
Neurosurgery notified per TRN.
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
## 2023-04-26 NOTE — ED Notes (Signed)
 Ortho at bedside.

## 2023-04-26 NOTE — ED Notes (Signed)
Pt belongings insisted of: phone, wallet, keys, phone charger & boots brought up to the floor by Anell Barr, RN from ED Tra B.

## 2023-04-26 NOTE — Transfer of Care (Signed)
Immediate Anesthesia Transfer of Care Note  Patient: Paul Mitchell  Procedure(s) Performed: WOUND EXPLORATION LEFT UPPER EXTREMITY (Left: Arm Upper) I&D W/WOUND EXPLORATION LEFT EXTREMITY (Left: Arm Upper) EXTERNAL FIXATION LEG (Right: Leg Lower) APPLICATION OF WOUND VAC (Left: Arm Upper) MINOR APPLICATION OF WOUND VAC (Right: Leg Lower) IRRIGATION AND DEBRIDEMENT W/WOUND EXPLORATION LEFT LOWER EXTREMITY (Left: Leg Lower)  Patient Location: ICU  Anesthesia Type:General  Level of Consciousness: sedated and Patient remains intubated per anesthesia plan  Airway & Oxygen Therapy: Patient remains intubated per anesthesia plan and Patient placed on Ventilator (see vital sign flow sheet for setting)  Post-op Assessment: Report given to RN and Post -op Vital signs reviewed and stable  Post vital signs: Reviewed and stable  Last Vitals: see ICU VS flowsheet Vitals Value Taken Time  BP 107/89 04/26/23 1554  Temp    Pulse    Resp 18 04/26/23 1557  SpO2    Vitals shown include unfiled device data.  Last Pain: There were no vitals filed for this visit.       Complications: No notable events documented.

## 2023-04-26 NOTE — Procedures (Signed)
   Procedure Note  Date: 04/26/2023  Procedure: tube thoracostomy--right    Pre-op diagnosis: right pneumothorax  Post-op diagnosis: same  Surgeon: Diamantina Monks, MD  Anesthesia: local   EBL: <5cc procedural; 5cc blood evacuated Drains/Implants: 45F chest tube Specimen: none  Description of procedure: This procedure was performed emergently and therefore informed consent was not obtained. A small skin nick was made at the fourth intercostal space. An introducer needle was inserted and a guidewire inserted through the needle. The needle was removed and the tract dilated. The chest tube was inserted over the guidewire and the guidewire removed.   The tube was secured at the skin with suture and connected to an atrium at -20cm water wall suction. Immediate output from the chest tube was 5cc and was bloody. The site was dressed with xeroform, gauze, and tape. The patient tolerated the procedure well. There were no complications. Follow up chest x-ray was ordered to confirm tube positioning, complete evacuation, and complete lung re-expansion.    Diamantina Monks, MD General and Trauma Surgery [facility]

## 2023-04-26 NOTE — ED Notes (Signed)
 Pt arrived to CT

## 2023-04-26 NOTE — Progress Notes (Signed)
VAST consult received for PICC placement. PICC RN reviewed chart and spoke with primary RN. ICU RN verbalized, "I don't know where you are going to place a PICC". Due to multiple trauma, recommended central line placement by physician. ICU RN to notify MD.
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
## 2023-04-26 NOTE — Anesthesia Preprocedure Evaluation (Addendum)
Anesthesia Evaluation  Patient identified by MRN, date of birth, ID band Patient unresponsive    Reviewed: Unable to perform ROS - Chart review onlyPreop documentation limited or incomplete due to emergent nature of procedure.  Airway Mallampati: Unable to assess       Dental no notable dental hx.    Pulmonary  S/p R chest tube    + decreased breath sounds      Cardiovascular  Rhythm:Regular Rate:Tachycardia  Reportedly on eliquis, unknown indication. Unknown cardiac history.    Neuro/Psych    GI/Hepatic   Endo/Other    Renal/GU      Musculoskeletal Multiple orthopedic injuries   Abdominal   Peds  Hematology   Anesthesia Other Findings   Reproductive/Obstetrics                              Anesthesia Physical Anesthesia Plan  ASA: 5 and emergent  Anesthesia Plan: General   Post-op Pain Management:    Induction: Intravenous  PONV Risk Score and Plan: 2 and Ondansetron  Airway Management Planned: Oral ETT  Additional Equipment: Arterial line, CVP and Ultrasound Guidance Line Placement  Intra-op Plan:   Post-operative Plan: Post-operative intubation/ventilation  Informed Consent: I have reviewed the patients History and Physical, chart, labs and discussed the procedure including the risks, benefits and alternatives for the proposed anesthesia with the patient or authorized representative who has indicated his/her understanding and acceptance.     Only emergency history available and History available from chart only  Plan Discussed with: CRNA  Anesthesia Plan Comments:          Anesthesia Quick Evaluation

## 2023-04-26 NOTE — ED Notes (Signed)
[place] placing Central line at this time.
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
## 2023-04-26 NOTE — ED Notes (Signed)
 X-ray at bedside
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
## 2023-04-26 NOTE — Anesthesia Postprocedure Evaluation (Signed)
Anesthesia Post Note  Patient: Paul Mitchell  Procedure(s) Performed: WOUND EXPLORATION LEFT UPPER EXTREMITY (Left: Arm Upper) I&D W/WOUND EXPLORATION LEFT EXTREMITY (Left: Arm Upper) EXTERNAL FIXATION LEG (Right: Leg Lower) APPLICATION OF WOUND VAC (Left: Arm Upper) MINOR APPLICATION OF WOUND VAC (Right: Leg Lower) IRRIGATION AND DEBRIDEMENT W/WOUND EXPLORATION LEFT LOWER EXTREMITY (Left: Leg Lower)     Patient location during evaluation: SICU Anesthesia Type: General Level of consciousness: sedated Pain management: pain level controlled Vital Signs Assessment: post-procedure vital signs reviewed and stable Respiratory status: patient remains intubated per anesthesia plan and patient on ventilator - see flowsheet for VS Cardiovascular status: stable Postop Assessment: no apparent nausea or vomiting Anesthetic complications: no   No notable events documented.  Last Vitals:  Vitals:   04/26/23 1343 04/26/23 1344  BP:    Pulse:    Resp: (!) 24 18  SpO2:      Last Pain: There were no vitals filed for this visit.               Mariann Barter

## 2023-04-26 NOTE — ED Notes (Signed)
Pt arrived to OR from ED.
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
## 2023-04-27 ENCOUNTER — Inpatient Hospital Stay (HOSPITAL_COMMUNITY): Payer: Managed Care, Other (non HMO)

## 2023-04-27 LAB — CBC
HCT: 31.8 % — ABNORMAL LOW (ref 39.0–52.0)
HCT: 30 % — ABNORMAL LOW (ref 39.0–52.0)
Hemoglobin: 11.1 g/dL — ABNORMAL LOW (ref 13.0–17.0)
Hemoglobin: 10.2 g/dL — ABNORMAL LOW (ref 13.0–17.0)
MCH: 29.9 pg (ref 26.0–34.0)
MCH: 29.6 pg (ref 26.0–34.0)
MCHC: 34.9 g/dL (ref 30.0–36.0)
MCHC: 34 g/dL (ref 30.0–36.0)
MCV: 85.7 fL (ref 80.0–100.0)
MCV: 87 fL (ref 80.0–100.0)
Platelets: 66 10*3/uL — ABNORMAL LOW (ref 150–400)
Platelets: 61 10*3/uL — ABNORMAL LOW (ref 150–400)
RBC: 3.71 MIL/uL — ABNORMAL LOW (ref 4.22–5.81)
RBC: 3.45 MIL/uL — ABNORMAL LOW (ref 4.22–5.81)
RDW: 15.6 % — ABNORMAL HIGH (ref 11.5–15.5)
RDW: 16.3 % — ABNORMAL HIGH (ref 11.5–15.5)
WBC: 9.3 10*3/uL (ref 4.0–10.5)
WBC: 9.9 10*3/uL (ref 4.0–10.5)
nRBC: 0 % (ref 0.0–0.2)
nRBC: 0 % (ref 0.0–0.2)

## 2023-04-27 LAB — PREPARE PLATELET PHERESIS
Unit division: 0
Unit division: 0
Unit division: 0

## 2023-04-27 LAB — PREPARE FRESH FROZEN PLASMA
Unit division: 0
Unit division: 0
Unit division: 0
Unit division: 0
Unit division: 0
Unit division: 0

## 2023-04-27 LAB — BASIC METABOLIC PANEL
Anion gap: 12 (ref 5–15)
Anion gap: 12 (ref 5–15)
BUN: 25 mg/dL — ABNORMAL HIGH (ref 8–23)
BUN: 31 mg/dL — ABNORMAL HIGH (ref 8–23)
CO2: 19 mmol/L — ABNORMAL LOW (ref 22–32)
CO2: 19 mmol/L — ABNORMAL LOW (ref 22–32)
Calcium: 7 mg/dL — ABNORMAL LOW (ref 8.9–10.3)
Calcium: 7.1 mg/dL — ABNORMAL LOW (ref 8.9–10.3)
Chloride: 109 mmol/L (ref 98–111)
Chloride: 107 mmol/L (ref 98–111)
Creatinine, Ser: 2.55 mg/dL — ABNORMAL HIGH (ref 0.61–1.24)
Creatinine, Ser: 3.71 mg/dL — ABNORMAL HIGH (ref 0.61–1.24)
GFR, Estimated: 26 mL/min — ABNORMAL LOW (ref >60)
GFR, Estimated: 17 mL/min — ABNORMAL LOW (ref >60)
Glucose, Bld: 148 mg/dL — ABNORMAL HIGH (ref 70–99)
Glucose, Bld: 143 mg/dL — ABNORMAL HIGH (ref 70–99)
Potassium: 3.8 mmol/L (ref 3.5–5.1)
Potassium: 4.1 mmol/L (ref 3.5–5.1)
Sodium: 140 mmol/L (ref 135–145)
Sodium: 138 mmol/L (ref 135–145)

## 2023-04-27 LAB — BPAM FFP
Blood Product Expiration Date: 202502012359
Blood Product Expiration Date: 202502032359
Blood Product Expiration Date: 202502032359
Blood Product Expiration Date: 202502052359
Blood Product Expiration Date: 202502052359
Blood Product Expiration Date: 202502052359
Blood Product Expiration Date: 202502052359
Blood Product Expiration Date: 202502052359
Blood Product Expiration Date: 202502052359
Blood Product Expiration Date: 202502052359
Blood Product Expiration Date: 202502052359
Blood Product Expiration Date: 202502052359
Blood Product Expiration Date: 202502052359
Blood Product Expiration Date: 202502052359
Blood Product Expiration Date: 202502052359
Blood Product Expiration Date: 202502052359
Blood Product Expiration Date: 202502052359
Blood Product Expiration Date: 202502052359
Blood Product Expiration Date: 202502052359
Blood Product Expiration Date: 202502052359
ISSUE DATE / TIME: 202501311242
ISSUE DATE / TIME: 202501311242
ISSUE DATE / TIME: 202501311242
ISSUE DATE / TIME: 202501311242
ISSUE DATE / TIME: 202501311242
ISSUE DATE / TIME: 202501311242
ISSUE DATE / TIME: 202501311242
ISSUE DATE / TIME: 202501311242
ISSUE DATE / TIME: 202501311346
ISSUE DATE / TIME: 202501311346
ISSUE DATE / TIME: 202501311346
ISSUE DATE / TIME: 202501311346
ISSUE DATE / TIME: 202501311416
ISSUE DATE / TIME: 202501311416
ISSUE DATE / TIME: 202501311416
ISSUE DATE / TIME: 202501311436
ISSUE DATE / TIME: 202501311436
ISSUE DATE / TIME: 202501311436
ISSUE DATE / TIME: 202501311436
ISSUE DATE / TIME: 202501311627
Unit Type and Rh: 600
Unit Type and Rh: 600
Unit Type and Rh: 600
Unit Type and Rh: 600
Unit Type and Rh: 6200
Unit Type and Rh: 6200
Unit Type and Rh: 6200
Unit Type and Rh: 6200
Unit Type and Rh: 6200
Unit Type and Rh: 6200
Unit Type and Rh: 6200
Unit Type and Rh: 6200
Unit Type and Rh: 6200
Unit Type and Rh: 6200
Unit Type and Rh: 6200
Unit Type and Rh: 6200
Unit Type and Rh: 6200
Unit Type and Rh: 6200
Unit Type and Rh: 6200
Unit Type and Rh: 6200

## 2023-04-27 LAB — PREPARE CRYOPRECIPITATE: Unit division: 0

## 2023-04-27 LAB — BPAM PLATELET PHERESIS
Blood Product Expiration Date: 202502012359
Blood Product Expiration Date: 202502012359
Blood Product Expiration Date: 202502022359
ISSUE DATE / TIME: 202501311251
ISSUE DATE / TIME: 202501311545
ISSUE DATE / TIME: 202501311926
Unit Type and Rh: 5100
Unit Type and Rh: 7300
Unit Type and Rh: 5100

## 2023-04-27 LAB — POCT I-STAT 7, (LYTES, BLD GAS, ICA,H+H)
Acid-base deficit: 4 mmol/L — ABNORMAL HIGH (ref 0.0–2.0)
Bicarbonate: 21.1 mmol/L (ref 20.0–28.0)
Calcium, Ion: 1.03 mmol/L — ABNORMAL LOW (ref 1.15–1.40)
HCT: 30 % — ABNORMAL LOW (ref 39.0–52.0)
Hemoglobin: 10.2 g/dL — ABNORMAL LOW (ref 13.0–17.0)
O2 Saturation: 99 %
Patient temperature: 36.6
Potassium: 4 mmol/L (ref 3.5–5.1)
Sodium: 141 mmol/L (ref 135–145)
TCO2: 22 mmol/L (ref 22–32)
pCO2 arterial: 36.1 mmHg (ref 32–48)
pH, Arterial: 7.372 (ref 7.35–7.45)
pO2, Arterial: 122 mmHg — ABNORMAL HIGH (ref 83–108)

## 2023-04-27 LAB — GLUCOSE, CAPILLARY
Glucose-Capillary: 140 mg/dL — ABNORMAL HIGH (ref 70–99)
Glucose-Capillary: 135 mg/dL — ABNORMAL HIGH (ref 70–99)
Glucose-Capillary: 134 mg/dL — ABNORMAL HIGH (ref 70–99)
Glucose-Capillary: 136 mg/dL — ABNORMAL HIGH (ref 70–99)
Glucose-Capillary: 138 mg/dL — ABNORMAL HIGH (ref 70–99)

## 2023-04-27 LAB — CK: Total CK: 1574 U/L — ABNORMAL HIGH (ref 49–397)

## 2023-04-27 LAB — BPAM CRYOPRECIPITATE
Blood Product Expiration Date: 202502052359
ISSUE DATE / TIME: 202501311405
Unit Type and Rh: 6200

## 2023-04-27 LAB — PHOSPHORUS
Phosphorus: 5.1 mg/dL — ABNORMAL HIGH (ref 2.5–4.6)
Phosphorus: 4.8 mg/dL — ABNORMAL HIGH (ref 2.5–4.6)

## 2023-04-27 LAB — TRIGLYCERIDES: Triglycerides: 69 mg/dL (ref <150)

## 2023-04-27 LAB — MAGNESIUM
Magnesium: 1.6 mg/dL — ABNORMAL LOW (ref 1.7–2.4)
Magnesium: 2 mg/dL (ref 1.7–2.4)

## 2023-04-27 LAB — HEMOGLOBIN A1C
Hgb A1c MFr Bld: 5.4 % (ref 4.8–5.6)
Mean Plasma Glucose: 108.28 mg/dL

## 2023-04-27 MED ORDER — LACTATED RINGERS IV BOLUS
1000.0000 mL | Freq: Once | INTRAVENOUS | Status: AC
  Administered 2023-04-27: 1000 mL via INTRAVENOUS

## 2023-04-27 MED ORDER — MAGNESIUM SULFATE 2 GM/50ML IV SOLN
2.0000 g | Freq: Once | INTRAVENOUS | Status: AC
  Administered 2023-04-27: 2 g via INTRAVENOUS
  Filled 2023-04-27: qty 50

## 2023-04-27 MED ORDER — ALBUMIN HUMAN 5 % IV SOLN
12.5000 g | Freq: Once | INTRAVENOUS | Status: AC
  Administered 2023-04-27: 12.5 g via INTRAVENOUS

## 2023-04-27 MED ORDER — ALBUMIN HUMAN 5 % IV SOLN
25.0000 g | Freq: Once | INTRAVENOUS | Status: AC
  Administered 2023-04-27: 25 g via INTRAVENOUS
  Filled 2023-04-27: qty 500

## 2023-04-27 MED ORDER — ADULT MULTIVITAMIN W/MINERALS CH
1.0000 | ORAL_TABLET | Freq: Every day | ORAL | Status: DC
  Administered 2023-04-27 – 2023-05-06 (×8): 1
  Filled 2023-04-27 (×8): qty 1

## 2023-04-27 MED ORDER — SODIUM CHLORIDE 0.9 % IV SOLN: INTRAVENOUS | Status: AC

## 2023-04-27 MED ORDER — LACTATED RINGERS IV BOLUS
500.0000 mL | Freq: Once | INTRAVENOUS | Status: AC
  Administered 2023-04-27: 500 mL via INTRAVENOUS

## 2023-04-27 MED ORDER — PROSOURCE TF20 ENFIT COMPATIBL EN LIQD
60.0000 mL | Freq: Every day | ENTERAL | Status: DC
  Administered 2023-04-27 – 2023-04-28 (×2): 60 mL
  Filled 2023-04-27 (×2): qty 60

## 2023-04-27 MED ORDER — CHLORHEXIDINE GLUCONATE CLOTH 2 % EX PADS
6.0000 | MEDICATED_PAD | Freq: Every day | CUTANEOUS | Status: DC
  Administered 2023-04-28 – 2023-05-05 (×10): 6 via TOPICAL

## 2023-04-27 MED ORDER — PIVOT 1.5 CAL PO LIQD
1000.0000 mL | ORAL | Status: DC
  Administered 2023-04-28: 1000 mL

## 2023-04-27 MED ORDER — FUROSEMIDE 10 MG/ML IJ SOLN
20.0000 mg | Freq: Once | INTRAMUSCULAR | Status: AC
  Administered 2023-04-27: 20 mg via INTRAVENOUS
  Filled 2023-04-27: qty 2

## 2023-04-27 MED ORDER — FUROSEMIDE 10 MG/ML IJ SOLN
80.0000 mg | Freq: Once | INTRAMUSCULAR | Status: AC
  Administered 2023-04-27: 80 mg via INTRAVENOUS
  Filled 2023-04-27: qty 8

## 2023-04-27 MED ORDER — INSULIN ASPART 100 UNIT/ML IJ SOLN
0.0000 [IU] | INTRAMUSCULAR | Status: DC
  Administered 2023-04-27 – 2023-05-02 (×18): 2 [IU] via SUBCUTANEOUS
  Administered 2023-05-03 (×2): 3 [IU] via SUBCUTANEOUS
  Administered 2023-05-03 (×2): 2 [IU] via SUBCUTANEOUS
  Administered 2023-05-03: 3 [IU] via SUBCUTANEOUS
  Administered 2023-05-03 – 2023-05-04 (×3): 2 [IU] via SUBCUTANEOUS
  Administered 2023-05-04: 3 [IU] via SUBCUTANEOUS
  Administered 2023-05-04: 2 [IU] via SUBCUTANEOUS
  Administered 2023-05-04: 3 [IU] via SUBCUTANEOUS
  Administered 2023-05-05 (×3): 2 [IU] via SUBCUTANEOUS
  Administered 2023-05-05: 3 [IU] via SUBCUTANEOUS
  Administered 2023-05-05 – 2023-05-06 (×4): 2 [IU] via SUBCUTANEOUS
  Administered 2023-05-06: 11 [IU] via SUBCUTANEOUS

## 2023-04-27 MED ORDER — LACTATED RINGERS IV BOLUS: 500.0000 mL | Freq: Once | INTRAVENOUS | Status: DC
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
## 2023-04-27 NOTE — Progress Notes (Signed)
..  Trauma Event Note   1930-Notified by primary RN SBP 80's, TMD notified, Levo reordered PRN, Plts and Albumin infusing, will continue to monitor. 2230-assisted with pt transport to/from CT with primary RN and RT, no adverse events.  0100-Notified by primary RN while doing mouth care tooth became dislodged and was subsequently collected by RN. TMD contacted, photo in chart, unfortunately unable to be salvaged.  0130-notified by primary RN emesis x 1 and 60ml UOP since 1900, TMD notified, additional Albumin ordered. 0400-per primary RN 1st bottle Albumin completed, bladder scan 0, foley irrigated, still no UOP. TMD updated, will reassess after 2nd bottle Albumin.   Last imported Vital Signs BP 104/69 (BP Location: Right Arm)   Pulse 84   Temp (!) 97.3 F (36.3 C) (Esophageal)   Resp 20   Ht 6' (1.829 m)   Wt 240 lb (108.9 kg)   SpO2 100%   BMI 32.55 kg/m   Trending CBC Recent Labs    04/26/23 1625 04/26/23 1725 04/27/23 0433  WBC 9.7  --  9.3  HGB 12.2* 10.9* 11.1*  HCT 35.8* 32.0* 31.8*  PLT 78*  --  66*    Trending Coag's No results for input(s): "APTT", "INR" in the last 72 hours.  Trending BMET Recent Labs    04/26/23 1625 04/26/23 1725 04/27/23 0433  NA 139 142 140  K 3.2* 3.1* 3.8  CL 108  --  109  CO2 22  --  19*  BUN 16  --  25*  CREATININE 1.09  --  2.55*  GLUCOSE 173*  --  148*      Hillard Goodwine Dee  Trauma Response RN  Please call TRN at [phone] for further assistance.
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
## 2023-04-27 NOTE — Progress Notes (Signed)
Transition of Care [facility]) - CAGE-AID Screening   Patient Details  Name: Paul Mitchell MRN: [id number] Date of Birth: 1950-06-28   Judie Bonus, RN Phone Number: 04/27/2023, 5:27 AM   Clinical Narrative: Unable to assess, pt is sedated and intubated.    CAGE-AID Screening: Substance Abuse Screening unable to be completed due to: : Patient unable to participate (Pt is sedated and intubated)

## 2023-04-27 NOTE — Progress Notes (Signed)
.  Subjective: 1 Day Post-Op Procedure(s) (LRB): WOUND EXPLORATION LEFT UPPER EXTREMITY (Left) I&D W/WOUND EXPLORATION LEFT EXTREMITY (Left) EXTERNAL FIXATION LEG (Right) APPLICATION OF WOUND VAC (Left) MINOR APPLICATION OF WOUND VAC (Right) IRRIGATION AND DEBRIDEMENT W/WOUND EXPLORATION LEFT LOWER EXTREMITY (Left)  Patient intubated and sedated in ICU. Creatinine increased this AM. No issues with wound vacs  Objective: Vital signs in last 24 hours: Temp:  [93.6 F (34.2 C)-98.2 F (36.8 C)] 96.4 F (35.8 C) (02/01 1200) Pulse Rate:  [71-90] 74 (02/01 1200) Resp:  [0-29] 20 (02/01 1200) BP: (98-149)/(62-101) 102/62 (02/01 1200) SpO2:  [94 %-100 %] 99 % (02/01 1200) Arterial Line BP: (86-160)/(51-94) 105/52 (02/01 1200) FiO2 (%):  [40 %-100 %] 40 % (02/01 0849) Weight:  [108.9 kg] 108.9 kg (01/31 1458)  Labs: Recent Labs    04/26/23 1625 04/26/23 1725 04/27/23 0433 04/27/23 0504  HGB 12.2* 10.9* 11.1* 10.2*   Recent Labs    04/26/23 1625 04/26/23 1725 04/27/23 0433 04/27/23 0504  WBC 9.7  --  9.3  --   RBC 4.12*  --  3.71*  --   HCT 35.8*   < > 31.8* 30.0*  PLT 78*  --  66*  --    < > = values in this interval not displayed.   Recent Labs    04/26/23 1625 04/26/23 1725 04/27/23 0433 04/27/23 0504  NA 139   < > 140 141  K 3.2*   < > 3.8 4.0  CL 108  --  109  --   CO2 22  --  19*  --   BUN 16  --  25*  --   CREATININE 1.09  --  2.55*  --   GLUCOSE 173*  --  148*  --   CALCIUM 7.5*  --  7.0*  --    < > = values in this interval not displayed.   No results for input(s): "LABPT", "INR" in the last 72 hours.  Physical Exam: Patient intubated and sedated LUE Dressing clean dy and intact Wound vac in place with good seal Palpable radial pulse  RLE Dressings clean dry and intact Wound vac in place with good seal Ex-fix in place, pin sites clean Palpable DP pulse  Assessment/Plan:  1 Day Post-Op Procedure(s) (LRB): WOUND EXPLORATION LEFT UPPER  EXTREMITY (Left) I&D W/WOUND EXPLORATION LEFT EXTREMITY (Left) EXTERNAL FIXATION LEG (Right) APPLICATION OF WOUND VAC (Left) MINOR APPLICATION OF WOUND VAC (Right) IRRIGATION AND DEBRIDEMENT W/WOUND EXPLORATION LEFT LOWER EXTREMITY (Left)  NWB LUE and RLE Continue wound vacs, reinforce as needed Completion XR do not show other fractures in LUE or RLE other than the right distal tibia fracture and right segmental fibula fracture Continue Ceftriaxone for open fracture prophylaxis Plan for repeat I&D with Dr. Jena Gauss on [place] as long as patient is stable for OR    Paul Mitchell 04/27/2023, 1:33 PM

## 2023-04-27 NOTE — Plan of Care (Signed)
  Problem: Clinical Measurements: Goal: Ability to maintain clinical measurements within normal limits will improve Outcome: Progressing Goal: Will remain free from infection Outcome: Progressing Goal: Diagnostic test results will improve Outcome: Progressing Goal: Respiratory complications will improve Outcome: Progressing Goal: Cardiovascular complication will be avoided Outcome: Progressing   Problem: Activity: Goal: Risk for activity intolerance will decrease Outcome: Progressing   Problem: Coping: Goal: Level of anxiety will decrease Outcome: Progressing   Problem: Pain Managment: Goal: General experience of comfort will improve and/or be controlled Outcome: Progressing   Problem: Safety: Goal: Ability to remain free from injury will improve Outcome: Progressing   Problem: Skin Integrity: Goal: Risk for impaired skin integrity will decrease Outcome: Progressing   Problem: Metabolic: Goal: Ability to maintain appropriate glucose levels will improve Outcome: Progressing   Problem: Nutritional: Goal: Progress toward achieving an optimal weight will improve Outcome: Progressing   Problem: Skin Integrity: Goal: Risk for impaired skin integrity will decrease Outcome: Progressing   Problem: Tissue Perfusion: Goal: Adequacy of tissue perfusion will improve Outcome: Progressing   Problem: Education: Goal: Knowledge of General Education information will improve Description: Including pain rating scale, medication(s)/side effects and non-pharmacologic comfort measures Outcome: Not Progressing   Problem: Health Behavior/Discharge Planning: Goal: Ability to manage health-related needs will improve Outcome: Not Progressing   Problem: Nutrition: Goal: Adequate nutrition will be maintained Outcome: Not Progressing   Problem: Elimination: Goal: Will not experience complications related to bowel motility Outcome: Not Progressing Goal: Will not experience  complications related to urinary retention Outcome: Not Progressing   Problem: Education: Goal: Ability to describe self-care measures that may prevent or decrease complications (Diabetes Survival Skills Education) will improve Outcome: Not Progressing Goal: Individualized Educational Video(s) Outcome: Not Progressing   Problem: Coping: Goal: Ability to adjust to condition or change in health will improve Outcome: Not Progressing   Problem: Fluid Volume: Goal: Ability to maintain a balanced intake and output will improve Outcome: Not Progressing   Problem: Health Behavior/Discharge Planning: Goal: Ability to identify and utilize available resources and services will improve Outcome: Not Progressing Goal: Ability to manage health-related needs will improve Outcome: Not Progressing   Problem: Nutritional: Goal: Maintenance of adequate nutrition will improve Outcome: Not Progressing

## 2023-04-27 DEATH — deceased

## 2023-04-28 ENCOUNTER — Inpatient Hospital Stay (HOSPITAL_COMMUNITY): Payer: Managed Care, Other (non HMO)

## 2023-04-28 LAB — TYPE AND SCREEN

## 2023-04-28 LAB — POCT I-STAT 7, (LYTES, BLD GAS, ICA,H+H)
Acid-base deficit: 2 mmol/L (ref 0.0–2.0)
Acid-base deficit: 5 mmol/L — ABNORMAL HIGH (ref 0.0–2.0)
Acid-base deficit: 5 mmol/L — ABNORMAL HIGH (ref 0.0–2.0)
Bicarbonate: 23.9 mmol/L (ref 20.0–28.0)
Bicarbonate: 24.8 mmol/L (ref 20.0–28.0)
Bicarbonate: 21.5 mmol/L (ref 20.0–28.0)
Calcium, Ion: 0.97 mmol/L — ABNORMAL LOW (ref 1.15–1.40)
Calcium, Ion: 0.99 mmol/L — ABNORMAL LOW (ref 1.15–1.40)
Calcium, Ion: 0.87 mmol/L — CL (ref 1.15–1.40)
HCT: 22 % — ABNORMAL LOW (ref 39.0–52.0)
HCT: 25 % — ABNORMAL LOW (ref 39.0–52.0)
HCT: 25 % — ABNORMAL LOW (ref 39.0–52.0)
Hemoglobin: 7.5 g/dL — ABNORMAL LOW (ref 13.0–17.0)
Hemoglobin: 8.5 g/dL — ABNORMAL LOW (ref 13.0–17.0)
Hemoglobin: 8.5 g/dL — ABNORMAL LOW (ref 13.0–17.0)
O2 Saturation: 100 %
O2 Saturation: 100 %
O2 Saturation: 100 %
Potassium: 3.6 mmol/L (ref 3.5–5.1)
Potassium: 3.8 mmol/L (ref 3.5–5.1)
Potassium: 3.4 mmol/L — ABNORMAL LOW (ref 3.5–5.1)
Sodium: 140 mmol/L (ref 135–145)
Sodium: 141 mmol/L (ref 135–145)
Sodium: 140 mmol/L (ref 135–145)
TCO2: 26 mmol/L (ref 22–32)
TCO2: 26 mmol/L (ref 22–32)
TCO2: 23 mmol/L (ref 22–32)
pCO2 arterial: 54.7 mmHg — ABNORMAL HIGH (ref 32–48)
pCO2 arterial: 67.5 mmHg (ref 32–48)
pCO2 arterial: 43 mmHg (ref 32–48)
pH, Arterial: 7.157 — CL (ref 7.35–7.45)
pH, Arterial: 7.264 — ABNORMAL LOW (ref 7.35–7.45)
pH, Arterial: 7.306 — ABNORMAL LOW (ref 7.35–7.45)
pO2, Arterial: 237 mmHg — ABNORMAL HIGH (ref 83–108)
pO2, Arterial: 316 mmHg — ABNORMAL HIGH (ref 83–108)
pO2, Arterial: 252 mmHg — ABNORMAL HIGH (ref 83–108)

## 2023-04-28 LAB — GLUCOSE, CAPILLARY
Glucose-Capillary: 116 mg/dL — ABNORMAL HIGH (ref 70–99)
Glucose-Capillary: 127 mg/dL — ABNORMAL HIGH (ref 70–99)
Glucose-Capillary: 131 mg/dL — ABNORMAL HIGH (ref 70–99)
Glucose-Capillary: 139 mg/dL — ABNORMAL HIGH (ref 70–99)
Glucose-Capillary: 140 mg/dL — ABNORMAL HIGH (ref 70–99)
Glucose-Capillary: 141 mg/dL — ABNORMAL HIGH (ref 70–99)

## 2023-04-28 LAB — RENAL FUNCTION PANEL
Albumin: 2.2 g/dL — ABNORMAL LOW (ref 3.5–5.0)
Anion gap: 13 (ref 5–15)
BUN: 49 mg/dL — ABNORMAL HIGH (ref 8–23)
CO2: 18 mmol/L — ABNORMAL LOW (ref 22–32)
Calcium: 7.2 mg/dL — ABNORMAL LOW (ref 8.9–10.3)
Chloride: 108 mmol/L (ref 98–111)
Creatinine, Ser: 5.39 mg/dL — ABNORMAL HIGH (ref 0.61–1.24)
GFR, Estimated: 11 mL/min — ABNORMAL LOW (ref >60)
Glucose, Bld: 147 mg/dL — ABNORMAL HIGH (ref 70–99)
Phosphorus: 5.2 mg/dL — ABNORMAL HIGH (ref 2.5–4.6)
Potassium: 4.3 mmol/L (ref 3.5–5.1)
Sodium: 139 mmol/L (ref 135–145)

## 2023-04-28 LAB — BASIC METABOLIC PANEL
Anion gap: 12 (ref 5–15)
BUN: 44 mg/dL — ABNORMAL HIGH (ref 8–23)
CO2: 18 mmol/L — ABNORMAL LOW (ref 22–32)
Calcium: 7.2 mg/dL — ABNORMAL LOW (ref 8.9–10.3)
Chloride: 109 mmol/L (ref 98–111)
Creatinine, Ser: 5.1 mg/dL — ABNORMAL HIGH (ref 0.61–1.24)
GFR, Estimated: 11 mL/min — ABNORMAL LOW (ref >60)
Glucose, Bld: 146 mg/dL — ABNORMAL HIGH (ref 70–99)
Potassium: 4.3 mmol/L (ref 3.5–5.1)
Sodium: 139 mmol/L (ref 135–145)

## 2023-04-28 LAB — MAGNESIUM
Magnesium: 1.9 mg/dL (ref 1.7–2.4)
Magnesium: 2.1 mg/dL (ref 1.7–2.4)

## 2023-04-28 LAB — PHOSPHORUS: Phosphorus: 5.8 mg/dL — ABNORMAL HIGH (ref 2.5–4.6)

## 2023-04-28 LAB — CBC
HCT: 28.6 % — ABNORMAL LOW (ref 39.0–52.0)
Hemoglobin: 9.6 g/dL — ABNORMAL LOW (ref 13.0–17.0)
MCH: 29.8 pg (ref 26.0–34.0)
MCHC: 33.6 g/dL (ref 30.0–36.0)
MCV: 88.8 fL (ref 80.0–100.0)
Platelets: 59 10*3/uL — ABNORMAL LOW (ref 150–400)
RBC: 3.22 MIL/uL — ABNORMAL LOW (ref 4.22–5.81)
RDW: 16.7 % — ABNORMAL HIGH (ref 11.5–15.5)
WBC: 14.2 10*3/uL — ABNORMAL HIGH (ref 4.0–10.5)
nRBC: 0 % (ref 0.0–0.2)

## 2023-04-28 MED ORDER — HEPARIN SODIUM (PORCINE) 5000 UNIT/ML IJ SOLN
5000.0000 [IU] | Freq: Three times a day (TID) | INTRAMUSCULAR | Status: DC
  Administered 2023-04-28 – 2023-05-01 (×7): 5000 [IU] via SUBCUTANEOUS
  Filled 2023-04-28 (×7): qty 1

## 2023-04-28 MED ORDER — SODIUM CHLORIDE 0.9 % FOR CRRT: INTRAVENOUS_CENTRAL | Status: DC | PRN

## 2023-04-28 MED ORDER — SODIUM CHLORIDE 0.9 % IV SOLN
750.0000 mg | Freq: Two times a day (BID) | INTRAVENOUS | Status: AC
  Administered 2023-04-28 – 2023-05-03 (×10): 750 mg via INTRAVENOUS
  Filled 2023-04-28 (×10): qty 7.5

## 2023-04-28 MED ORDER — PRISMASOL BGK 4/2.5 32-4-2.5 MEQ/L EC SOLN
Status: DC
  Filled 2023-04-28 (×33): qty 5000

## 2023-04-28 MED ORDER — HEPARIN SODIUM (PORCINE) 1000 UNIT/ML IJ SOLN
INTRAMUSCULAR | Status: AC
  Administered 2023-04-28: 2400 [IU]
  Filled 2023-04-28: qty 3

## 2023-04-28 MED ORDER — HEPARIN SODIUM (PORCINE) 1000 UNIT/ML DIALYSIS
1000.0000 [IU] | INTRAMUSCULAR | Status: DC | PRN
  Administered 2023-04-29 – 2023-05-01 (×2): 2400 [IU] via INTRAVENOUS_CENTRAL
  Administered 2023-05-02: 3000 [IU] via INTRAVENOUS_CENTRAL
  Administered 2023-05-06: 2400 [IU] via INTRAVENOUS_CENTRAL
  Filled 2023-04-28 (×2): qty 6
  Filled 2023-04-28 (×2): qty 3
  Filled 2023-04-28: qty 6
  Filled 2023-04-28: qty 3
  Filled 2023-04-28: qty 6
  Filled 2023-04-28: qty 3

## 2023-04-28 MED ORDER — PRISMASOL BGK 4/2.5 32-4-2.5 MEQ/L REPLACEMENT SOLN
Status: DC
  Filled 2023-04-28 (×7): qty 5000

## 2023-04-28 NOTE — Progress Notes (Signed)
PHARMACY NOTE:  RENAL DOSAGE ADJUSTMENT  Current  regimen includes a mismatch between dosage and estimated renal function.  As per policy approved by the Pharmacy & Therapeutics and Medical Executive Committees, the dosage will be adjusted accordingly.  Current dosage:  keppra 500mg  IV q12 hours x 7 days  Indication: seizure prophylaxis  Renal Function:  Estimated Creatinine Clearance: 17.6 mL/min (A) (by C-G formula based on SCr of 5.1 mg/dL (H)). []      On intermittent HD, scheduled: [x]      On CRRT    Antimicrobial dosage has been changed to:  keppra 750mg  IV q12 hours x 7 days  Additional comments:   Thank you for allowing pharmacy to be a part of this patient's care.  Rexford Maus, PharmD, BCPS 04/28/2023 1:53 PM

## 2023-04-28 NOTE — Progress Notes (Signed)
.  Subjective: 2 Days Post-Op Procedure(s) (LRB): WOUND EXPLORATION LEFT UPPER EXTREMITY (Left) I&D W/WOUND EXPLORATION LEFT EXTREMITY (Left) EXTERNAL FIXATION LEG (Right) APPLICATION OF WOUND VAC (Left) MINOR APPLICATION OF WOUND VAC (Right) IRRIGATION AND DEBRIDEMENT W/WOUND EXPLORATION LEFT LOWER EXTREMITY (Left)  Patient intubated and sedated in ICU. Creatinine increased to 5 this AM. Likely plan for dialysis. No issues with wound vacs   Objective: Vital signs in last 24 hours: Temp:  [96.4 F (35.8 C)-98.2 F (36.8 C)] 98.1 F (36.7 C) (02/02 1000) Pulse Rate:  [63-75] 66 (02/02 1000) Resp:  [12-29] 17 (02/02 1000) BP: (98-151)/(56-71) 121/65 (02/02 1000) SpO2:  [97 %-100 %] 100 % (02/02 1000) Arterial Line BP: (99-134)/(47-65) 102/50 (02/02 1000) FiO2 (%):  [40 %] 40 % (02/02 0340) Weight:  [113.8 kg] 113.8 kg (02/02 0500)  Labs: Recent Labs    04/26/23 1725 04/27/23 0433 04/27/23 0504 04/27/23 1455 04/28/23 0445  HGB 10.9* 11.1* 10.2* 10.2* 9.6*   Recent Labs    04/27/23 1455 04/28/23 0445  WBC 9.9 14.2*  RBC 3.45* 3.22*  HCT 30.0* 28.6*  PLT 61* 59*   Recent Labs    04/27/23 1455 04/28/23 0445  NA 138 139  K 4.1 4.3  CL 107 109  CO2 19* 18*  BUN 31* 44*  CREATININE 3.71* 5.10*  GLUCOSE 143* 146*  CALCIUM 7.1* 7.2*   No results for input(s): "LABPT", "INR" in the last 72 hours.  Physical Exam: Patient intubated and sedated LUE Dressing clean dy and intact Wound vac in place with good seal Palpable radial pulse   RLE Dressings clean dry and intact Wound vac in place with good seal Ex-fix in place, pin sites clean Palpable DP pulse  Assessment/Plan:  2 Days Post-Op Procedure(s) (LRB): WOUND EXPLORATION LEFT UPPER EXTREMITY (Left) I&D W/WOUND EXPLORATION LEFT EXTREMITY (Left) EXTERNAL FIXATION LEG (Right) APPLICATION OF WOUND VAC (Left) MINOR APPLICATION OF WOUND VAC (Right) IRRIGATION AND DEBRIDEMENT W/WOUND EXPLORATION LEFT LOWER  EXTREMITY (Left)  NWB LUE and RLE Continue wound vacs, reinforce as needed Continue Ceftriaxone for open fracture prophylaxis Plan for repeat I&D with Dr. Jena Gauss on [place] as long as patient is stable for OR Rest of management per ICU/trauma team    Luci Bank 04/28/2023, 10:47 AM

## 2023-04-28 NOTE — Progress Notes (Signed)
   Providing Compassionate, Quality Care - Together  NEUROSURGERY PROGRESS NOTE   S: No issues overnight.   O: EXAM:  BP 124/61 (BP Location: Left Arm)   Pulse 65   Temp 97.7 F (36.5 C) (Esophageal)   Resp 20   Ht 6' (1.829 m)   Wt 113.8 kg   SpO2 98%   BMI 34.03 kg/m   Intubated, sedated Eyes closed to pain PERRL, midline gaze Right lower extremity Ex-Fix Left upper extremity wrapped Withdrawal to painx4  ASSESSMENT:  73 y.o. male with    Bilateral subdural hematomas, contusions, subarachnoid hemorrhage, skull fractures   PLAN: -Continue supportive care -Okay to start DVT prophylaxis, prefer subcu heparin -Keppra for seizure prophylaxis     Thank you for allowing me to participate in this patient's care.  Please do not hesitate to call with questions or concerns.   Monia Pouch, DO Neurosurgeon [facility] [phone]
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
## 2023-04-28 NOTE — Progress Notes (Addendum)
2000 - Loma Messing, MD regarding pt being anuric. Discussed evening labs for this pt and that morning labs are scheduled for 0500. Janee Morn, MD requested CVP. Notified Janee Morn, MD of CVP results.   2100 - New order for lasix. Lasix given.   0000 - Pt continues to be anuric. Notified Janee Morn, MD. This RN will continue to monitor and assess the pt and notify of any changes. No new orders at this time.   0600 - Notified Janee Morn, MD of morning labs of creatinine at 5.10.

## 2023-04-28 NOTE — Procedures (Signed)
Central line  Date/Time: 04/28/2023 8:36 AM  Performed by: Rodman Pickle, MD Authorized by: Rodman Pickle, MD   Consent:    Consent obtained:  Written   Consent given by:  Spouse   Risks, benefits, and alternatives were discussed: yes   Universal protocol:    Procedure explained and questions answered to patient or proxy's satisfaction: yes     Relevant documents present and verified: yes     Test results available: yes     Imaging studies available: yes     Patient identity confirmed:  Hospital-assigned identification number Pre-procedure details:    Indication(s): central venous access     Hand hygiene: Hand hygiene performed prior to insertion     Sterile barrier technique: All elements of maximal sterile technique followed     Skin preparation:  Chlorhexidine with alcohol Sedation:    Sedation type:  Deep Procedure details:    Location:  R internal jugular   Patient position:  Trendelenburg   Procedural supplies:  Triple lumen   Catheter size:  13.5 Fr   Landmarks identified: yes     Ultrasound guidance: yes     Ultrasound guidance timing: prior to insertion and real time     Sterile ultrasound techniques: Sterile gel and sterile probe covers were used     Number of attempts:  1   Successful placement: yes   Post-procedure details:    Post-procedure:  Dressing applied and line sutured   Assessment:  Blood return through all ports, free fluid flow and placement verified by x-ray   Procedure completion:  Tolerated

## 2023-04-28 NOTE — Progress Notes (Addendum)
  Have discussed with Dr. Jena Gauss plan for repeat debridement and assessment tomorrow in the operating room with return to the OR later in the week with Dr. Jena Gauss or myself for definitive repair if conditions permit.   Amputation or transfer to outside facility for plastic surgery services may be indicated if infection and soft tissue breakdown occur.   Myrene Galas, MD [facility], [facility] [phone]

## 2023-04-29 ENCOUNTER — Inpatient Hospital Stay (HOSPITAL_COMMUNITY): Payer: Managed Care, Other (non HMO)

## 2023-04-29 ENCOUNTER — Inpatient Hospital Stay (HOSPITAL_COMMUNITY): Payer: Managed Care, Other (non HMO) | Admitting: Anesthesiology

## 2023-04-29 ENCOUNTER — Encounter (HOSPITAL_COMMUNITY): Admission: EM | Disposition: E | Payer: Self-pay | Source: Home / Self Care

## 2023-04-29 ENCOUNTER — Encounter (HOSPITAL_COMMUNITY): Payer: Self-pay | Admitting: Vascular Surgery

## 2023-04-29 DIAGNOSIS — S82401C Unspecified fracture of shaft of right fibula, initial encounter for open fracture type IIIA, IIIB, or IIIC: Secondary | ICD-10-CM

## 2023-04-29 DIAGNOSIS — S4982XA Other specified injuries of left shoulder and upper arm, initial encounter: Secondary | ICD-10-CM

## 2023-04-29 DIAGNOSIS — S82201C Unspecified fracture of shaft of right tibia, initial encounter for open fracture type IIIA, IIIB, or IIIC: Secondary | ICD-10-CM | POA: Diagnosis not present

## 2023-04-29 DIAGNOSIS — S82291C Other fracture of shaft of right tibia, initial encounter for open fracture type IIIA, IIIB, or IIIC: Secondary | ICD-10-CM

## 2023-04-29 HISTORY — PX: INCISION AND DRAINAGE OF WOUND: SHX1803

## 2023-04-29 HISTORY — PX: I & D EXTREMITY: SHX5045

## 2023-04-29 HISTORY — PX: EXTERNAL FIXATION LEG: SHX1549

## 2023-04-29 HISTORY — PX: EXCISIONAL TOTAL KNEE ARTHROPLASTY WITH ANTIBIOTIC SPACERS: SHX5827

## 2023-04-29 LAB — MAGNESIUM: Magnesium: 2.2 mg/dL (ref 1.7–2.4)

## 2023-04-29 LAB — RENAL FUNCTION PANEL
Albumin: 2.1 g/dL — ABNORMAL LOW (ref 3.5–5.0)
Albumin: 1.9 g/dL — ABNORMAL LOW (ref 3.5–5.0)
Anion gap: 8 (ref 5–15)
Anion gap: 10 (ref 5–15)
BUN: 44 mg/dL — ABNORMAL HIGH (ref 8–23)
BUN: 46 mg/dL — ABNORMAL HIGH (ref 8–23)
CO2: 23 mmol/L (ref 22–32)
CO2: 20 mmol/L — ABNORMAL LOW (ref 22–32)
Calcium: 7.3 mg/dL — ABNORMAL LOW (ref 8.9–10.3)
Calcium: 7.1 mg/dL — ABNORMAL LOW (ref 8.9–10.3)
Chloride: 107 mmol/L (ref 98–111)
Chloride: 110 mmol/L (ref 98–111)
Creatinine, Ser: 4.1 mg/dL — ABNORMAL HIGH (ref 0.61–1.24)
Creatinine, Ser: 4.28 mg/dL — ABNORMAL HIGH (ref 0.61–1.24)
GFR, Estimated: 15 mL/min — ABNORMAL LOW (ref >60)
GFR, Estimated: 14 mL/min — ABNORMAL LOW (ref >60)
Glucose, Bld: 132 mg/dL — ABNORMAL HIGH (ref 70–99)
Glucose, Bld: 112 mg/dL — ABNORMAL HIGH (ref 70–99)
Phosphorus: 4.6 mg/dL (ref 2.5–4.6)
Phosphorus: 7 mg/dL — ABNORMAL HIGH (ref 2.5–4.6)
Potassium: 4.5 mmol/L (ref 3.5–5.1)
Potassium: 4.9 mmol/L (ref 3.5–5.1)
Sodium: 138 mmol/L (ref 135–145)
Sodium: 140 mmol/L (ref 135–145)

## 2023-04-29 LAB — CBC
HCT: 27.5 % — ABNORMAL LOW (ref 39.0–52.0)
Hemoglobin: 9.3 g/dL — ABNORMAL LOW (ref 13.0–17.0)
MCH: 30 pg (ref 26.0–34.0)
MCHC: 33.8 g/dL (ref 30.0–36.0)
MCV: 88.7 fL (ref 80.0–100.0)
Platelets: 80 10*3/uL — ABNORMAL LOW (ref 150–400)
RBC: 3.1 MIL/uL — ABNORMAL LOW (ref 4.22–5.81)
RDW: 17.1 % — ABNORMAL HIGH (ref 11.5–15.5)
WBC: 15 10*3/uL — ABNORMAL HIGH (ref 4.0–10.5)
nRBC: 0 % (ref 0.0–0.2)

## 2023-04-29 LAB — BASIC METABOLIC PANEL
Anion gap: 7 (ref 5–15)
BUN: 43 mg/dL — ABNORMAL HIGH (ref 8–23)
CO2: 22 mmol/L (ref 22–32)
Calcium: 7 mg/dL — ABNORMAL LOW (ref 8.9–10.3)
Chloride: 109 mmol/L (ref 98–111)
Creatinine, Ser: 4 mg/dL — ABNORMAL HIGH (ref 0.61–1.24)
GFR, Estimated: 15 mL/min — ABNORMAL LOW (ref >60)
Glucose, Bld: 128 mg/dL — ABNORMAL HIGH (ref 70–99)
Potassium: 4.4 mmol/L (ref 3.5–5.1)
Sodium: 138 mmol/L (ref 135–145)

## 2023-04-29 LAB — GLUCOSE, CAPILLARY
Glucose-Capillary: 144 mg/dL — ABNORMAL HIGH (ref 70–99)
Glucose-Capillary: 117 mg/dL — ABNORMAL HIGH (ref 70–99)
Glucose-Capillary: 116 mg/dL — ABNORMAL HIGH (ref 70–99)
Glucose-Capillary: 105 mg/dL — ABNORMAL HIGH (ref 70–99)
Glucose-Capillary: 88 mg/dL (ref 70–99)
Glucose-Capillary: 117 mg/dL — ABNORMAL HIGH (ref 70–99)

## 2023-04-29 LAB — CK: Total CK: 679 U/L — ABNORMAL HIGH (ref 49–397)

## 2023-04-29 LAB — PREPARE RBC (CROSSMATCH)

## 2023-04-29 LAB — LACTIC ACID, PLASMA: Lactic Acid, Venous: 1.4 mmol/L (ref 0.5–1.9)

## 2023-04-29 SURGERY — IRRIGATION AND DEBRIDEMENT WOUND
Anesthesia: General | Site: Leg Lower | Laterality: Right

## 2023-04-29 MED ORDER — SODIUM CHLORIDE 0.9 % IV SOLN: INTRAVENOUS | Status: DC | PRN

## 2023-04-29 MED ORDER — SODIUM CHLORIDE 0.9 % IV SOLN: INTRAVENOUS | Status: AC | PRN

## 2023-04-29 MED ORDER — VANCOMYCIN HCL 1000 MG IV SOLR
INTRAVENOUS | Status: DC | PRN
  Administered 2023-04-29: 1000 mg

## 2023-04-29 MED ORDER — SODIUM CHLORIDE 0.9 % IV SOLN
2.0000 g | INTRAVENOUS | Status: AC
  Administered 2023-04-30 – 2023-05-02 (×3): 2 g via INTRAVENOUS
  Filled 2023-04-29 (×3): qty 20

## 2023-04-29 MED ORDER — EPHEDRINE SULFATE (PRESSORS) 50 MG/ML IJ SOLN
INTRAMUSCULAR | Status: DC | PRN
  Administered 2023-04-29 (×2): 5 mg via INTRAVENOUS

## 2023-04-29 MED ORDER — SODIUM CHLORIDE 0.9 % IR SOLN
Status: DC | PRN
  Administered 2023-04-29: 3000 mL

## 2023-04-29 MED ORDER — SODIUM CHLORIDE 0.9 % IV SOLN: 10.0000 mL/h | Freq: Once | INTRAVENOUS | Status: AC

## 2023-04-29 MED ORDER — METHYLENE BLUE (ANTIDOTE) 1 % IV SOLN
INTRAVENOUS | Status: DC | PRN
  Administered 2023-04-29: 1 mL

## 2023-04-29 MED ORDER — FENTANYL CITRATE (PF) 250 MCG/5ML IJ SOLN
INTRAMUSCULAR | Status: DC | PRN
  Administered 2023-04-29 (×3): 50 ug via INTRAVENOUS

## 2023-04-29 MED ORDER — ORAL CARE MOUTH RINSE: 15.0000 mL | Freq: Once | OROMUCOSAL | Status: DC

## 2023-04-29 MED ORDER — CHLORHEXIDINE GLUCONATE 0.12 % MT SOLN: 15.0000 mL | Freq: Once | OROMUCOSAL | Status: DC

## 2023-04-29 MED ORDER — SUGAMMADEX SODIUM 200 MG/2ML IV SOLN
INTRAVENOUS | Status: DC | PRN
  Administered 2023-04-29: 200 mg via INTRAVENOUS

## 2023-04-29 MED ORDER — TOBRAMYCIN SULFATE 1.2 G IJ SOLR
INTRAMUSCULAR | Status: AC
  Filled 2023-04-29: qty 1.2

## 2023-04-29 MED ORDER — TOBRAMYCIN SULFATE 1.2 G IJ SOLR
INTRAMUSCULAR | Status: DC | PRN
  Administered 2023-04-29: 1.2 g

## 2023-04-29 MED ORDER — HYDROMORPHONE HCL 1 MG/ML IJ SOLN
INTRAMUSCULAR | Status: AC
  Filled 2023-04-29: qty 0.5

## 2023-04-29 MED ORDER — MIDAZOLAM HCL 2 MG/2ML IJ SOLN
INTRAMUSCULAR | Status: AC
  Filled 2023-04-29: qty 2

## 2023-04-29 MED ORDER — HYDROMORPHONE HCL 1 MG/ML IJ SOLN
INTRAMUSCULAR | Status: DC | PRN
  Administered 2023-04-29 (×2): .5 mg via INTRAVENOUS

## 2023-04-29 MED ORDER — PIVOT 1.5 CAL PO LIQD
1000.0000 mL | ORAL | Status: DC
  Administered 2023-04-30 – 2023-05-05 (×7): 1000 mL

## 2023-04-29 MED ORDER — METHYLENE BLUE (ANTIDOTE) 1 % IV SOLN
INTRAVENOUS | Status: AC
  Filled 2023-04-29: qty 10

## 2023-04-29 MED ORDER — LACTATED RINGERS IV SOLN: INTRAVENOUS | Status: DC | PRN

## 2023-04-29 MED ORDER — SODIUM CHLORIDE 0.9 % IV SOLN: INTRAVENOUS | Status: AC

## 2023-04-29 MED ORDER — PHENYLEPHRINE HCL (PRESSORS) 10 MG/ML IV SOLN
INTRAVENOUS | Status: DC | PRN
  Administered 2023-04-29: 80 ug via INTRAVENOUS
  Administered 2023-04-29: 160 ug via INTRAVENOUS

## 2023-04-29 MED ORDER — PROPOFOL 500 MG/50ML IV EMUL
INTRAVENOUS | Status: DC | PRN
  Administered 2023-04-29: 50 ug/kg/min via INTRAVENOUS

## 2023-04-29 MED ORDER — MIDAZOLAM HCL 2 MG/2ML IJ SOLN
INTRAMUSCULAR | Status: DC | PRN
  Administered 2023-04-29: 2 mg via INTRAVENOUS

## 2023-04-29 MED ORDER — VANCOMYCIN HCL 1000 MG IV SOLR
INTRAVENOUS | Status: AC
  Filled 2023-04-29: qty 20

## 2023-04-29 MED ORDER — PHENYLEPHRINE HCL-NACL 20-0.9 MG/250ML-% IV SOLN
INTRAVENOUS | Status: DC | PRN
  Administered 2023-04-29: 30 ug/min via INTRAVENOUS

## 2023-04-29 MED ORDER — ROCURONIUM BROMIDE 10 MG/ML (PF) SYRINGE
PREFILLED_SYRINGE | INTRAVENOUS | Status: DC | PRN
  Administered 2023-04-29 (×3): 20 mg via INTRAVENOUS

## 2023-04-29 MED ORDER — PROPOFOL 10 MG/ML IV BOLUS
INTRAVENOUS | Status: AC
  Filled 2023-04-29: qty 20

## 2023-04-29 MED ORDER — 0.9 % SODIUM CHLORIDE (POUR BTL) OPTIME
TOPICAL | Status: DC | PRN
  Administered 2023-04-29: 1000 mL

## 2023-04-29 MED ORDER — FENTANYL CITRATE (PF) 250 MCG/5ML IJ SOLN
INTRAMUSCULAR | Status: AC
  Filled 2023-04-29: qty 5

## 2023-04-29 SURGICAL SUPPLY — 66 items
BAG COUNTER SPONGE SURGICOUNT (BAG) IMPLANT
BNDG COHESIVE 4X5 TAN STRL (GAUZE/BANDAGES/DRESSINGS) ×2 IMPLANT
BNDG ELASTIC 3INX 5YD STR LF (GAUZE/BANDAGES/DRESSINGS) IMPLANT
BNDG ELASTIC 4INX 5YD STR LF (GAUZE/BANDAGES/DRESSINGS) IMPLANT
BNDG GAUZE DERMACEA FLUFF 4 (GAUZE/BANDAGES/DRESSINGS) ×4 IMPLANT
BRUSH SCRUB EZ PLAIN DRY (MISCELLANEOUS) ×4 IMPLANT
CANISTER WOUNDNEG PRESSURE 500 (CANNISTER) IMPLANT
CEMENT BONE R 1X40 (Cement) IMPLANT
CLAMP BLUE BAR TO PIN (EXFIX) IMPLANT
COVER SURGICAL LIGHT HANDLE (MISCELLANEOUS) ×4 IMPLANT
DRAPE DERMATAC (DRAPES) IMPLANT
DRAPE U-SHAPE 47X51 STRL (DRAPES) ×2 IMPLANT
DRESSING MEPILEX FLEX 4X4 (GAUZE/BANDAGES/DRESSINGS) IMPLANT
DRESSING MORCELLS FINE 1000 (Tissue) IMPLANT
DRESSING VERAFLO CLEANS CC MED (GAUZE/BANDAGES/DRESSINGS) IMPLANT
DRESSING VERAFLO CLEANSE CC LG (GAUZE/BANDAGES/DRESSINGS) IMPLANT
DRSG ADAPTIC 3X8 NADH LF (GAUZE/BANDAGES/DRESSINGS) ×2 IMPLANT
DRSG CUTIMED SORBACT 7X9 (GAUZE/BANDAGES/DRESSINGS) IMPLANT
DRSG MEPILEX FLEX 4X4 (GAUZE/BANDAGES/DRESSINGS) ×2 IMPLANT
DRSG MEPITEL 8X12 (GAUZE/BANDAGES/DRESSINGS) IMPLANT
DRSG VERAFLO CLEANSE CC LG (GAUZE/BANDAGES/DRESSINGS) ×2 IMPLANT
DRSG VERAFLO CLEANSE CC MED (GAUZE/BANDAGES/DRESSINGS) ×4 IMPLANT
DRSG VERSA FOAM LRG 10X15 (GAUZE/BANDAGES/DRESSINGS) IMPLANT
ELECT REM PT RETURN 9FT ADLT (ELECTROSURGICAL) ×2 IMPLANT
ELECTRODE REM PT RTRN 9FT ADLT (ELECTROSURGICAL) IMPLANT
GAUZE PAD ABD 8X10 STRL (GAUZE/BANDAGES/DRESSINGS) IMPLANT
GAUZE SPONGE 4X4 12PLY STRL (GAUZE/BANDAGES/DRESSINGS) ×2 IMPLANT
GLOVE BIO SURGEON STRL SZ7.5 (GLOVE) ×2 IMPLANT
GLOVE BIO SURGEON STRL SZ8 (GLOVE) ×2 IMPLANT
GLOVE BIOGEL PI IND STRL 7.5 (GLOVE) ×2 IMPLANT
GLOVE BIOGEL PI IND STRL 8 (GLOVE) ×2 IMPLANT
GLOVE BIOGEL PI IND STRL 9 (GLOVE) ×2 IMPLANT
GLOVE SURG ORTHO LTX SZ7.5 (GLOVE) ×4 IMPLANT
GOWN STRL REUS W/ TWL LRG LVL3 (GOWN DISPOSABLE) ×4 IMPLANT
GOWN STRL REUS W/ TWL XL LVL3 (GOWN DISPOSABLE) ×2 IMPLANT
GRAFT MYRIAD 20X20 (Graft) IMPLANT
KIT BASIN OR (CUSTOM PROCEDURE TRAY) ×2 IMPLANT
KIT TURNOVER KIT B (KITS) ×2 IMPLANT
MANIFOLD NEPTUNE II (INSTRUMENTS) ×2 IMPLANT
NS IRRIG 1000ML POUR BTL (IV SOLUTION) ×2 IMPLANT
PACK ORTHO EXTREMITY (CUSTOM PROCEDURE TRAY) ×2 IMPLANT
PAD ARMBOARD 7.5X6 YLW CONV (MISCELLANEOUS) ×4 IMPLANT
PAD CAST 3X4 CTTN HI CHSV (CAST SUPPLIES) IMPLANT
PAD CAST 4YDX4 CTTN HI CHSV (CAST SUPPLIES) IMPLANT
PAD NEG PRESSURE SENSATRAC (MISCELLANEOUS) IMPLANT
PADDING CAST ABS COTTON 4X4 ST (CAST SUPPLIES) IMPLANT
PADDING CAST COTTON 6X4 STRL (CAST SUPPLIES) ×2 IMPLANT
PADDING UNDERCAST 2X4 STRL (CAST SUPPLIES) IMPLANT
SET HNDPC FAN SPRY TIP SCT (DISPOSABLE) IMPLANT
SOL PREP POV-IOD 4OZ 10% (MISCELLANEOUS) ×2 IMPLANT
SOL SCRUB PVP POV-IOD 4OZ 7.5% (MISCELLANEOUS) ×4 IMPLANT
SOLUTION SCRB POV-IOD 4OZ 7.5% (MISCELLANEOUS) ×2 IMPLANT
SPLINT FIBERGLASS 4X30 (CAST SUPPLIES) IMPLANT
SPONGE T-LAP 18X18 ~~LOC~~+RFID (SPONGE) ×2 IMPLANT
STOCKINETTE IMPERVIOUS 9X36 MD (GAUZE/BANDAGES/DRESSINGS) IMPLANT
SUT ETHILON 2 0 PSLX (SUTURE) IMPLANT
SUT ETHILON 3 0 PS 1 (SUTURE) IMPLANT
SUT PDS AB 2-0 CT1 27 (SUTURE) IMPLANT
SUT PDS AB 3-0 SH 27 (SUTURE) IMPLANT
SUT PROLENE 4-0 RB1 .5 CRCL 36 (SUTURE) IMPLANT
TOWEL GREEN STERILE (TOWEL DISPOSABLE) ×4 IMPLANT
TOWEL GREEN STERILE FF (TOWEL DISPOSABLE) ×2 IMPLANT
TUBE CONNECTING 12X1/4 (SUCTIONS) ×2 IMPLANT
UNDERPAD 30X36 HEAVY ABSORB (UNDERPADS AND DIAPERS) ×2 IMPLANT
WATER STERILE IRR 1000ML POUR (IV SOLUTION) ×2 IMPLANT
YANKAUER SUCT BULB TIP NO VENT (SUCTIONS) ×2 IMPLANT

## 2023-04-29 NOTE — Transfer of Care (Signed)
Immediate Anesthesia Transfer of Care Note  Patient: Paul Mitchell  Procedure(s) Performed: IRRIGATION AND DEBRIDEMENT LEFT UPPER EXTREMITY (Left: Arm Lower) IRRIGATION AND DEBRIDEMENT RIGHT LOWER EXTREMITY (Right: Leg Lower) PLACEMENT OF ANTIBIOTIC SPACER RIGHT LOWER LEG (Right: Leg Lower) REVISION OF EXTERNAL FIXATION LEG (Right: Leg Lower)  Patient Location: ICU  Anesthesia Type:General  Level of Consciousness: sedated and Patient remains intubated per anesthesia plan  Airway & Oxygen Therapy: Patient remains intubated per anesthesia plan and Patient placed on Ventilator (see vital sign flow sheet for setting)  Post-op Assessment: Report given to RN and Post -op Vital signs reviewed and stable  Post vital signs: Reviewed and stable  Last Vitals:  Vitals Value Taken Time  BP 109/59 04/29/2023 1615  Temp    Pulse 71 04/29/23 1615  Resp 12 04/29/23 1615  SpO2 93 % 04/29/23 1615  Vitals shown include unfiled device data.  Last Pain:  Vitals:   04/29/23 1114  TempSrc: Axillary         Complications: No notable events documented.

## 2023-04-29 NOTE — Anesthesia Preprocedure Evaluation (Signed)
Anesthesia Evaluation  Patient identified by MRN, date of birth, ID band Patient unresponsive    Reviewed: Allergy & Precautions, NPO status , Patient's Chart, lab work & pertinent test results  Airway Mallampati: Intubated       Dental   Pulmonary  S/p R chest tube Intubated on mechanical ventilation    + decreased breath sounds      Cardiovascular negative cardio ROS  Rhythm:Regular Rate:Tachycardia  Reportedly on eliquis, unknown indication. Unknown cardiac history.    Neuro/Psych Sedated   negative psych ROS   GI/Hepatic negative GI ROS, Neg liver ROS,,,  Endo/Other  negative endocrine ROS    Renal/GU ARFRenal diseaseOn CRRT  negative genitourinary   Musculoskeletal Multiple orthopedic injuries Rib Fx's, LUE fx's RLE injury   Abdominal  (+) + obese  Peds  Hematology  (+) Blood dyscrasia, anemia Thrombocytopenia   Anesthesia Other Findings   Reproductive/Obstetrics                              Anesthesia Physical Anesthesia Plan  ASA: 4  Anesthesia Plan: General   Post-op Pain Management: Dilaudid IV   Induction: Intravenous  PONV Risk Score and Plan: 3 and Ondansetron and Treatment may vary due to age or medical condition  Airway Management Planned: Oral ETT  Additional Equipment: Arterial line and CVP  Intra-op Plan:   Post-operative Plan: Post-operative intubation/ventilation  Informed Consent: I have reviewed the patients History and Physical, chart, labs and discussed the procedure including the risks, benefits and alternatives for the proposed anesthesia with the patient or authorized representative who has indicated his/her understanding and acceptance.     History available from chart only  Plan Discussed with: CRNA and Anesthesiologist  Anesthesia Plan Comments:          Anesthesia Quick Evaluation
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
## 2023-04-29 NOTE — Progress Notes (Signed)
   Providing Compassionate, Quality Care - Together  NEUROSURGERY PROGRESS NOTE   S: No issues overnight.   O: EXAM:  BP 127/63   Pulse 62   Temp 98.3 F (36.8 C) (Axillary)   Resp 16   Ht 6' (1.829 m)   Wt 111 kg   SpO2 99%   BMI 33.19 kg/m   Intubated, sedated Eyes open to pain PERRL Ex fix RLE Wrapped LUE Purposeful loc in RUE  ASSESSMENT:  73 y.o. male with   Bilateral subdural hematomas, contusions, subarachnoid hemorrhage, skull fractures   PLAN: -Continue supportive care -Okay for DVT prophylaxis, prefer subcu heparin -Keppra for seizure prophylaxis -neuro stable thus far  Thank you for allowing me to participate in this patient's care.  Please do not hesitate to call with questions or concerns.   Monia Pouch, DO Neurosurgeon [facility] [phone]

## 2023-04-29 NOTE — Anesthesia Postprocedure Evaluation (Signed)
Anesthesia Post Note  Patient: Paul Mitchell  Procedure(s) Performed: IRRIGATION AND DEBRIDEMENT LEFT UPPER EXTREMITY (Left: Arm Lower) IRRIGATION AND DEBRIDEMENT RIGHT LOWER EXTREMITY (Right: Leg Lower) PLACEMENT OF ANTIBIOTIC SPACER RIGHT LOWER LEG (Right: Leg Lower) REVISION OF EXTERNAL FIXATION LEG (Right: Leg Lower)     Patient location during evaluation: PACU Anesthesia Type: General Level of consciousness: awake and alert and oriented Pain management: pain level controlled Vital Signs Assessment: post-procedure vital signs reviewed and stable Respiratory status: spontaneous breathing, nonlabored ventilation and respiratory function stable Cardiovascular status: blood pressure returned to baseline and stable Postop Assessment: no apparent nausea or vomiting Anesthetic complications: no   No notable events documented.  Last Vitals:  Vitals:   04/29/23 1616 04/29/23 1617  BP:    Pulse: 72   Resp: 10   Temp:    SpO2: 93% 93%    Last Pain:  Vitals:   04/29/23 1114  TempSrc: Axillary                 Bonita Brindisi A.
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
## 2023-04-30 ENCOUNTER — Other Ambulatory Visit: Payer: Self-pay

## 2023-04-30 ENCOUNTER — Inpatient Hospital Stay (HOSPITAL_COMMUNITY): Payer: Managed Care, Other (non HMO)

## 2023-04-30 ENCOUNTER — Encounter (HOSPITAL_COMMUNITY): Payer: Self-pay | Admitting: Orthopedic Surgery

## 2023-04-30 LAB — BASIC METABOLIC PANEL
Anion gap: 9 (ref 5–15)
BUN: 44 mg/dL — ABNORMAL HIGH (ref 8–23)
CO2: 22 mmol/L (ref 22–32)
Calcium: 6.9 mg/dL — ABNORMAL LOW (ref 8.9–10.3)
Chloride: 106 mmol/L (ref 98–111)
Creatinine, Ser: 3.48 mg/dL — ABNORMAL HIGH (ref 0.61–1.24)
GFR, Estimated: 18 mL/min — ABNORMAL LOW (ref >60)
Glucose, Bld: 125 mg/dL — ABNORMAL HIGH (ref 70–99)
Potassium: 4.6 mmol/L (ref 3.5–5.1)
Sodium: 137 mmol/L (ref 135–145)

## 2023-04-30 LAB — TYPE AND SCREEN
ABO/RH(D): A NEG
Antibody Screen: NEGATIVE
Unit division: 0
Unit division: 0
Unit division: 0
Unit division: 0
Unit division: 0
Unit division: 0
Unit division: 0
Unit division: 0
Unit division: 0
Unit division: 0
Unit division: 0
Unit division: 0
Unit division: 0
Unit division: 0
Unit division: 0
Unit division: 0
Unit division: 0
Unit division: 0

## 2023-04-30 LAB — CBC
HCT: 29.6 % — ABNORMAL LOW (ref 39.0–52.0)
Hemoglobin: 9.8 g/dL — ABNORMAL LOW (ref 13.0–17.0)
MCH: 30.2 pg (ref 26.0–34.0)
MCHC: 33.1 g/dL (ref 30.0–36.0)
MCV: 91.1 fL (ref 80.0–100.0)
Platelets: 89 10*3/uL — ABNORMAL LOW (ref 150–400)
RBC: 3.25 MIL/uL — ABNORMAL LOW (ref 4.22–5.81)
RDW: 16.8 % — ABNORMAL HIGH (ref 11.5–15.5)
WBC: 10.4 10*3/uL (ref 4.0–10.5)
nRBC: 0 % (ref 0.0–0.2)

## 2023-04-30 LAB — BPAM RBC
Blood Product Expiration Date: 202502122359
Blood Product Expiration Date: 202502122359
Blood Product Expiration Date: 202503032359
Blood Product Expiration Date: 202503032359
Blood Product Expiration Date: 202503032359
Blood Product Expiration Date: 202503032359
Blood Product Expiration Date: 202503032359
Blood Product Expiration Date: 202503032359
Blood Product Expiration Date: 202503032359
Blood Product Expiration Date: 202503032359
Blood Product Expiration Date: 202503032359
Blood Product Expiration Date: 202503032359
Blood Product Expiration Date: 202503032359
Blood Product Expiration Date: 202503032359
Blood Product Expiration Date: 202503042359
Blood Product Expiration Date: 202503042359
Blood Product Expiration Date: 202503052359
Blood Product Expiration Date: 202503052359
ISSUE DATE / TIME: 202501311229
ISSUE DATE / TIME: 202501311229
ISSUE DATE / TIME: 202501311241
ISSUE DATE / TIME: 202501311241
ISSUE DATE / TIME: 202501311241
ISSUE DATE / TIME: 202501311241
ISSUE DATE / TIME: 202501311245
ISSUE DATE / TIME: 202501311245
ISSUE DATE / TIME: 202501311245
ISSUE DATE / TIME: 202501311245
ISSUE DATE / TIME: 202501311358
ISSUE DATE / TIME: 202501311358
ISSUE DATE / TIME: 202501311358
ISSUE DATE / TIME: 202501311358
ISSUE DATE / TIME: 202502021329
ISSUE DATE / TIME: 202502021656
ISSUE DATE / TIME: 202502030036
ISSUE DATE / TIME: 202502031408
Unit Type and Rh: 5100
Unit Type and Rh: 5100
Unit Type and Rh: 5100
Unit Type and Rh: 5100
Unit Type and Rh: 5100
Unit Type and Rh: 5100
Unit Type and Rh: 5100
Unit Type and Rh: 5100
Unit Type and Rh: 5100
Unit Type and Rh: 5100
Unit Type and Rh: 5100
Unit Type and Rh: 5100
Unit Type and Rh: 5100
Unit Type and Rh: 5100
Unit Type and Rh: 5100
Unit Type and Rh: 5100
Unit Type and Rh: 5100
Unit Type and Rh: 5100

## 2023-04-30 LAB — GLUCOSE, CAPILLARY
Glucose-Capillary: 117 mg/dL — ABNORMAL HIGH (ref 70–99)
Glucose-Capillary: 118 mg/dL — ABNORMAL HIGH (ref 70–99)
Glucose-Capillary: 121 mg/dL — ABNORMAL HIGH (ref 70–99)
Glucose-Capillary: 130 mg/dL — ABNORMAL HIGH (ref 70–99)
Glucose-Capillary: 138 mg/dL — ABNORMAL HIGH (ref 70–99)
Glucose-Capillary: 149 mg/dL — ABNORMAL HIGH (ref 70–99)

## 2023-04-30 LAB — RENAL FUNCTION PANEL
Albumin: 2 g/dL — ABNORMAL LOW (ref 3.5–5.0)
Albumin: 1.9 g/dL — ABNORMAL LOW (ref 3.5–5.0)
Anion gap: 9 (ref 5–15)
Anion gap: 10 (ref 5–15)
BUN: 45 mg/dL — ABNORMAL HIGH (ref 8–23)
BUN: 41 mg/dL — ABNORMAL HIGH (ref 8–23)
CO2: 24 mmol/L (ref 22–32)
CO2: 23 mmol/L (ref 22–32)
Calcium: 7.2 mg/dL — ABNORMAL LOW (ref 8.9–10.3)
Calcium: 7.3 mg/dL — ABNORMAL LOW (ref 8.9–10.3)
Chloride: 105 mmol/L (ref 98–111)
Chloride: 104 mmol/L (ref 98–111)
Creatinine, Ser: 3.54 mg/dL — ABNORMAL HIGH (ref 0.61–1.24)
Creatinine, Ser: 3.14 mg/dL — ABNORMAL HIGH (ref 0.61–1.24)
GFR, Estimated: 18 mL/min — ABNORMAL LOW (ref >60)
GFR, Estimated: 21 mL/min — ABNORMAL LOW (ref >60)
Glucose, Bld: 128 mg/dL — ABNORMAL HIGH (ref 70–99)
Glucose, Bld: 138 mg/dL — ABNORMAL HIGH (ref 70–99)
Phosphorus: 4.1 mg/dL (ref 2.5–4.6)
Phosphorus: 3.4 mg/dL (ref 2.5–4.6)
Potassium: 4.8 mmol/L (ref 3.5–5.1)
Potassium: 4.6 mmol/L (ref 3.5–5.1)
Sodium: 138 mmol/L (ref 135–145)
Sodium: 137 mmol/L (ref 135–145)

## 2023-04-30 LAB — TRIGLYCERIDES: Triglycerides: 76 mg/dL (ref <150)

## 2023-04-30 LAB — MAGNESIUM: Magnesium: 2.5 mg/dL — ABNORMAL HIGH (ref 1.7–2.4)

## 2023-04-30 MED ORDER — SODIUM CHLORIDE 0.9 % IV SOLN
3.0000 g | Freq: Once | INTRAVENOUS | Status: DC
  Filled 2023-04-30: qty 30

## 2023-04-30 MED ORDER — CALCIUM GLUCONATE-NACL 2-0.675 GM/100ML-% IV SOLN
2.0000 g | Freq: Once | INTRAVENOUS | Status: AC
  Administered 2023-04-30: 2000 mg via INTRAVENOUS
  Filled 2023-04-30: qty 100

## 2023-04-30 NOTE — Progress Notes (Signed)
 Patient ID: Paul Mitchell, male   DOB: [birth year], 73 y.o.   MRN: [id number] Weaning well this PM. I updated his wife at the bedside. His Workers Comp sports coach was also there.  Dann Hummer, MD, MPH, FACS Please use [email or web address] to contact on call provider

## 2023-04-30 NOTE — TOC Progression Note (Signed)
 Transition of Care [facility]) - Progression Note    Patient Details  Name: Paul Mitchell MRN: [id number] Date of Birth: 02-08-1951  Transition of Care [facility]) CM/SW Contact  Shamia Uppal, Mliss HERO, RN Phone Number: 04/30/2023, 3:07 PM  Clinical Narrative:    Faxed updated clinical information to Glendale Manas, RN, BSN, Field Case Manager for [employer].  Patient remains intubated on CRRT; anticipate return to OR tomorrow for further evaluation of right leg. Will follow closely as patient progresses.    Expected Discharge Plan: IP Rehab Facility Barriers to Discharge: Continued Medical Work up  Expected Discharge Plan and Services   Discharge Planning Services: CM Consult   Living arrangements for the past 2 months: Single Family Home                                       Social Determinants of Health (SDOH) Interventions    Readmission Risk Interventions     No data to display         Mliss MICAEL Fass, RN, BSN  Trauma/Neuro ICU Case Manager [phone]

## 2023-04-30 NOTE — Procedures (Signed)
 Patient seen on CRRT and chart/data reviewed. UF goal limited by CRRT downtime while in OR. Restarted with net UF 144mL/hr and tolerating this without problems. Continue CRRT at current prescription with anuric AKI.   Gordy Blanch MD [facility]. Office # [phone] Pager # [phone] 8:52 AM
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
## 2023-05-01 ENCOUNTER — Inpatient Hospital Stay (HOSPITAL_COMMUNITY): Payer: Managed Care, Other (non HMO)

## 2023-05-01 ENCOUNTER — Encounter (HOSPITAL_COMMUNITY): Payer: Self-pay | Admitting: Orthopedic Surgery

## 2023-05-01 ENCOUNTER — Other Ambulatory Visit: Payer: Self-pay

## 2023-05-01 LAB — RENAL FUNCTION PANEL
Albumin: 2 g/dL — ABNORMAL LOW (ref 3.5–5.0)
Albumin: 1.9 g/dL — ABNORMAL LOW (ref 3.5–5.0)
Anion gap: 10 (ref 5–15)
Anion gap: 10 (ref 5–15)
BUN: 48 mg/dL — ABNORMAL HIGH (ref 8–23)
BUN: 49 mg/dL — ABNORMAL HIGH (ref 8–23)
CO2: 24 mmol/L (ref 22–32)
CO2: 25 mmol/L (ref 22–32)
Calcium: 7.6 mg/dL — ABNORMAL LOW (ref 8.9–10.3)
Calcium: 7.3 mg/dL — ABNORMAL LOW (ref 8.9–10.3)
Chloride: 102 mmol/L (ref 98–111)
Chloride: 103 mmol/L (ref 98–111)
Creatinine, Ser: 3.04 mg/dL — ABNORMAL HIGH (ref 0.61–1.24)
Creatinine, Ser: 3.07 mg/dL — ABNORMAL HIGH (ref 0.61–1.24)
GFR, Estimated: 21 mL/min — ABNORMAL LOW (ref >60)
GFR, Estimated: 21 mL/min — ABNORMAL LOW (ref >60)
Glucose, Bld: 145 mg/dL — ABNORMAL HIGH (ref 70–99)
Glucose, Bld: 125 mg/dL — ABNORMAL HIGH (ref 70–99)
Phosphorus: 3.3 mg/dL (ref 2.5–4.6)
Phosphorus: 2.9 mg/dL (ref 2.5–4.6)
Potassium: 5.2 mmol/L — ABNORMAL HIGH (ref 3.5–5.1)
Potassium: 4.8 mmol/L (ref 3.5–5.1)
Sodium: 136 mmol/L (ref 135–145)
Sodium: 138 mmol/L (ref 135–145)

## 2023-05-01 LAB — POCT I-STAT 7, (LYTES, BLD GAS, ICA,H+H)
Acid-Base Excess: 0 mmol/L (ref 0.0–2.0)
Bicarbonate: 25.9 mmol/L (ref 20.0–28.0)
Calcium, Ion: 1.06 mmol/L — ABNORMAL LOW (ref 1.15–1.40)
HCT: 29 % — ABNORMAL LOW (ref 39.0–52.0)
Hemoglobin: 9.9 g/dL — ABNORMAL LOW (ref 13.0–17.0)
O2 Saturation: 95 %
Patient temperature: 98.3
Potassium: 5 mmol/L (ref 3.5–5.1)
Sodium: 135 mmol/L (ref 135–145)
TCO2: 27 mmol/L (ref 22–32)
pCO2 arterial: 44.4 mmHg (ref 32–48)
pH, Arterial: 7.372 (ref 7.35–7.45)
pO2, Arterial: 78 mmHg — ABNORMAL LOW (ref 83–108)

## 2023-05-01 LAB — CBC
HCT: 31.6 % — ABNORMAL LOW (ref 39.0–52.0)
Hemoglobin: 10.2 g/dL — ABNORMAL LOW (ref 13.0–17.0)
MCH: 29.7 pg (ref 26.0–34.0)
MCHC: 32.3 g/dL (ref 30.0–36.0)
MCV: 91.9 fL (ref 80.0–100.0)
Platelets: 92 10*3/uL — ABNORMAL LOW (ref 150–400)
RBC: 3.44 MIL/uL — ABNORMAL LOW (ref 4.22–5.81)
RDW: 17.1 % — ABNORMAL HIGH (ref 11.5–15.5)
WBC: 9.8 10*3/uL (ref 4.0–10.5)
nRBC: 0 % (ref 0.0–0.2)

## 2023-05-01 LAB — GLUCOSE, CAPILLARY
Glucose-Capillary: 112 mg/dL — ABNORMAL HIGH (ref 70–99)
Glucose-Capillary: 120 mg/dL — ABNORMAL HIGH (ref 70–99)
Glucose-Capillary: 103 mg/dL — ABNORMAL HIGH (ref 70–99)
Glucose-Capillary: 106 mg/dL — ABNORMAL HIGH (ref 70–99)
Glucose-Capillary: 128 mg/dL — ABNORMAL HIGH (ref 70–99)

## 2023-05-01 LAB — MAGNESIUM: Magnesium: 2.6 mg/dL — ABNORMAL HIGH (ref 1.7–2.4)

## 2023-05-01 LAB — AMMONIA: Ammonia: 28 umol/L (ref 9–35)

## 2023-05-01 LAB — HEPARIN LEVEL (UNFRACTIONATED): Heparin Unfractionated: 0.93 [IU]/mL — ABNORMAL HIGH (ref 0.30–0.70)

## 2023-05-01 MED ORDER — SODIUM ZIRCONIUM CYCLOSILICATE 10 G PO PACK
10.0000 g | PACK | Freq: Once | ORAL | Status: AC
Start: 2023-05-01 — End: 2023-05-01
  Administered 2023-05-01: 10 g
  Filled 2023-05-01: qty 1

## 2023-05-01 MED ORDER — POLYETHYLENE GLYCOL 3350 17 G PO PACK: 17.0000 g | PACK | Freq: Every day | ORAL | Status: DC

## 2023-05-01 MED ORDER — SODIUM CHLORIDE 0.9% FLUSH
10.0000 mL | Freq: Two times a day (BID) | INTRAVENOUS | Status: DC
  Administered 2023-05-02: 30 mL
  Administered 2023-05-02 – 2023-05-03 (×2): 20 mL
  Administered 2023-05-04: 30 mL
  Administered 2023-05-04: 20 mL
  Administered 2023-05-05: 10 mL
  Administered 2023-05-06: 30 mL

## 2023-05-01 MED ORDER — PRISMASOL BGK 0/2.5 32-2.5 MEQ/L EC SOLN
Status: DC
  Filled 2023-05-01 (×3): qty 5000

## 2023-05-01 MED ORDER — SODIUM CHLORIDE 0.9 % IV SOLN
INTRAVENOUS | Status: AC | PRN
  Administered 2023-05-01 (×2): 5 mL via INTRAVENOUS

## 2023-05-01 MED ORDER — HEPARIN (PORCINE) 25000 UT/250ML-% IV SOLN
1500.0000 [IU]/h | INTRAVENOUS | Status: DC
  Administered 2023-05-01: 1500 [IU]/h via INTRAVENOUS
  Filled 2023-05-01: qty 250

## 2023-05-01 MED ORDER — SODIUM CHLORIDE 0.9% FLUSH: 10.0000 mL | INTRAVENOUS | Status: DC | PRN

## 2023-05-01 MED ORDER — SENNA 8.6 MG PO TABS
1.0000 | ORAL_TABLET | Freq: Every day | ORAL | Status: DC
  Administered 2023-05-01: 8.6 mg
  Filled 2023-05-01: qty 1

## 2023-05-01 MED ORDER — HEPARIN (PORCINE) 25000 UT/250ML-% IV SOLN: 1150.0000 [IU]/h | INTRAVENOUS | Status: DC
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
## 2023-05-01 NOTE — TOC Progression Note (Signed)
 Transition of Care [facility]) - Progression Note    Patient Details  Name: Paul Mitchell MRN: [id number] Date of Birth: [birth year]  Transition of Care [facility]) CM/SW Contact  Azyah Flett, Mliss HERO, RN Phone Number: 05/01/2023, 3:17 PM  Clinical Narrative:    Updated clinical information faxed to Glendale Manas, field case manager for Iva Sinner, patient's [facility] agency.  Fax [phone]   Expected Discharge Plan: IP Rehab Facility Barriers to Discharge: Continued Medical Work up  Expected Discharge Plan and Services   Discharge Planning Services: CM Consult   Living arrangements for the past 2 months: Single Family Home                                       Social Determinants of Health (SDOH) Interventions    Readmission Risk Interventions     No data to display         Mliss MICAEL Fass, RN, BSN  Trauma/Neuro ICU Case Manager [phone]

## 2023-05-01 NOTE — Progress Notes (Signed)
 Orthopedic Tech Progress Note Patient Details:  Paul Mitchell January 18, 1951 [id number]  Ortho Devices Type of Ortho Device: Prafo boot/shoe Ortho Device/Splint Location: LLE Ortho Device/Splint Interventions: Application   Post Interventions Patient Tolerated: Well  Paul Mitchell 05/01/2023, 2:05 PM
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
## 2023-05-01 NOTE — Progress Notes (Signed)
Peripherally Inserted Central Catheter Placement  The IV Nurse has discussed with the patient and/or persons authorized to consent for the patient, the purpose of this procedure and the potential benefits and risks involved with this procedure.  The benefits include less needle sticks, lab draws from the catheter, and the patient may be discharged home with the catheter. Risks include, but not limited to, infection, bleeding, blood clot (thrombus formation), and puncture of an artery; nerve damage and irregular heartbeat and possibility to perform a PICC exchange if needed/ordered by physician.  Alternatives to this procedure were also discussed.  Bard Power PICC patient education guide, fact sheet on infection prevention and patient information card has been provided to patient /or left at bedside.    PICC Placement Documentation  PICC Triple Lumen 05/01/23 Right Basilic 42 cm 0 cm (Active)  Indication for Insertion or Continuance of Line Vasoactive infusions 05/01/23 2332  Exposed Catheter (cm) 0 cm 05/01/23 2332  Site Assessment Clean, Dry, Intact 05/01/23 2332  Lumen #1 Status Blood return noted;Flushed;Saline locked 05/01/23 2332  Lumen #2 Status Blood return noted;Flushed;Saline locked 05/01/23 2332  Lumen #3 Status Blood return noted;Flushed;Saline locked 05/01/23 2332  Dressing Type Transparent;Securing device 05/01/23 2332  Dressing Status Antimicrobial disc/dressing in place 05/01/23 2332  Line Care Connections checked and tightened 05/01/23 2332  Line Adjustment (NICU/IV Team Only) No 05/01/23 2332  Dressing Intervention New dressing;Adhesive placed at insertion site (IV team only) 05/01/23 2332  Dressing Change Due 05/08/23 05/01/23 2332       Christeen Douglas 05/01/2023, 11:47 PM

## 2023-05-01 NOTE — Progress Notes (Signed)
CT reviewed with Dr. Margo Aye and case d/w Dr. Jake Samples. Will plan to start low dose heparin gtt, no boluses, and repeat CT head when therapeutic.   Diamantina Monks, MD General and Trauma Surgery [facility]
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
## 2023-05-01 NOTE — Progress Notes (Signed)
Consent for PICC insertion obtained from spouse.

## 2023-05-01 NOTE — Progress Notes (Signed)
RT assisted with transport of this pt from 4N to CT and back while on full ventilatory support. Pt tolerated well with SVS no complications.

## 2023-05-02 ENCOUNTER — Encounter (HOSPITAL_COMMUNITY): Payer: Self-pay

## 2023-05-02 ENCOUNTER — Inpatient Hospital Stay (HOSPITAL_COMMUNITY): Payer: Managed Care, Other (non HMO) | Admitting: Certified Registered"

## 2023-05-02 ENCOUNTER — Other Ambulatory Visit: Payer: Self-pay

## 2023-05-02 ENCOUNTER — Encounter (HOSPITAL_COMMUNITY): Admission: EM | Disposition: E | Payer: Self-pay | Source: Home / Self Care

## 2023-05-02 ENCOUNTER — Inpatient Hospital Stay (HOSPITAL_COMMUNITY): Payer: Managed Care, Other (non HMO)

## 2023-05-02 DIAGNOSIS — S82401C Unspecified fracture of shaft of right fibula, initial encounter for open fracture type IIIA, IIIB, or IIIC: Secondary | ICD-10-CM | POA: Diagnosis not present

## 2023-05-02 DIAGNOSIS — S41102A Unspecified open wound of left upper arm, initial encounter: Secondary | ICD-10-CM

## 2023-05-02 DIAGNOSIS — S82201C Unspecified fracture of shaft of right tibia, initial encounter for open fracture type IIIA, IIIB, or IIIC: Secondary | ICD-10-CM | POA: Diagnosis not present

## 2023-05-02 HISTORY — PX: TIBIA IM NAIL INSERTION: SHX2516

## 2023-05-02 HISTORY — PX: INCISION AND DRAINAGE OF WOUND: SHX1803

## 2023-05-02 HISTORY — PX: I & D EXTREMITY: SHX5045

## 2023-05-02 LAB — GLUCOSE, CAPILLARY
Glucose-Capillary: 106 mg/dL — ABNORMAL HIGH (ref 70–99)
Glucose-Capillary: 109 mg/dL — ABNORMAL HIGH (ref 70–99)
Glucose-Capillary: 109 mg/dL — ABNORMAL HIGH (ref 70–99)
Glucose-Capillary: 124 mg/dL — ABNORMAL HIGH (ref 70–99)
Glucose-Capillary: 145 mg/dL — ABNORMAL HIGH (ref 70–99)
Glucose-Capillary: 145 mg/dL — ABNORMAL HIGH (ref 70–99)
Glucose-Capillary: 93 mg/dL (ref 70–99)

## 2023-05-02 LAB — RENAL FUNCTION PANEL
Albumin: 1.8 g/dL — ABNORMAL LOW (ref 3.5–5.0)
Albumin: 1.8 g/dL — ABNORMAL LOW (ref 3.5–5.0)
Anion gap: 11 (ref 5–15)
Anion gap: 11 (ref 5–15)
BUN: 54 mg/dL — ABNORMAL HIGH (ref 8–23)
BUN: 60 mg/dL — ABNORMAL HIGH (ref 8–23)
CO2: 25 mmol/L (ref 22–32)
CO2: 23 mmol/L (ref 22–32)
Calcium: 7.7 mg/dL — ABNORMAL LOW (ref 8.9–10.3)
Calcium: 7.6 mg/dL — ABNORMAL LOW (ref 8.9–10.3)
Chloride: 101 mmol/L (ref 98–111)
Chloride: 102 mmol/L (ref 98–111)
Creatinine, Ser: 2.9 mg/dL — ABNORMAL HIGH (ref 0.61–1.24)
Creatinine, Ser: 3.34 mg/dL — ABNORMAL HIGH (ref 0.61–1.24)
GFR, Estimated: 23 mL/min — ABNORMAL LOW (ref >60)
GFR, Estimated: 19 mL/min — ABNORMAL LOW (ref >60)
Glucose, Bld: 120 mg/dL — ABNORMAL HIGH (ref 70–99)
Glucose, Bld: 122 mg/dL — ABNORMAL HIGH (ref 70–99)
Phosphorus: 3 mg/dL (ref 2.5–4.6)
Phosphorus: 4.9 mg/dL — ABNORMAL HIGH (ref 2.5–4.6)
Potassium: 5.1 mmol/L (ref 3.5–5.1)
Potassium: 5.2 mmol/L — ABNORMAL HIGH (ref 3.5–5.1)
Sodium: 137 mmol/L (ref 135–145)
Sodium: 136 mmol/L (ref 135–145)

## 2023-05-02 LAB — CBC
HCT: 31 % — ABNORMAL LOW (ref 39.0–52.0)
Hemoglobin: 9.7 g/dL — ABNORMAL LOW (ref 13.0–17.0)
MCH: 29.1 pg (ref 26.0–34.0)
MCHC: 31.3 g/dL (ref 30.0–36.0)
MCV: 93.1 fL (ref 80.0–100.0)
Platelets: 109 10*3/uL — ABNORMAL LOW (ref 150–400)
RBC: 3.33 MIL/uL — ABNORMAL LOW (ref 4.22–5.81)
RDW: 17 % — ABNORMAL HIGH (ref 11.5–15.5)
WBC: 9 10*3/uL (ref 4.0–10.5)
nRBC: 0 % (ref 0.0–0.2)

## 2023-05-02 LAB — HEPARIN LEVEL (UNFRACTIONATED): Heparin Unfractionated: 0.8 [IU]/mL — ABNORMAL HIGH (ref 0.30–0.70)

## 2023-05-02 LAB — MAGNESIUM: Magnesium: 2.7 mg/dL — ABNORMAL HIGH (ref 1.7–2.4)

## 2023-05-02 LAB — SURGICAL PCR SCREEN
MRSA, PCR: NEGATIVE
Staphylococcus aureus: POSITIVE — AB

## 2023-05-02 SURGERY — IRRIGATION AND DEBRIDEMENT WOUND
Anesthesia: General | Site: Leg Lower | Laterality: Right

## 2023-05-02 MED ORDER — PRISMASOL BGK 0/2.5 32-2.5 MEQ/L EC SOLN
INTRAVENOUS | Status: DC
Start: 2023-05-02 — End: 2023-05-02

## 2023-05-02 MED ORDER — MUPIROCIN 2 % EX OINT
1.0000 | TOPICAL_OINTMENT | Freq: Two times a day (BID) | CUTANEOUS | Status: DC
  Administered 2023-05-02 – 2023-05-06 (×8): 1 via NASAL
  Filled 2023-05-02: qty 22

## 2023-05-02 MED ORDER — VANCOMYCIN HCL 1000 MG IV SOLR
INTRAVENOUS | Status: AC
  Filled 2023-05-02: qty 20

## 2023-05-02 MED ORDER — ROCURONIUM BROMIDE 10 MG/ML (PF) SYRINGE
PREFILLED_SYRINGE | INTRAVENOUS | Status: AC
  Filled 2023-05-02: qty 10

## 2023-05-02 MED ORDER — ROCURONIUM BROMIDE 10 MG/ML (PF) SYRINGE
PREFILLED_SYRINGE | INTRAVENOUS | Status: DC | PRN
  Administered 2023-05-02 (×2): 50 mg via INTRAVENOUS

## 2023-05-02 MED ORDER — SODIUM CHLORIDE 0.9 % IV SOLN
2.0000 g | INTRAVENOUS | Status: AC
  Administered 2023-05-03 – 2023-05-05 (×3): 2 g via INTRAVENOUS
  Filled 2023-05-02 (×3): qty 20

## 2023-05-02 MED ORDER — PRISMASOL BGK 2/3.5 32-2-3.5 MEQ/L EC SOLN: Status: DC

## 2023-05-02 MED ORDER — VASHE WOUND IRRIGATION OPTIME
TOPICAL | Status: DC | PRN
  Administered 2023-05-02: 34 [oz_av]

## 2023-05-02 MED ORDER — CEFAZOLIN SODIUM-DEXTROSE 2-4 GM/100ML-% IV SOLN
2.0000 g | INTRAVENOUS | Status: AC
  Administered 2023-05-02: 2 g via INTRAVENOUS
  Filled 2023-05-02: qty 100

## 2023-05-02 MED ORDER — METHYLENE BLUE (ANTIDOTE) 1 % IV SOLN
INTRAVENOUS | Status: AC
  Filled 2023-05-02: qty 10

## 2023-05-02 MED ORDER — FENTANYL CITRATE (PF) 250 MCG/5ML IJ SOLN
INTRAMUSCULAR | Status: DC | PRN
  Administered 2023-05-02: 100 ug via INTRAVENOUS
  Administered 2023-05-02 (×3): 50 ug via INTRAVENOUS

## 2023-05-02 MED ORDER — CHLORHEXIDINE GLUCONATE CLOTH 2 % EX PADS
6.0000 | MEDICATED_PAD | Freq: Once | CUTANEOUS | Status: AC
  Administered 2023-05-02: 6 via TOPICAL

## 2023-05-02 MED ORDER — TOBRAMYCIN SULFATE 80 MG/2ML IJ SOLN
INTRAMUSCULAR | Status: AC
  Filled 2023-05-02: qty 2

## 2023-05-02 MED ORDER — HYDROMORPHONE HCL 1 MG/ML IJ SOLN
INTRAMUSCULAR | Status: DC | PRN
  Administered 2023-05-02: .5 mg via INTRAVENOUS

## 2023-05-02 MED ORDER — TOBRAMYCIN SULFATE 1.2 G IJ SOLR
INTRAMUSCULAR | Status: DC | PRN
  Administered 2023-05-02: 1.2 g

## 2023-05-02 MED ORDER — HYDROMORPHONE HCL 1 MG/ML IJ SOLN
INTRAMUSCULAR | Status: AC
  Filled 2023-05-02: qty 0.5

## 2023-05-02 MED ORDER — LACTATED RINGERS IV SOLN: INTRAVENOUS | Status: DC | PRN

## 2023-05-02 MED ORDER — FENTANYL CITRATE (PF) 250 MCG/5ML IJ SOLN
INTRAMUSCULAR | Status: AC
  Filled 2023-05-02: qty 5

## 2023-05-02 MED ORDER — CHLORHEXIDINE GLUCONATE CLOTH 2 % EX PADS: 6.0000 | MEDICATED_PAD | Freq: Once | CUTANEOUS | Status: AC

## 2023-05-02 MED ORDER — HEPARIN (PORCINE) 25000 UT/250ML-% IV SOLN
750.0000 [IU]/h | INTRAVENOUS | Status: DC
  Administered 2023-05-02 (×2): 1050 [IU]/h via INTRAVENOUS
  Administered 2023-05-03: 800 [IU]/h via INTRAVENOUS
  Administered 2023-05-05: 750 [IU]/h via INTRAVENOUS
  Filled 2023-05-02 (×3): qty 250

## 2023-05-02 MED ORDER — METHYLENE BLUE (ANTIDOTE) 1 % IV SOLN
INTRAVENOUS | Status: DC | PRN
  Administered 2023-05-02: .5 mL

## 2023-05-02 MED ORDER — TOBRAMYCIN SULFATE 1.2 G IJ SOLR
INTRAMUSCULAR | Status: AC
  Filled 2023-05-02: qty 1.2

## 2023-05-02 MED ORDER — 0.9 % SODIUM CHLORIDE (POUR BTL) OPTIME
TOPICAL | Status: DC | PRN
  Administered 2023-05-02: 1000 mL

## 2023-05-02 MED ORDER — PRISMASOL BGK 0/2.5 32-2.5 MEQ/L EC SOLN: Status: DC

## 2023-05-02 MED ORDER — VANCOMYCIN HCL 1000 MG IV SOLR
INTRAVENOUS | Status: DC | PRN
  Administered 2023-05-02: 1000 mg

## 2023-05-02 SURGICAL SUPPLY — 73 items
BAG COUNTER SPONGE SURGICOUNT (BAG) IMPLANT
BIT DRILL LONG 4.2 (BIT) IMPLANT
BIT DRILL SHORT 3.2MM (DRILL) IMPLANT
BNDG COHESIVE 4X5 TAN STRL (GAUZE/BANDAGES/DRESSINGS) ×3 IMPLANT
BNDG ELASTIC 4INX 5YD STR LF (GAUZE/BANDAGES/DRESSINGS) IMPLANT
BNDG ELASTIC 6INX 5YD STR LF (GAUZE/BANDAGES/DRESSINGS) IMPLANT
BNDG GAUZE DERMACEA FLUFF 4 (GAUZE/BANDAGES/DRESSINGS) ×6 IMPLANT
BRUSH SCRUB EZ PLAIN DRY (MISCELLANEOUS) ×6 IMPLANT
CANISTER WOUNDNEG PRESSURE 500 (CANNISTER) IMPLANT
CEMENT BONE SIMPLEX SPEEDSET (Cement) IMPLANT
CLEANSER WND VASHE 34 (WOUND CARE) IMPLANT
CONNECTOR Y WND VAC (MISCELLANEOUS) IMPLANT
COVER SURGICAL LIGHT HANDLE (MISCELLANEOUS) ×6 IMPLANT
DRAPE U-SHAPE 47X51 STRL (DRAPES) ×3 IMPLANT
DRESSING MEPILEX FLEX 4X4 (GAUZE/BANDAGES/DRESSINGS) IMPLANT
DRILL SHORT 3.2MM (DRILL) ×6
DRSG ADAPTIC 3X8 NADH LF (GAUZE/BANDAGES/DRESSINGS) ×3 IMPLANT
DRSG CUTIMED SORBACT 7X9 (GAUZE/BANDAGES/DRESSINGS) IMPLANT
DRSG MEPILEX FLEX 4X4 (GAUZE/BANDAGES/DRESSINGS) ×9
DRSG VAC GRANUFOAM LG (GAUZE/BANDAGES/DRESSINGS) IMPLANT
DRSG VAC GRANUFOAM MED (GAUZE/BANDAGES/DRESSINGS) IMPLANT
DRSG VAC GRANUFOAM SM (GAUZE/BANDAGES/DRESSINGS) IMPLANT
ELECT REM PT RETURN 9FT ADLT (ELECTROSURGICAL)
ELECTRODE REM PT RTRN 9FT ADLT (ELECTROSURGICAL) IMPLANT
GAUZE PAD ABD 8X10 STRL (GAUZE/BANDAGES/DRESSINGS) IMPLANT
GAUZE SPONGE 4X4 12PLY STRL (GAUZE/BANDAGES/DRESSINGS) ×3 IMPLANT
GFT MATRIX 2 LAYER 10X10 (Graft) ×6 IMPLANT
GLOVE BIO SURGEON STRL SZ7.5 (GLOVE) ×3 IMPLANT
GLOVE BIO SURGEON STRL SZ8 (GLOVE) ×3 IMPLANT
GLOVE BIOGEL PI IND STRL 7.5 (GLOVE) ×3 IMPLANT
GLOVE BIOGEL PI IND STRL 8 (GLOVE) ×3 IMPLANT
GLOVE BIOGEL PI IND STRL 9 (GLOVE) ×3 IMPLANT
GLOVE SURG ORTHO LTX SZ7.5 (GLOVE) ×6 IMPLANT
GOWN STRL REUS W/ TWL LRG LVL3 (GOWN DISPOSABLE) ×6 IMPLANT
GOWN STRL REUS W/ TWL XL LVL3 (GOWN DISPOSABLE) ×3 IMPLANT
GRAFT MATRIX 2 LAYER 10X10 (Graft) IMPLANT
GRAFT MYRIAD 3 LAYER 10X10 (Graft) IMPLANT
GUIDEWIRE 3.2X400 (WIRE) IMPLANT
KIT BASIN OR (CUSTOM PROCEDURE TRAY) ×3 IMPLANT
KIT TURNOVER KIT B (KITS) ×3 IMPLANT
MANIFOLD NEPTUNE II (INSTRUMENTS) ×3 IMPLANT
NAIL IM TNA 8X405 STL (Nail) IMPLANT
NS IRRIG 1000ML POUR BTL (IV SOLUTION) ×3 IMPLANT
PACK ORTHO EXTREMITY (CUSTOM PROCEDURE TRAY) ×3 IMPLANT
PAD ARMBOARD 7.5X6 YLW CONV (MISCELLANEOUS) ×6 IMPLANT
PADDING CAST COTTON 6X4 STRL (CAST SUPPLIES) ×3 IMPLANT
POWDER MORCELSS FINE 2000MG (Miscellaneous) IMPLANT
PWDR MORCELSS FINE 2000MG (Miscellaneous) ×6 IMPLANT
REAMER ROD DEEP FLUTE 2.5X950 (INSTRUMENTS) IMPLANT
SCREW LOCK IM NAIL 4X46 STL (Screw) IMPLANT
SCREW LOCK IM NAIL 4X50 STL (Screw) IMPLANT
SCREW LOCK IM NAIL 5X66 (Screw) IMPLANT
SCREW LOCK IM NAIL 5X88 NS (Screw) IMPLANT
SET HNDPC FAN SPRY TIP SCT (DISPOSABLE) IMPLANT
SOL PREP POV-IOD 4OZ 10% (MISCELLANEOUS) ×3 IMPLANT
SOL SCRUB PVP POV-IOD 4OZ 7.5% (MISCELLANEOUS) ×6
SOLUTION SCRB POV-IOD 4OZ 7.5% (MISCELLANEOUS) ×3 IMPLANT
SPLINT FIBERGLASS 3X35 (CAST SUPPLIES) IMPLANT
SPLINT FIBERGLASS 4X30 (CAST SUPPLIES) IMPLANT
SPONGE T-LAP 18X18 ~~LOC~~+RFID (SPONGE) ×3 IMPLANT
STOCKINETTE IMPERVIOUS 9X36 MD (GAUZE/BANDAGES/DRESSINGS) IMPLANT
SUT MNCRL AB 4-0 PS2 18 (SUTURE) IMPLANT
SUT PDS AB 2-0 CT1 27 (SUTURE) IMPLANT
SUT PDS AB 3-0 SH 27 (SUTURE) IMPLANT
SUT VIC AB 1 CT1 27XBRD ANBCTR (SUTURE) IMPLANT
SUT VIC AB 2-0 CT1 TAPERPNT 27 (SUTURE) IMPLANT
SUT VIC AB 5-0 PS2 18 (SUTURE) IMPLANT
TOWEL GREEN STERILE (TOWEL DISPOSABLE) ×6 IMPLANT
TOWEL GREEN STERILE FF (TOWEL DISPOSABLE) ×3 IMPLANT
TUBE CONNECTING 12X1/4 (SUCTIONS) ×3 IMPLANT
UNDERPAD 30X36 HEAVY ABSORB (UNDERPADS AND DIAPERS) ×3 IMPLANT
WATER STERILE IRR 1000ML POUR (IV SOLUTION) ×3 IMPLANT
YANKAUER SUCT BULB TIP NO VENT (SUCTIONS) ×3 IMPLANT
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
## 2023-05-02 NOTE — Anesthesia Postprocedure Evaluation (Signed)
 Anesthesia Post Note  Patient: Paul Mitchell  Procedure(s) Performed: IRRIGATION AND DEBRIDEMENT LEFT UPPER EXTREMITY (Left) IRRIGATION AND DEBRIDEMENT RIGHT LOWER EXTREMITY (Right) INTRAMEDULLARY (IM) NAIL TIBIAL (Right: Leg Lower)     Patient location during evaluation: ICU Anesthesia Type: General Level of consciousness: sedated and patient remains intubated per anesthesia plan Pain management: pain level controlled Vital Signs Assessment: post-procedure vital signs reviewed and stable Respiratory status: patient on ventilator - see flowsheet for VS Cardiovascular status: blood pressure returned to baseline and stable Postop Assessment: no apparent nausea or vomiting Anesthetic complications: no   No notable events documented.  Last Vitals:  Vitals:   05/02/23 1200 05/02/23 1600  BP:  111/62  Pulse:  71  Resp:  11  Temp: 36.8 C 36.7 C  SpO2:  100%    Last Pain:  Vitals:   05/02/23 1600  TempSrc: Axillary                 Almarie CHRISTELLA Marchi

## 2023-05-02 NOTE — Progress Notes (Signed)
 No significant change overnight. I&S.  The risks and benefits of repeat debridement of the left upper and right lower extremities with probable removal of external fixation and nailing of the right tibia were discussed with the patient's wife, including the possibility of infection, nerve injury, vessel injury, wound breakdown, arthritis, symptomatic hardware, DVT/ PE, loss of motion, malunion, nonunion, and need for further surgery among others.  We also specifically discussed the need to stage surgery because of his profound soft tissue injury and the alternative of primary amputation.  These risks were acknowledged and consent was provided to proceed.  Paul Bruch, MD [facility], [facility] [phone]

## 2023-05-02 NOTE — Transfer of Care (Signed)
 Immediate Anesthesia Transfer of Care Note  Patient: Paul Mitchell  Procedure(s) Performed: IRRIGATION AND DEBRIDEMENT LEFT UPPER EXTREMITY (Left) IRRIGATION AND DEBRIDEMENT RIGHT LOWER EXTREMITY (Right) INTRAMEDULLARY (IM) NAIL TIBIAL (Right: Leg Lower)  Patient Location: ICU  Anesthesia Type:General  Level of Consciousness: Patient remains intubated per anesthesia plan  Airway & Oxygen Therapy: Patient placed on Ventilator (see vital sign flow sheet for setting)  Post-op Assessment: Report given to RN and Post -op Vital signs reviewed and stable  Post vital signs: Reviewed and stable  Last Vitals:  Vitals Value Taken Time  BP 124/67 05/02/23 1500  Temp    Pulse 70 05/02/23 1502  Resp 16 05/02/23 1502  SpO2 100 % 05/02/23 1502  Vitals shown include unfiled device data.  Last Pain:  Vitals:   05/02/23 1200  TempSrc: Axillary         Complications: No notable events documented.

## 2023-05-02 NOTE — Interval H&P Note (Signed)
 History and Physical Interval Note:  05/02/2023 10:32 AM  Paul Mitchell  has presented today for surgery, with the diagnosis of Repeat debridement.  The various methods of treatment have been discussed with the patient and family. After consideration of risks, benefits and other options for treatment, the patient has consented to  Procedure(s): IRRIGATION AND DEBRIDEMENT LEFT UPPER EXTREMITY (Left) IRRIGATION AND DEBRIDEMENT RIGHT LOWER EXTREMITY (Right) as a surgical intervention.  The patient's history has been reviewed, patient examined, no change in status, stable for surgery.  I have reviewed the patient's chart and labs.  Questions were answered to the patient's satisfaction.     Estefana RAMAN Deniss Wormley

## 2023-05-02 NOTE — TOC Progression Note (Signed)
 Transition of Care [facility]) - Progression Note    Patient Details  Name: Paul Mitchell MRN: [id number] Date of Birth: 01-01-1951  Transition of Care [facility]) CM/SW Contact  Jamicah Anstead, Mliss HERO, RN Phone Number: 05/02/2023, 4:29 PM  Clinical Narrative:    Updated clinical information faxed to Glendale Manas, field case manager for Paul Mitchell, patient's [facility] agency. Fax [phone]    Expected Discharge Plan: IP Rehab Facility Barriers to Discharge: Continued Medical Work up  Expected Discharge Plan and Services   Discharge Planning Services: CM Consult   Living arrangements for the past 2 months: Single Family Home                                       Social Determinants of Health (SDOH) Interventions    Readmission Risk Interventions     No data to display         Mliss MICAEL Fass, RN, BSN  Trauma/Neuro ICU Case Manager [phone]

## 2023-05-03 ENCOUNTER — Inpatient Hospital Stay (HOSPITAL_COMMUNITY): Payer: Managed Care, Other (non HMO)

## 2023-05-03 ENCOUNTER — Other Ambulatory Visit: Payer: Self-pay

## 2023-05-03 DIAGNOSIS — S82201C Unspecified fracture of shaft of right tibia, initial encounter for open fracture type IIIA, IIIB, or IIIC: Secondary | ICD-10-CM | POA: Diagnosis not present

## 2023-05-03 DIAGNOSIS — I609 Nontraumatic subarachnoid hemorrhage, unspecified: Secondary | ICD-10-CM

## 2023-05-03 DIAGNOSIS — R569 Unspecified convulsions: Secondary | ICD-10-CM

## 2023-05-03 DIAGNOSIS — S82401C Unspecified fracture of shaft of right fibula, initial encounter for open fracture type IIIA, IIIB, or IIIC: Secondary | ICD-10-CM | POA: Diagnosis not present

## 2023-05-03 DIAGNOSIS — S41102A Unspecified open wound of left upper arm, initial encounter: Secondary | ICD-10-CM | POA: Diagnosis not present

## 2023-05-03 LAB — GLUCOSE, CAPILLARY
Glucose-Capillary: 153 mg/dL — ABNORMAL HIGH (ref 70–99)
Glucose-Capillary: 145 mg/dL — ABNORMAL HIGH (ref 70–99)
Glucose-Capillary: 113 mg/dL — ABNORMAL HIGH (ref 70–99)
Glucose-Capillary: 171 mg/dL — ABNORMAL HIGH (ref 70–99)
Glucose-Capillary: 155 mg/dL — ABNORMAL HIGH (ref 70–99)
Glucose-Capillary: 136 mg/dL — ABNORMAL HIGH (ref 70–99)

## 2023-05-03 LAB — RENAL FUNCTION PANEL
Albumin: 2 g/dL — ABNORMAL LOW (ref 3.5–5.0)
Albumin: 1.8 g/dL — ABNORMAL LOW (ref 3.5–5.0)
Anion gap: 8 (ref 5–15)
Anion gap: 12 (ref 5–15)
BUN: 57 mg/dL — ABNORMAL HIGH (ref 8–23)
BUN: 61 mg/dL — ABNORMAL HIGH (ref 8–23)
CO2: 25 mmol/L (ref 22–32)
CO2: 25 mmol/L (ref 22–32)
Calcium: 8.8 mg/dL — ABNORMAL LOW (ref 8.9–10.3)
Calcium: 8.8 mg/dL — ABNORMAL LOW (ref 8.9–10.3)
Chloride: 103 mmol/L (ref 98–111)
Chloride: 102 mmol/L (ref 98–111)
Creatinine, Ser: 2.89 mg/dL — ABNORMAL HIGH (ref 0.61–1.24)
Creatinine, Ser: 2.74 mg/dL — ABNORMAL HIGH (ref 0.61–1.24)
GFR, Estimated: 23 mL/min — ABNORMAL LOW (ref >60)
GFR, Estimated: 24 mL/min — ABNORMAL LOW (ref >60)
Glucose, Bld: 140 mg/dL — ABNORMAL HIGH (ref 70–99)
Glucose, Bld: 165 mg/dL — ABNORMAL HIGH (ref 70–99)
Phosphorus: 3.6 mg/dL (ref 2.5–4.6)
Phosphorus: 3 mg/dL (ref 2.5–4.6)
Potassium: 5.2 mmol/L — ABNORMAL HIGH (ref 3.5–5.1)
Potassium: 4.7 mmol/L (ref 3.5–5.1)
Sodium: 136 mmol/L (ref 135–145)
Sodium: 139 mmol/L (ref 135–145)

## 2023-05-03 LAB — CBC
HCT: 30.9 % — ABNORMAL LOW (ref 39.0–52.0)
Hemoglobin: 9.8 g/dL — ABNORMAL LOW (ref 13.0–17.0)
MCH: 29.3 pg (ref 26.0–34.0)
MCHC: 31.7 g/dL (ref 30.0–36.0)
MCV: 92.5 fL (ref 80.0–100.0)
Platelets: 160 10*3/uL (ref 150–400)
RBC: 3.34 MIL/uL — ABNORMAL LOW (ref 4.22–5.81)
RDW: 16.7 % — ABNORMAL HIGH (ref 11.5–15.5)
WBC: 13.1 10*3/uL — ABNORMAL HIGH (ref 4.0–10.5)
nRBC: 0 % (ref 0.0–0.2)

## 2023-05-03 LAB — POCT I-STAT 7, (LYTES, BLD GAS, ICA,H+H)
Acid-Base Excess: 1 mmol/L (ref 0.0–2.0)
Bicarbonate: 25.5 mmol/L (ref 20.0–28.0)
Calcium, Ion: 1.22 mmol/L (ref 1.15–1.40)
HCT: 31 % — ABNORMAL LOW (ref 39.0–52.0)
Hemoglobin: 10.5 g/dL — ABNORMAL LOW (ref 13.0–17.0)
O2 Saturation: 99 %
Patient temperature: 98.6
Potassium: 4.9 mmol/L (ref 3.5–5.1)
Sodium: 134 mmol/L — ABNORMAL LOW (ref 135–145)
TCO2: 27 mmol/L (ref 22–32)
pCO2 arterial: 38.6 mmHg (ref 32–48)
pH, Arterial: 7.428 (ref 7.35–7.45)
pO2, Arterial: 135 mmHg — ABNORMAL HIGH (ref 83–108)

## 2023-05-03 LAB — TRIGLYCERIDES: Triglycerides: 49 mg/dL (ref <150)

## 2023-05-03 LAB — HEPARIN LEVEL (UNFRACTIONATED)
Heparin Unfractionated: 0.71 [IU]/mL — ABNORMAL HIGH (ref 0.30–0.70)
Heparin Unfractionated: 0.63 [IU]/mL (ref 0.30–0.70)
Heparin Unfractionated: 0.52 [IU]/mL (ref 0.30–0.70)

## 2023-05-03 LAB — MAGNESIUM: Magnesium: 2.7 mg/dL — ABNORMAL HIGH (ref 1.7–2.4)

## 2023-05-03 MED ORDER — LACTULOSE 10 GM/15ML PO SOLN
20.0000 g | Freq: Two times a day (BID) | ORAL | Status: DC
Start: 2023-05-03 — End: 2023-05-03

## 2023-05-03 MED ORDER — POLYETHYLENE GLYCOL 3350 17 G PO PACK
17.0000 g | PACK | Freq: Two times a day (BID) | ORAL | Status: DC
  Administered 2023-05-03 – 2023-05-05 (×6): 17 g
  Filled 2023-05-03 (×6): qty 1

## 2023-05-03 MED ORDER — MAGNESIUM CITRATE PO SOLN
0.5000 | Freq: Once | ORAL | Status: DC
  Filled 2023-05-03: qty 296

## 2023-05-03 MED ORDER — ALTEPLASE 2 MG IJ SOLR
2.0000 mg | Freq: Once | INTRAMUSCULAR | Status: AC
  Administered 2023-05-03: 2 mg
  Filled 2023-05-03: qty 2

## 2023-05-03 MED ORDER — LACTULOSE 10 GM/15ML PO SOLN
30.0000 g | Freq: Once | ORAL | Status: AC
  Administered 2023-05-03: 30 g
  Filled 2023-05-03: qty 45

## 2023-05-03 MED ORDER — ALBUMIN HUMAN 5 % IV SOLN
12.5000 g | Freq: Once | INTRAVENOUS | Status: AC
  Administered 2023-05-03: 12.5 g via INTRAVENOUS

## 2023-05-03 MED ORDER — PANTOPRAZOLE SODIUM 40 MG IV SOLR
40.0000 mg | INTRAVENOUS | Status: DC
  Administered 2023-05-03 – 2023-05-05 (×3): 40 mg via INTRAVENOUS
  Filled 2023-05-03 (×3): qty 10

## 2023-05-03 MED ORDER — BISACODYL 10 MG RE SUPP
10.0000 mg | Freq: Once | RECTAL | Status: AC
  Administered 2023-05-03: 10 mg via RECTAL

## 2023-05-03 MED ORDER — MAGNESIUM CITRATE PO SOLN
0.5000 | Freq: Once | ORAL | Status: AC
  Administered 2023-05-03: 0.5
  Filled 2023-05-03: qty 296

## 2023-05-03 MED ORDER — SENNA 8.6 MG PO TABS
2.0000 | ORAL_TABLET | Freq: Once | ORAL | Status: AC
  Administered 2023-05-03: 17.2 mg
  Filled 2023-05-03: qty 2

## 2023-05-03 MED ORDER — ALBUMIN HUMAN 5 % IV SOLN
INTRAVENOUS | Status: AC
  Filled 2023-05-03: qty 250

## 2023-05-03 MED ORDER — METHOCARBAMOL 500 MG PO TABS
750.0000 mg | ORAL_TABLET | Freq: Three times a day (TID) | ORAL | Status: DC
  Administered 2023-05-03 – 2023-05-06 (×10): 750 mg
  Filled 2023-05-03 (×10): qty 2
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
## 2023-05-03 NOTE — Progress Notes (Signed)
RT assisted with transport of this pt from 4N to CT and back while on full ventilatory support. Pt tolerated well.
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
## 2023-05-03 NOTE — Progress Notes (Signed)
 CRRT unable to pull from access line after patient was switched to full support. Patient double stacking on the ventilator and WOB increased. Fentanyl  given. No change in presentation. Patient hypotensive, Levophed  restarted. Attempted to reposition CRRT lines, flush and pull back CRRT lines. Respiratory called to switch patient back to CPAP mode as this has been an issue in the past. CRRT filter changed due to air in system.

## 2023-05-03 NOTE — TOC Progression Note (Addendum)
 Transition of Care [facility]) - Progression Note    Patient Details  Name: Romir Klimowicz MRN: [id number] Date of Birth: 02/07/1951  Transition of Care [facility]) CM/SW Contact  Hernan Turnage, Mliss HERO, RN Phone Number: 05/03/2023, 3:50 PM  Clinical Narrative:    Updated clinical information faxed to Glendale Manas, field case manager for Iva Sinner, patient's [facility] agency. Fax [phone].  Spoke with Ms. Hamby and provided update, answered questions, etc.   Expected Discharge Plan: IP Rehab Facility Barriers to Discharge: Continued Medical Work up  Expected Discharge Plan and Services   Discharge Planning Services: CM Consult   Living arrangements for the past 2 months: Single Family Home                                       Social Determinants of Health (SDOH) Interventions    Readmission Risk Interventions     No data to display         Mliss MICAEL Fass, RN, BSN  Trauma/Neuro ICU Case Manager [phone]
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
## 2023-05-03 NOTE — Progress Notes (Signed)
EEG complete - results pending
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
## 2023-05-03 NOTE — Progress Notes (Addendum)
 Pt switched from PRVC to PS/CPAP 09/27/48% per RN request. RN stated PRVC was interfering with CRRT machine, causing air to get into the line..  While I did not note any respiratory distress when I arrived, pt has tolerated PS/CPAP well prior, I switched mode for RN while she troubleshot the CRRT machine.   Pt is in no distress at this time. Informed RN she could call RT if she needs any more assistance with the ventilator.

## 2023-05-03 NOTE — Progress Notes (Signed)
 Called to PT bedside due to increased respiratory rate. Informed RN that dayshift RT passed in report that pt tolerated PS/CPAP vent mode over PRVC, but he could be getting tired. Switched back to [facility]. Pt's rate decreased and appears more comfortable at this time. Pt also suctioned and HME filter replaced. RN is requesting ABG and chest x-ray.
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
## 2023-05-03 NOTE — Procedures (Signed)
 Patient Name: Kaiyden Simkin  MRN: [id number]  Epilepsy Attending: Arlin MALVA Krebs  Referring Physician/Provider: [name], MD  Date: 05/03/2023 Duration: 28.03 mins  Patient history: 73yo M with diffuse SAH, right and left SDH and bitemporal lobe contusion. EEG to evaluate for seizure.  Level of alertness: Awake/ lethargic   AEDs during EEG study: LEV  Technical aspects: This EEG study was done with scalp electrodes positioned according to the 10-20 International system of electrode placement. Electrical activity was reviewed with band pass filter of 1-70Hz , sensitivity of 7 uV/mm, display speed of 78mm/sec with a 60Hz  notched filter applied as appropriate. EEG data were recorded continuously and digitally stored.  Video monitoring was available and reviewed as appropriate.  Description: EEG showed continuous generalized and lateralized right hemisphere 3 to 6 Hz theta-delta slowing admixed with 12-14Hz  beta activity. Sharp waves were noted in left fronto-temporal region, at times qasi-periodic at 0.5-1hz . Hyperventilation and photic stimulation were not performed.     ABNORMALITY - Sharp wave, left fronto-temporal region - Continuous slow, generalized and lateralized right hemisphere   IMPRESSION: This study showed evidence of epileptogenicity arising from left fronto-temporal region. Additionally there is cortical dysfunction arising from right hemisphere likely secondary to underlying structural abnormality. Lastly there is moderate diffuse encephalopathy. No seizures were seen throughout the recording.  Betzabeth Derringer O Matheus Spiker

## 2023-05-03 NOTE — Progress Notes (Signed)
 1 Day Post-Op  Subjective: Patient is a 73 year old male status post excision of left arm wound and soft tissue, application of myriad and VAC placement to left arm as well as partial closure of right leg, myriad placement to right leg and wound VAC placement with Dr. Lowery yesterday in conjunction with Dr. Celena who placed IMN to right tibia.  Patient remains intubated, daughter at bedside.   Objective: Vital signs in last 24 hours: Temp:  [97.6 F (36.4 C)-99.1 F (37.3 C)] 98.7 F (37.1 C) (02/07 1200) Pulse Rate:  [71-103] 103 (02/07 1126) Resp:  [11-27] 25 (02/07 1126) BP: (78-137)/(57-81) 99/60 (02/07 0900) SpO2:  [92 %-100 %] 97 % (02/07 1126) Arterial Line BP: (64-150)/(47-108) 117/54 (02/07 0915) FiO2 (%):  [4 %-50 %] 40 % (02/07 1126) Last BM Date :  (PTA)  Intake/Output from previous day: 02/06 0701 - 02/07 0700 In: 2784.6 [I.V.:855.1; NG/GT:1263.3; IV Piggyback:666.1] Out: 4443.2 [Drains:195; Blood:100; Chest Tube:30] Intake/Output this shift: Total I/O In: 786.2 [I.V.:96.2; NG/GT:590; IV Piggyback:100] Out: 740 [Chest Tube:20]  General appearance: Intubated and sedated Extremities:  Left upper extremity with Ace wrap's in place, wound VAC in place with 150 cc of serosanguineous drainage in canister.  Wound VAC with good seal noted.  Right lower extremity with Ace wrap's in place, wound VAC in place with 200 cc of serosanguineous drainage in canister.  Good seal noted.   Lab Results:     Latest Ref Rng & Units 05/03/2023    5:45 AM 05/03/2023    1:38 AM 05/02/2023    4:54 AM  CBC  WBC 4.0 - 10.5 K/uL 13.1   9.0   Hemoglobin 13.0 - 17.0 g/dL 9.8  89.4  9.7   Hematocrit 39.0 - 52.0 % 30.9  31.0  31.0   Platelets 150 - 400 K/uL 160   109     BMET Recent Labs    05/02/23 1550 05/03/23 0138 05/03/23 0545  NA 136 134* 136  K 5.2* 4.9 5.2*  CL 102  --  103  CO2 23  --  25  GLUCOSE 122*  --  140*  BUN 60*  --  57*  CREATININE 3.34*  --  2.89*  CALCIUM   7.6*  --  8.8*   PT/INR No results for input(s): LABPROT, INR in the last 72 hours. ABG Recent Labs    05/01/23 0828 05/03/23 0138  PHART 7.372 7.428  HCO3 25.9 25.5    Studies/Results: CT HEAD WO CONTRAST ( ) Result Date: 05/03/2023 CLINICAL DATA:  Head trauma, moderate-severe EXAM: CT HEAD WITHOUT CONTRAST TECHNIQUE: Contiguous axial images were obtained from the base of the skull through the vertex without intravenous contrast. RADIATION DOSE REDUCTION: This exam was performed according to the departmental dose-optimization program which includes automated exposure control, adjustment of the mA and/or kV according to patient size and/or use of iterative reconstruction technique. COMPARISON:  Head CT 05/01/2023 FINDINGS: Brain: Redemonstrated is a 6 mm left frontal convexity subdural hematoma unchanged from prior exam. Redemonstrated is a 8 mm right middle cranial fossa subdural hematoma, similar to prior exam there is diffuse subarachnoid hemorrhage along the bilateral cerebral convexities with layering blood products in the bilateral occipital horns the volume of intraventricular blood products is stable to slightly increased from prior exam. Hypodensities involving the anterior temporal lobes and inferomedial frontal lobes bilaterally likely represent posttraumatic contusions. There is minimal interval increase in size of the left temporal horn compared to prior exam. 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 No CT evidence of an acute cortical infarct. Vascular: No hyperdense vessel or unexpected calcification. Skull: Redemonstrated complex calvarial maxillofacial fractures as described previously Sinuses/Orbits: Small bilateral mastoid effusions, right-greater-than-left air-fluid levels in bilateral sphenoid sinuses. Orbits are unremarkable Other: None. IMPRESSION: 1. Stable 6 mm left frontal convexity and 8 mm right middle cranial fossa subdural  hematomas. 2. Stable subarachnoid hemorrhage along the bilateral cerebral convexities. Stable to slightly increased volume of intraventricular blood products. Minimal interval increase in size of the left temporal horn compared to prior exam 3. Stable hypodensities involving the anterior temporal lobes and inferomedial frontal lobes bilaterally likely represent posttraumatic contusions. 4. Redemonstrated complex calvarial and maxillofacial fractures as described previously. Electronically Signed   By: [name]   On: 05/03/2023 11:56   DG CHEST PORT 1 VIEW Result Date: 05/03/2023 CLINICAL DATA:  Follow-up pneumothorax EXAM: PORTABLE CHEST 1 VIEW COMPARISON:  Film from earlier in the same day. FINDINGS: Right-sided pigtail catheter, endotracheal tube and nasogastric catheter are again seen and stable. Right PICC and temporary dialysis catheter in the right jugular vein are again seen. Minimal right apical pneumothorax is again noted stable from the recent exam. No new focal infiltrate or effusion is noted. Persistent right basilar atelectasis is seen. IMPRESSION: Stable appearance when compared with the earlier film. No new increase in pneumothorax is noted. Electronically Signed   By: [name]   On: 05/03/2023 10:23   DG CHEST PORT 1 VIEW Result Date: 05/03/2023 CLINICAL DATA:  Respiratory abnormality. EXAM: PORTABLE CHEST 1 VIEW COMPARISON:  May 01, 2023 FINDINGS: An endotracheal tube is seen with its distal tip approximately 2.9 cm from the carina. Stable enteric tube, right internal jugular venous catheter and right-sided chest tube positioning is noted. The heart size and mediastinal contours are within normal limits. Marked severity calcification of the aortic arch is seen. A small right apical pneumothorax is seen which is mildly decreased in size when compared to the prior study. Mild stable mid to lower right lung atelectatic changes are noted. No pleural effusion is seen. A stable  amount of soft tissue air is noted along the periphery of the right rib cage. The visualized skeletal structures are unremarkable. IMPRESSION: 1. Small right apical pneumothorax which is mildly decreased in size when compared to the prior study. 2. Stable mid to lower right lung atelectatic changes. 3. Stable support apparatus. Electronically Signed   By: Suzen Dials M.D.   On: 05/03/2023 02:13   DG Tibia/Fibula Right Port Result Date: 05/02/2023 CLINICAL DATA:  Postop. EXAM: PORTABLE RIGHT TIBIA AND FIBULA - 2 VIEW COMPARISON:  Preoperative radiograph 04/29/2023 FINDINGS: Tibial intramedullary nail with proximal and distal locking screw fixation traversing distal tibial fracture. The previous external fixator has been removed. Segmental fibular fracture again seen. Recent postsurgical change includes air and edema in the soft tissues. Wound VAC distally. IMPRESSION: ORIF of distal tibial fracture. Electronically Signed   By: Andrea Gasman M.D.   On: 05/02/2023 16:05   DG Tibia/Fibula Right Result Date: 05/02/2023 CLINICAL DATA:  Elective surgery. EXAM: RIGHT TIBIA AND FIBULA - 2 VIEW COMPARISON:  Radiographs 04/29/2023 FINDINGS: Previous intramedullary rod is been removed, subsequent tibial intramedullary nail with proximal and distal locking screw fixation. Portions of segmental fibular fracture or visualized. Fluoroscopy time 71.2 seconds. Dose 1.85 mGy. IMPRESSION: Intraoperative fluoroscopy during tibial ORIF. Electronically Signed   By: Andrea Gasman M.D.   On: 05/02/2023 16:00   DG C-Arm 1-60 Min-No  Report Result Date: 05/02/2023 Fluoroscopy was utilized by the requesting physician.  No radiographic interpretation.   DG C-Arm 1-60 Min-No Report Result Date: 05/02/2023 Fluoroscopy was utilized by the requesting physician.  No radiographic interpretation.    Anti-infectives: Anti-infectives (From admission, onward)    Start     Dose/Rate Route Frequency Ordered Stop   05/03/23 0800   cefTRIAXone  (ROCEPHIN ) 2 g in sodium chloride  0.9 % 100 mL IVPB        2 g 200 mL/hr over 30 Minutes Intravenous Every 24 hours 05/02/23 1447 19-May-2023 0759   05/02/23 1331  vancomycin  (VANCOCIN ) powder  Status:  Discontinued          As needed 05/02/23 1331 05/02/23 1447   05/02/23 1330  tobramycin  (NEBCIN ) powder  Status:  Discontinued          As needed 05/02/23 1330 05/02/23 1447   05/02/23 0600  ceFAZolin  (ANCEF ) IVPB 2g/100 mL premix        2 g 200 mL/hr over 30 Minutes Intravenous On call to O.R. 05/02/23 0117 05/02/23 1151   04/30/23 0800  cefTRIAXone  (ROCEPHIN ) 2 g in sodium chloride  0.9 % 100 mL IVPB        2 g 200 mL/hr over 30 Minutes Intravenous Every 24 hours 04/29/23 1630 05/02/23 1000   04/29/23 1432  tobramycin  (NEBCIN ) powder  Status:  Discontinued          As needed 04/29/23 1432 04/29/23 1600   04/29/23 1432  vancomycin  (VANCOCIN ) powder  Status:  Discontinued          As needed 04/29/23 1433 04/29/23 1600   04/27/23 1000  cefTRIAXone  (ROCEPHIN ) 2 g in sodium chloride  0.9 % 100 mL IVPB  Status:  Discontinued        2 g 200 mL/hr over 30 Minutes Intravenous Every 24 hours 04/26/23 1618 04/26/23 1624   04/27/23 1000  cefTRIAXone  (ROCEPHIN ) 2 g in sodium chloride  0.9 % 100 mL IVPB        2 g 200 mL/hr over 30 Minutes Intravenous Every 24 hours 04/26/23 1624 04/29/23 0936   04/26/23 1730  ceFAZolin  (ANCEF ) IVPB 2g/100 mL premix  Status:  Discontinued        2 g 200 mL/hr over 30 Minutes Intravenous  Once 04/26/23 1634 04/26/23 2101   04/26/23 1730  cefTRIAXone  (ROCEPHIN ) 2 g in sodium chloride  0.9 % 100 mL IVPB        2 g 200 mL/hr over 30 Minutes Intravenous  Once 04/26/23 1643 04/26/23 2101   04/26/23 1459  vancomycin  (VANCOCIN ) powder  Status:  Discontinued          As needed 04/26/23 1459 04/26/23 1542   04/26/23 1459  vancomycin  (VANCOCIN ) powder  Status:  Discontinued          As needed 04/26/23 1459 04/26/23 1542   04/26/23 1457  tobramycin  (NEBCIN ) powder   Status:  Discontinued          As needed 04/26/23 1457 04/26/23 1542   04/26/23 1457  tobramycin  (NEBCIN ) powder  Status:  Discontinued          As needed 04/26/23 1458 04/26/23 1542       Assessment/Plan: s/p Procedure(s): IRRIGATION AND DEBRIDEMENT LEFT UPPER EXTREMITY IRRIGATION AND DEBRIDEMENT RIGHT LOWER EXTREMITY INTRAMEDULLARY (IM) NAIL TIBIAL  Left upper extremity and right lower extremity wound:  Wound vacs in place, will plan to return to the OR next week for wound VAC change, possible application of additional myriad  wound matrix and debridement.  Discussed surgical plan with patient's daughter, all of her questions were answered to her content.  We will continue to follow and will reevaluate patient next week.    LOS: 7 days    Donnice JINNY Edelson, PA-C 05/03/2023

## 2023-05-04 ENCOUNTER — Inpatient Hospital Stay (HOSPITAL_COMMUNITY): Payer: Managed Care, Other (non HMO)

## 2023-05-04 LAB — RENAL FUNCTION PANEL
Albumin: 1.8 g/dL — ABNORMAL LOW (ref 3.5–5.0)
Albumin: 1.7 g/dL — ABNORMAL LOW (ref 3.5–5.0)
Anion gap: 15 (ref 5–15)
Anion gap: 11 (ref 5–15)
BUN: 56 mg/dL — ABNORMAL HIGH (ref 8–23)
BUN: 57 mg/dL — ABNORMAL HIGH (ref 8–23)
CO2: 23 mmol/L (ref 22–32)
CO2: 25 mmol/L (ref 22–32)
Calcium: 9.5 mg/dL (ref 8.9–10.3)
Calcium: 9.1 mg/dL (ref 8.9–10.3)
Chloride: 99 mmol/L (ref 98–111)
Chloride: 101 mmol/L (ref 98–111)
Creatinine, Ser: 2.4 mg/dL — ABNORMAL HIGH (ref 0.61–1.24)
Creatinine, Ser: 2.38 mg/dL — ABNORMAL HIGH (ref 0.61–1.24)
GFR, Estimated: 28 mL/min — ABNORMAL LOW (ref >60)
GFR, Estimated: 29 mL/min — ABNORMAL LOW (ref >60)
Glucose, Bld: 164 mg/dL — ABNORMAL HIGH (ref 70–99)
Glucose, Bld: 165 mg/dL — ABNORMAL HIGH (ref 70–99)
Phosphorus: 3.2 mg/dL (ref 2.5–4.6)
Phosphorus: 3.4 mg/dL (ref 2.5–4.6)
Potassium: 4.4 mmol/L (ref 3.5–5.1)
Potassium: 4.3 mmol/L (ref 3.5–5.1)
Sodium: 137 mmol/L (ref 135–145)
Sodium: 137 mmol/L (ref 135–145)

## 2023-05-04 LAB — CBC
HCT: 29.5 % — ABNORMAL LOW (ref 39.0–52.0)
Hemoglobin: 9.4 g/dL — ABNORMAL LOW (ref 13.0–17.0)
MCH: 29.6 pg (ref 26.0–34.0)
MCHC: 31.9 g/dL (ref 30.0–36.0)
MCV: 92.8 fL (ref 80.0–100.0)
Platelets: 164 10*3/uL (ref 150–400)
RBC: 3.18 MIL/uL — ABNORMAL LOW (ref 4.22–5.81)
RDW: 16.7 % — ABNORMAL HIGH (ref 11.5–15.5)
WBC: 15.6 10*3/uL — ABNORMAL HIGH (ref 4.0–10.5)
nRBC: 0 % (ref 0.0–0.2)

## 2023-05-04 LAB — MAGNESIUM: Magnesium: 2.6 mg/dL — ABNORMAL HIGH (ref 1.7–2.4)

## 2023-05-04 LAB — GLUCOSE, CAPILLARY
Glucose-Capillary: 146 mg/dL — ABNORMAL HIGH (ref 70–99)
Glucose-Capillary: 150 mg/dL — ABNORMAL HIGH (ref 70–99)
Glucose-Capillary: 144 mg/dL — ABNORMAL HIGH (ref 70–99)
Glucose-Capillary: 152 mg/dL — ABNORMAL HIGH (ref 70–99)
Glucose-Capillary: 158 mg/dL — ABNORMAL HIGH (ref 70–99)
Glucose-Capillary: 138 mg/dL — ABNORMAL HIGH (ref 70–99)

## 2023-05-04 LAB — HEPARIN LEVEL (UNFRACTIONATED)
Heparin Unfractionated: 0.49 [IU]/mL (ref 0.30–0.70)
Heparin Unfractionated: 0.38 [IU]/mL (ref 0.30–0.70)

## 2023-05-04 MED ORDER — LEVETIRACETAM 750 MG PO TABS
750.0000 mg | ORAL_TABLET | Freq: Two times a day (BID) | ORAL | Status: DC
  Administered 2023-05-04 – 2023-05-06 (×5): 750 mg
  Filled 2023-05-04 (×5): qty 1
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
## 2023-05-05 ENCOUNTER — Inpatient Hospital Stay (HOSPITAL_COMMUNITY): Payer: Managed Care, Other (non HMO)

## 2023-05-05 LAB — RENAL FUNCTION PANEL
Albumin: 1.7 g/dL — ABNORMAL LOW (ref 3.5–5.0)
Albumin: 1.6 g/dL — ABNORMAL LOW (ref 3.5–5.0)
Anion gap: 9 (ref 5–15)
Anion gap: 12 (ref 5–15)
BUN: 55 mg/dL — ABNORMAL HIGH (ref 8–23)
BUN: 55 mg/dL — ABNORMAL HIGH (ref 8–23)
CO2: 25 mmol/L (ref 22–32)
CO2: 23 mmol/L (ref 22–32)
Calcium: 9.4 mg/dL (ref 8.9–10.3)
Calcium: 9.2 mg/dL (ref 8.9–10.3)
Chloride: 102 mmol/L (ref 98–111)
Chloride: 100 mmol/L (ref 98–111)
Creatinine, Ser: 2.26 mg/dL — ABNORMAL HIGH (ref 0.61–1.24)
Creatinine, Ser: 2.26 mg/dL — ABNORMAL HIGH (ref 0.61–1.24)
GFR, Estimated: 30 mL/min — ABNORMAL LOW (ref >60)
GFR, Estimated: 30 mL/min — ABNORMAL LOW (ref >60)
Glucose, Bld: 161 mg/dL — ABNORMAL HIGH (ref 70–99)
Glucose, Bld: 166 mg/dL — ABNORMAL HIGH (ref 70–99)
Phosphorus: 2.8 mg/dL (ref 2.5–4.6)
Phosphorus: 3.2 mg/dL (ref 2.5–4.6)
Potassium: 4.1 mmol/L (ref 3.5–5.1)
Potassium: 4.1 mmol/L (ref 3.5–5.1)
Sodium: 136 mmol/L (ref 135–145)
Sodium: 135 mmol/L (ref 135–145)

## 2023-05-05 LAB — CBC
HCT: 29.9 % — ABNORMAL LOW (ref 39.0–52.0)
Hemoglobin: 9.5 g/dL — ABNORMAL LOW (ref 13.0–17.0)
MCH: 29.5 pg (ref 26.0–34.0)
MCHC: 31.8 g/dL (ref 30.0–36.0)
MCV: 92.9 fL (ref 80.0–100.0)
Platelets: 172 10*3/uL (ref 150–400)
RBC: 3.22 MIL/uL — ABNORMAL LOW (ref 4.22–5.81)
RDW: 16.9 % — ABNORMAL HIGH (ref 11.5–15.5)
WBC: 23.9 10*3/uL — ABNORMAL HIGH (ref 4.0–10.5)
nRBC: 0 % (ref 0.0–0.2)

## 2023-05-05 LAB — GLUCOSE, CAPILLARY
Glucose-Capillary: 148 mg/dL — ABNORMAL HIGH (ref 70–99)
Glucose-Capillary: 139 mg/dL — ABNORMAL HIGH (ref 70–99)
Glucose-Capillary: 121 mg/dL — ABNORMAL HIGH (ref 70–99)
Glucose-Capillary: 170 mg/dL — ABNORMAL HIGH (ref 70–99)
Glucose-Capillary: 143 mg/dL — ABNORMAL HIGH (ref 70–99)

## 2023-05-05 LAB — MAGNESIUM: Magnesium: 2.5 mg/dL — ABNORMAL HIGH (ref 1.7–2.4)

## 2023-05-05 LAB — HEPARIN LEVEL (UNFRACTIONATED): Heparin Unfractionated: 0.39 [IU]/mL (ref 0.30–0.70)
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
## 2023-05-06 ENCOUNTER — Inpatient Hospital Stay (HOSPITAL_COMMUNITY): Payer: Managed Care, Other (non HMO)

## 2023-05-06 ENCOUNTER — Encounter (HOSPITAL_COMMUNITY): Payer: Self-pay | Admitting: Orthopedic Surgery

## 2023-05-06 DIAGNOSIS — S066XAA Traumatic subarachnoid hemorrhage with loss of consciousness status unknown, initial encounter: Secondary | ICD-10-CM | POA: Diagnosis not present

## 2023-05-06 DIAGNOSIS — R9431 Abnormal electrocardiogram [ECG] [EKG]: Secondary | ICD-10-CM

## 2023-05-06 DIAGNOSIS — G935 Compression of brain: Secondary | ICD-10-CM | POA: Diagnosis not present

## 2023-05-06 DIAGNOSIS — I469 Cardiac arrest, cause unspecified: Secondary | ICD-10-CM

## 2023-05-06 DIAGNOSIS — Z23 Encounter for immunization: Secondary | ICD-10-CM | POA: Diagnosis not present

## 2023-05-06 DIAGNOSIS — S27331A Laceration of lung, unilateral, initial encounter: Secondary | ICD-10-CM | POA: Diagnosis not present

## 2023-05-06 DIAGNOSIS — T07XXXA Unspecified multiple injuries, initial encounter: Secondary | ICD-10-CM

## 2023-05-06 LAB — RENAL FUNCTION PANEL
Albumin: <1.5 g/dL — ABNORMAL LOW (ref 3.5–5.0)
Anion gap: 17 — ABNORMAL HIGH (ref 5–15)
BUN: 63 mg/dL — ABNORMAL HIGH (ref 8–23)
CO2: 18 mmol/L — ABNORMAL LOW (ref 22–32)
Calcium: 8.9 mg/dL (ref 8.9–10.3)
Chloride: 99 mmol/L (ref 98–111)
Creatinine, Ser: 2.73 mg/dL — ABNORMAL HIGH (ref 0.61–1.24)
GFR, Estimated: 24 mL/min — ABNORMAL LOW (ref >60)
Glucose, Bld: 319 mg/dL — ABNORMAL HIGH (ref 70–99)
Phosphorus: 6.2 mg/dL — ABNORMAL HIGH (ref 2.5–4.6)
Potassium: 4.6 mmol/L (ref 3.5–5.1)
Sodium: 134 mmol/L — ABNORMAL LOW (ref 135–145)

## 2023-05-06 LAB — CBC
HCT: 29.8 % — ABNORMAL LOW (ref 39.0–52.0)
Hemoglobin: 9.1 g/dL — ABNORMAL LOW (ref 13.0–17.0)
MCH: 29.4 pg (ref 26.0–34.0)
MCHC: 30.5 g/dL (ref 30.0–36.0)
MCV: 96.1 fL (ref 80.0–100.0)
Platelets: 182 10*3/uL (ref 150–400)
RBC: 3.1 MIL/uL — ABNORMAL LOW (ref 4.22–5.81)
RDW: 17 % — ABNORMAL HIGH (ref 11.5–15.5)
WBC: 35 10*3/uL — ABNORMAL HIGH (ref 4.0–10.5)
nRBC: 0.1 % (ref 0.0–0.2)

## 2023-05-06 LAB — POCT I-STAT 7, (LYTES, BLD GAS, ICA,H+H)
Acid-base deficit: 7 mmol/L — ABNORMAL HIGH (ref 0.0–2.0)
Bicarbonate: 19.6 mmol/L — ABNORMAL LOW (ref 20.0–28.0)
Calcium, Ion: 1.2 mmol/L (ref 1.15–1.40)
HCT: 27 % — ABNORMAL LOW (ref 39.0–52.0)
Hemoglobin: 9.2 g/dL — ABNORMAL LOW (ref 13.0–17.0)
O2 Saturation: 93 %
Patient temperature: 98.9
Potassium: 4.7 mmol/L (ref 3.5–5.1)
Sodium: 134 mmol/L — ABNORMAL LOW (ref 135–145)
TCO2: 21 mmol/L — ABNORMAL LOW (ref 22–32)
pCO2 arterial: 46.2 mmHg (ref 32–48)
pH, Arterial: 7.237 — ABNORMAL LOW (ref 7.35–7.45)
pO2, Arterial: 78 mmHg — ABNORMAL LOW (ref 83–108)

## 2023-05-06 LAB — LACTIC ACID, PLASMA: Lactic Acid, Venous: 1.7 mmol/L (ref 0.5–1.9)

## 2023-05-06 LAB — MAGNESIUM: Magnesium: 3 mg/dL — ABNORMAL HIGH (ref 1.7–2.4)

## 2023-05-06 LAB — PROTEIN, TOTAL: Total Protein: 5 g/dL — ABNORMAL LOW (ref 6.5–8.1)

## 2023-05-06 LAB — TROPONIN I (HIGH SENSITIVITY)
Troponin I (High Sensitivity): 69 ng/L — ABNORMAL HIGH (ref <18)
Troponin I (High Sensitivity): 946 ng/L (ref <18)
Troponin I (High Sensitivity): 1408 ng/L

## 2023-05-06 LAB — ECHOCARDIOGRAM COMPLETE
Area-P 1/2: 4.15 cm2
Est EF: >75
Height: 72.008 in
S' Lateral: 3.1 cm
Weight: 3319.25 [oz_av]

## 2023-05-06 LAB — AST: AST: 179 U/L — ABNORMAL HIGH (ref 15–41)

## 2023-05-06 LAB — GLUCOSE, CAPILLARY
Glucose-Capillary: 138 mg/dL — ABNORMAL HIGH (ref 70–99)
Glucose-Capillary: 134 mg/dL — ABNORMAL HIGH (ref 70–99)

## 2023-05-06 LAB — ALT: ALT: 69 U/L — ABNORMAL HIGH (ref 0–44)

## 2023-05-06 LAB — HEPARIN LEVEL (UNFRACTIONATED): Heparin Unfractionated: 0.31 [IU]/mL (ref 0.30–0.70)

## 2023-05-06 LAB — BILIRUBIN, TOTAL: Total Bilirubin: 0.9 mg/dL (ref 0.0–1.2)

## 2023-05-06 MED ORDER — SODIUM CHLORIDE 0.9% FLUSH: 3.0000 mL | INTRAVENOUS | Status: DC | PRN

## 2023-05-06 MED ORDER — GLYCOPYRROLATE 0.2 MG/ML IJ SOLN: 0.2000 mg | INTRAMUSCULAR | Status: DC | PRN

## 2023-05-06 MED ORDER — MORPHINE BOLUS VIA INFUSION: 5.0000 mg | INTRAVENOUS | Status: DC | PRN

## 2023-05-06 MED ORDER — GLYCOPYRROLATE 1 MG PO TABS: 1.0000 mg | ORAL_TABLET | ORAL | Status: DC | PRN

## 2023-05-06 MED ORDER — SODIUM CHLORIDE 0.9% FLUSH: 3.0000 mL | Freq: Two times a day (BID) | INTRAVENOUS | Status: DC

## 2023-05-06 MED ORDER — ACETAMINOPHEN 650 MG RE SUPP: 650.0000 mg | Freq: Four times a day (QID) | RECTAL | Status: DC | PRN

## 2023-05-06 MED ORDER — MORPHINE 100MG IN NS 100ML (1MG/ML) PREMIX INFUSION
0.0000 mg/h | INTRAVENOUS | Status: DC
  Administered 2023-05-06: 5 mg/h via INTRAVENOUS
  Filled 2023-05-06: qty 100

## 2023-05-06 MED ORDER — GLYCOPYRROLATE 0.2 MG/ML IJ SOLN
0.2000 mg | INTRAMUSCULAR | Status: DC | PRN
  Administered 2023-05-06: 0.2 mg via INTRAVENOUS
  Filled 2023-05-06: qty 1

## 2023-05-06 MED ORDER — VASOPRESSIN 20 UNITS/100 ML INFUSION FOR SHOCK
INTRAVENOUS | Status: AC
  Filled 2023-05-06: qty 100

## 2023-05-06 MED ORDER — VASOPRESSIN 20 UNITS/100 ML INFUSION FOR SHOCK
0.0000 [IU]/min | INTRAVENOUS | Status: DC
  Administered 2023-05-06 (×2): 0.03 [IU]/min via INTRAVENOUS
  Filled 2023-05-06: qty 100

## 2023-05-06 MED ORDER — POLYVINYL ALCOHOL 1.4 % OP SOLN: 1.0000 [drp] | Freq: Four times a day (QID) | OPHTHALMIC | Status: DC | PRN

## 2023-05-06 MED ORDER — NOREPINEPHRINE 4 MG/250ML-% IV SOLN
0.0000 ug/min | INTRAVENOUS | Status: DC
  Administered 2023-05-06: 32 ug/min via INTRAVENOUS
  Administered 2023-05-06 (×3): 34 ug/min via INTRAVENOUS
  Administered 2023-05-06: 33 ug/min via INTRAVENOUS
  Filled 2023-05-06: qty 500
  Filled 2023-05-06: qty 750
  Filled 2023-05-06: qty 250

## 2023-05-06 MED ORDER — ACETAMINOPHEN 325 MG PO TABS: 650.0000 mg | ORAL_TABLET | Freq: Four times a day (QID) | ORAL | Status: DC | PRN

## 2023-05-06 MED ORDER — PERFLUTREN LIPID MICROSPHERE
1.0000 mL | INTRAVENOUS | Status: DC | PRN
  Administered 2023-05-06: 4 mL via INTRAVENOUS

## 2023-05-07 ENCOUNTER — Encounter (INDEPENDENT_AMBULATORY_CARE_PROVIDER_SITE_OTHER): Payer: Self-pay | Admitting: Vascular Surgery
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
## 2023-05-08 SURGERY — IRRIGATION AND DEBRIDEMENT WOUND
Anesthesia: Choice | Laterality: Left

## 2023-05-25 NOTE — Progress Notes (Signed)
RT note; patient was extubated to comfort care per MD order.
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
## 2023-05-25 NOTE — Progress Notes (Signed)
Trauma Event Note  Event Summary: TRN notified by Primary RN of issues running CRRT, issues with the trialysis catheter. TRN to bedside, notified Trauma MD. Trauma MD to bedside. Decision made to exchange catheter via wire. Catheter exchanged, patient heart rhythm had change on monitor. RN attempted to get ECG, during that patient went into PEA arrest. 2 rounds of compressions completed, 2 amps of EPI administered, ROSC obtained. Chest Xray completed. Patient noted to be in afib RVR, cardioversion completed @ 150J, patient back into sinus tach rhythm rate of 100s. Cardiology consult placed for ST elevation on patients EKG. Patient remaining hypotensive, Levo titrated up to 30 with no response, patient given 750 ml NS bolus. Cardiology at bedside with Trauma MD. BP with no response, still hypotensive with MAP in 50s. Patient placed in Trendelenburg. Vasopressin initiated at this time. Trauma MD called and spoke with patients wife at this time. Patients wife making arrangements to come to the hospital.    MD Notified: Dr. Hillery Hunter Call Time: 1139 Arrival Time: 1147 End Time: 0130  Last imported Vital Signs BP 117/67   Pulse 93   Temp 98.9 F (37.2 C) (Axillary)   Resp (!) 25   Ht 6' 0.01" (1.829 m)   Wt 94.1 kg   SpO2 (!) 79%   BMI 28.13 kg/m   Trending CBC Recent Labs    05/04/23 0406 05/05/23 0508 05/24/2023 0040 05/07/2023 0105  WBC 15.6* 23.9* 35.0*  --   HGB 9.4* 9.5* 9.1* 9.2*  HCT 29.5* 29.9* 29.8* 27.0*  PLT 164 172 182  --     Trending Coag's No results for input(s): "APTT", "INR" in the last 72 hours.  Trending BMET Recent Labs    05/05/23 0508 05/05/23 1616 05/13/2023 0040 05/04/2023 0105  NA 136 135 134* 134*  K 4.1 4.1 4.6 4.7  CL 102 100 99  --   CO2 25 23 18*  --   BUN 55* 55* 63*  --   CREATININE 2.26* 2.26* 2.73*  --   GLUCOSE 161* 166* 319*  --       Leota Sauers  Trauma Response RN  Please call TRN at [phone] for further assistance.

## 2023-05-25 NOTE — Progress Notes (Signed)
Numerous other family members have arrived. Clinical update shared with them as well. Again discussed options for continued care vs limitations of interventions. Family discussed collectively and final decision was for transition to comfort care. Will start morphine drip and compassionately extubate. Wife desires not to be present for extubation.  Additional critical care time:  Diamantina Monks, MD General and Trauma Surgery [facility]
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
## 2023-05-25 NOTE — Progress Notes (Signed)
 After getting a bath, the CRRT was alarming for a negative access pressure, the HD cath lines flushed great and had a quick blood return at times but at other times would meet resistance during blood return, attempted to reposition tubing, changing blood flow rate and the CRRT continued alarming despite interventions. Filter then began alarming for pulling air or clotting. Filter changed and CRRT was reconnected to the patient. The access pressure continued to alarm,attempted to place the patient on full support on the vent to see if that would help but despite interventions the CRRT continued to alarm. While on full support the patient's blood pressure dropped requiring increased pressors so patient was switched back to CPAP mode on the vent.  TRN and Dr. Polly came to bedside. Unable to troubleshoot the HD cath Dr. Polly decided to exchange the HD cath. Patient was disconnected from CRRT and the CRRT was set to re-circulate.   Patient began having ST elevation during HD cath exchange, stat CXR was ordered. While attempting to obtain and 12-lead EKG patient went bradycardic to asystole at 0023. Code started Dr. Polly was still at the bedside. ROSC achieved 0028. Post-code 12-lead showed SVT, patient was cardioverted. Then 12-lead showed ST elevation and then patient went into A-fib. Patient was requiring increased pressors, levo ceiling was increased and vaso was added, patient in reverse trendelenberg and is unable to sit up without blood pressure dropping.   Patient returned to sinus tachycardia and cardiologist was at bedside. No new orders received from cardiology at this time.  Verbal ordered received from Dr. Polly to stop CRRT and tube feeds for the night and re-evaluate in the AM. MAP goal >60.

## 2023-05-25 NOTE — Progress Notes (Signed)
Time of death 61, pronounced with Idell Pickles.  Dr Bedelia Person notified

## 2023-05-25 NOTE — Progress Notes (Addendum)
Critical Event Note  I was notified that the patient's dialysis catheter was not drawing properly. The patient also had an increasing pressor requirement up to 20 of levo.  I arrived at the bedside to evaluate.  I ultimately decided to exchange his R internal jugular line over a wire.  As we were preparing to exchange the line his monitor showed signs of ST elevation. The exchange was uneventful, but as we were setting up for an EKG the patient arrested.  ACLS was initiated. The initial rhythm was PEA.  He received a total of 2 doses of epi and his chest tube was hooked back to suction.  ROSC was achieved after 2 rounds of ACLS.  His initial rhythm was sinus but then he developed Afib w/ a HR in the 150s.  His MAP dropped from the 60s into the 50s.  I decided to cardiovert him.  We performed synchronized cardioversion at 150J.  His rhythm converted to sinus tach and his rate dropped from 150 to 100.  A repeat EKG showed ST elevation and cardiology was consulted.  A CXR was performed and did not show any evidence of pneumothorax.  A complete set of labs was sent, including CMP, CBC, lactate, troponin.  He was given 1 liter of crystalloid without improvement in his BP. Vaso was added.  By the end of the event the ST elevation had resolved.   - Per cardiology, will consider amio drip if he develops Afib again.  Will plan for an echo when able - We returned him to full support on the vent. - Will hold on resuming CRRT for now - Will follow up his labs  - Patient's wife, Mindi Junker, was notified of the event and his change in clinical status. She plans to come to the hospital within the next few hours.  I performed more than 60 minutes of critical care time, including the leading of a code event, the ordering of medications and labs with interpretation, and ongoing monitoring of the patient.   Melody Haver, MD   [facility], [place]

## 2023-05-25 NOTE — Progress Notes (Signed)
Cardiology was urgently called status post code on this patient with PEA arrest  Patient got exchange of his right IJ line over a wire, telemetry showed ST elevations, EKG was obtained and showed atrial fibrillation with RVR, and acute ST changes in inferior leads, team was concerned and called cardiology. Patient also was very unstable needing urgent cardioversion.  I reviewed the EKGs from earlier today there are 2 EKGs both show atrial fibrillation with right bundle branch blockAnd some ST-T wave changes which are nonspecific in the anterior leads and then mild ST elevation/changes in inferior leads, repeat EKG after cardioversion showsSinus tachycardia without any acute ST-T wave changes.   Patient critically ill with polytrauma, skull fracture, subdural hematoma, subarachnoid hemorrhage, AKI needing CRRT, CRRT is ongoing, multiple trauma, anemia, intubated and sedated for acute hypoxic respiratory failure, right pneumothorax status post pigtail.  Currently he is on Levophed. 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  -Critically ill patient with ongoing CRRT, metabolic acidosis, With some ST-T wave changes during line exchange (right IJ)-and these changes resolved currently in sinus tachycardia, status post A-fib status post cardioversion,  -Would recommend serial EKG in a.m., get echocardiogram in a.m. -If there is any acute EKG changes call is again, patient is a poor candidate for any invasive procedure given critically ill, unstable, multiple injuries, subdural hematoma, subarachnoid hemorrhage

## 2023-05-25 NOTE — Death Summary Note (Signed)
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 concurrent CT chest. IMPRESSION: 1. No evidence of acute traumatic injury to the major arteries of the neck. 2. Skull fracture and facial bones fractures described on concurrent CT head and maxillofacial. 3. Rib fractures, subcutaneous emphysema and pneumothorax described on concurrent CT chest. These results were called by telephone at the time of interpretation on 04/26/2023 at 2:10 pm to provider Kris Mouton , who verbally acknowledged these results. Electronically Signed   By: Baldemar Lenis M.D.   On: 04/26/2023 14:40   CT  HEAD WO CONTRAST Result Date: 04/26/2023 CLINICAL DATA:  Head trauma, moderate to severe. EXAM: CT HEAD WITHOUT CONTRAST CT MAXILLOFACIAL WITHOUT CONTRAST CT CERVICAL SPINE WITHOUT CONTRAST TECHNIQUE: Multidetector CT imaging of the head, cervical spine, and maxillofacial structures were performed using the standard protocol without intravenous contrast. Multiplanar CT image reconstructions of the cervical spine and maxillofacial structures were also generated. RADIATION DOSE REDUCTION: This exam was performed according to the departmental dose-optimization program which includes automated exposure control, adjustment of the mA and/or kV according to patient size and/or use of iterative reconstruction technique. COMPARISON:  Head and C-spine CT November 29, 2021. FINDINGS: CT HEAD FINDINGS Brain: Prominent subarachnoid hemorrhage with associated subdural hemorrhage along the right cerebral convexity and left frontal region, as well as along the anterior falx with mild mass effect on the adjacent brain parenchyma. Approximately 2 mm leftward midline shift. Small subarachnoid hemorrhage are also seen in the interpeduncular cistern and ambient cistern on the left. Hypodensity of the right greater than left anterior temporal lobes, consistent with contusions. No hydrocephalus or mass lesion. Incidental lipoma of the corpus callosum. Vascular: No hyperdense vessel. Skull: Right lateral longitudinal fracture extending from the sphenoid wing to the right parieto-occipital region where there is a minimally displaced bone fragment. Transverse right temporal bone fracture through the mastoid with associated effusion. The ossicles and optic capsule appear intact. The left mastoid is preserved. Facial fractures described below. Other: Prominent right scalp hematoma and with scattered emphysema. CT MAXILLOFACIAL FINDINGS Osseous: Nondisplaced fractures of the right zygomatic arch and right lateral orbital rim with minimal  extension to the lateral aspect of the right orbital floor. Bilateral minimally displaced nasal bone fractures. Other facial structures appear preserved. Orbits: Intraorbital structures appear preserved. Sinuses: Fluid  within the ethmoid cells and sphenoid sinuses likely related to intubation. Soft tissues: Negative. CT CERVICAL SPINE FINDINGS Alignment: Trace degenerative retrolisthesis of C3 over C4. No traumatic malalignment. Skull base and vertebrae: No acute fracture. No primary bone lesion or focal pathologic process. Soft tissues and spinal canal: No prevertebral fluid or swelling. No visible canal hematoma. Disc levels: Advanced degenerative changes of the cervical spine with at least mild spinal canal stenosis at C5-6 and multilevel high-grade neural foraminal narrowing, which appears similar to prior CT. Upper chest: Rib fractures and right-sided pneumothorax are described at on concurrent CT of the chest. Other: None. IMPRESSION: 1. Prominent subarachnoid hemorrhage with associated subdural hemorrhage along the right cerebral convexity and left frontal region, as well as along the anterior falx with mild mass effect on the adjacent brain parenchyma. Approximately 2 mm leftward midline shift. 2. Hypodensity of the right greater than left anterior temporal lobes, consistent with contusions. 3. Right lateral longitudinal fracture extending from the sphenoid wing to the right parieto-occipital region where there is a minimally displaced bone fragment. 4. Transverse right temporal bone fracture through the mastoid with associated effusion. The ossicles and optic capsule appear intact. 5. Right zygomatic maxillary complex, as detailed above. 6. Bilateral minimally displaced nasal bone fractures. 7. No acute fracture or traumatic malalignment of the cervical spine. These results were called by telephone at the time of interpretation on 04/26/2023 at 2:10 pm to provider Kris Mouton , who verbally acknowledged  these results. Electronically Signed   By: Baldemar Lenis M.D.   On: 04/26/2023 14:30   CT Maxillofacial Wo Contrast Result Date: 04/26/2023 CLINICAL DATA:  Head trauma, moderate to severe. EXAM: CT HEAD WITHOUT CONTRAST CT MAXILLOFACIAL WITHOUT CONTRAST CT CERVICAL SPINE WITHOUT CONTRAST TECHNIQUE: Multidetector CT imaging of the head, cervical spine, and maxillofacial structures were performed using the standard protocol without intravenous contrast. Multiplanar CT image reconstructions of the cervical spine and maxillofacial structures were also generated. RADIATION DOSE REDUCTION: This exam was performed according to the departmental dose-optimization program which includes automated exposure control, adjustment of the mA and/or kV according to patient size and/or use of iterative reconstruction technique. COMPARISON:  Head and C-spine CT November 29, 2021. FINDINGS: CT HEAD FINDINGS Brain: Prominent subarachnoid hemorrhage with associated subdural hemorrhage along the right cerebral convexity and left frontal region, as well as along the anterior falx with mild mass effect on the adjacent brain parenchyma. Approximately 2 mm leftward midline shift. Small subarachnoid hemorrhage are also seen in the interpeduncular cistern and ambient cistern on the left. Hypodensity of the right greater than left anterior temporal lobes, consistent with contusions. No hydrocephalus or mass lesion. Incidental lipoma of the corpus callosum. Vascular: No hyperdense vessel. Skull: Right lateral longitudinal fracture extending from the sphenoid wing to the right parieto-occipital region where there is a minimally displaced bone fragment. Transverse right temporal bone fracture through the mastoid with associated effusion. The ossicles and optic capsule appear intact. The left mastoid is preserved. Facial fractures described below. Other: Prominent right scalp hematoma and with scattered emphysema. CT MAXILLOFACIAL  FINDINGS Osseous: Nondisplaced fractures of the right zygomatic arch and right lateral orbital rim with minimal extension to the lateral aspect of the right orbital floor. Bilateral minimally displaced nasal bone fractures. Other facial structures appear preserved. Orbits: Intraorbital structures appear preserved. Sinuses: Fluid within the ethmoid cells and sphenoid sinuses likely related to intubation. Soft tissues: Negative. CT CERVICAL SPINE FINDINGS Alignment: Trace degenerative retrolisthesis of C3 over C4. No  traumatic malalignment. Skull base and vertebrae: No acute fracture. No primary bone lesion or focal pathologic process. Soft tissues and spinal canal: No prevertebral fluid or swelling. No visible canal hematoma. Disc levels: Advanced degenerative changes of the cervical spine with at least mild spinal canal stenosis at C5-6 and multilevel high-grade neural foraminal narrowing, which appears similar to prior CT. Upper chest: Rib fractures and right-sided pneumothorax are described at on concurrent CT of the chest. Other: None. IMPRESSION: 1. Prominent subarachnoid hemorrhage with associated subdural hemorrhage along the right cerebral convexity and left frontal region, as well as along the anterior falx with mild mass effect on the adjacent brain parenchyma. Approximately 2 mm leftward midline shift. 2. Hypodensity of the right greater than left anterior temporal lobes, consistent with contusions. 3. Right lateral longitudinal fracture extending from the sphenoid wing to the right parieto-occipital region where there is a minimally displaced bone fragment. 4. Transverse right temporal bone fracture through the mastoid with associated effusion. The ossicles and optic capsule appear intact. 5. Right zygomatic maxillary complex, as detailed above. 6. Bilateral minimally displaced nasal bone fractures. 7. No acute fracture or traumatic malalignment of the cervical spine. These results were called by  telephone at the time of interpretation on 04/26/2023 at 2:10 pm to provider Kris Mouton , who verbally acknowledged these results. Electronically Signed   By: Baldemar Lenis M.D.   On: 04/26/2023 14:30   CT CERVICAL SPINE WO CONTRAST Result Date: 04/26/2023 CLINICAL DATA:  Head trauma, moderate to severe. EXAM: CT HEAD WITHOUT CONTRAST CT MAXILLOFACIAL WITHOUT CONTRAST CT CERVICAL SPINE WITHOUT CONTRAST TECHNIQUE: Multidetector CT imaging of the head, cervical spine, and maxillofacial structures were performed using the standard protocol without intravenous contrast. Multiplanar CT image reconstructions of the cervical spine and maxillofacial structures were also generated. RADIATION DOSE REDUCTION: This exam was performed according to the departmental dose-optimization program which includes automated exposure control, adjustment of the mA and/or kV according to patient size and/or use of iterative reconstruction technique. COMPARISON:  Head and C-spine CT November 29, 2021. FINDINGS: CT HEAD FINDINGS Brain: Prominent subarachnoid hemorrhage with associated subdural hemorrhage along the right cerebral convexity and left frontal region, as well as along the anterior falx with mild mass effect on the adjacent brain parenchyma. Approximately 2 mm leftward midline shift. Small subarachnoid hemorrhage are also seen in the interpeduncular cistern and ambient cistern on the left. Hypodensity of the right greater than left anterior temporal lobes, consistent with contusions. No hydrocephalus or mass lesion. Incidental lipoma of the corpus callosum. Vascular: No hyperdense vessel. Skull: Right lateral longitudinal fracture extending from the sphenoid wing to the right parieto-occipital region where there is a minimally displaced bone fragment. Transverse right temporal bone fracture through the mastoid with associated effusion. The ossicles and optic capsule appear intact. The left mastoid is preserved.  Facial fractures described below. Other: Prominent right scalp hematoma and with scattered emphysema. CT MAXILLOFACIAL FINDINGS Osseous: Nondisplaced fractures of the right zygomatic arch and right lateral orbital rim with minimal extension to the lateral aspect of the right orbital floor. Bilateral minimally displaced nasal bone fractures. Other facial structures appear preserved. Orbits: Intraorbital structures appear preserved. Sinuses: Fluid within the ethmoid cells and sphenoid sinuses likely related to intubation. Soft tissues: Negative. CT CERVICAL SPINE FINDINGS Alignment: Trace degenerative retrolisthesis of C3 over C4. No traumatic malalignment. Skull base and vertebrae: No acute fracture. No primary bone lesion or focal pathologic process. Soft tissues and spinal canal: No prevertebral fluid or  swelling. No visible canal hematoma. Disc levels: Advanced degenerative changes of the cervical spine with at least mild spinal canal stenosis at C5-6 and multilevel high-grade neural foraminal narrowing, which appears similar to prior CT. Upper chest: Rib fractures and right-sided pneumothorax are described at on concurrent CT of the chest. Other: None. IMPRESSION: 1. Prominent subarachnoid hemorrhage with associated subdural hemorrhage along the right cerebral convexity and left frontal region, as well as along the anterior falx with mild mass effect on the adjacent brain parenchyma. Approximately 2 mm leftward midline shift. 2. Hypodensity of the right greater than left anterior temporal lobes, consistent with contusions. 3. Right lateral longitudinal fracture extending from the sphenoid wing to the right parieto-occipital region where there is a minimally displaced bone fragment. 4. Transverse right temporal bone fracture through the mastoid with associated effusion. The ossicles and optic capsule appear intact. 5. Right zygomatic maxillary complex, as detailed above. 6. Bilateral minimally displaced nasal bone  fractures. 7. No acute fracture or traumatic malalignment of the cervical spine. These results were called by telephone at the time of interpretation on 04/26/2023 at 2:10 pm to provider Kris Mouton , who verbally acknowledged these results. Electronically Signed   By: Baldemar Lenis M.D.   On: 04/26/2023 14:30   CT CHEST ABDOMEN PELVIS W CONTRAST Result Date: 04/26/2023 CLINICAL DATA:  Level 1 trauma. EXAM: CT CHEST, ABDOMEN, AND PELVIS WITH CONTRAST TECHNIQUE: Multidetector CT imaging of the chest, abdomen and pelvis was performed following the standard protocol during bolus administration of intravenous contrast. RADIATION DOSE REDUCTION: This exam was performed according to the departmental dose-optimization program which includes automated exposure control, adjustment of the mA and/or kV according to patient size and/or use of iterative reconstruction technique. CONTRAST:  175 cc Omnipaque 350 COMPARISON:  11/29/2021 chest CT angiogram and CT abdomen/pelvis. FINDINGS: CT CHEST FINDINGS Cardiovascular: Mild cardiomegaly. No significant pericardial fluid/thickening. Atherosclerotic thoracic aorta with dilated 4.0 cm ascending thoracic aorta. No evidence of acute intramural hematoma, dissection or pseudoaneurysm of the thoracic aorta. Patent aortic arch branch vessels. Normal caliber pulmonary arteries. No central pulmonary emboli. Mediastinum/Nodes: Minimal scattered foci of pneumomediastinum in the posterior right pericardial and high right internal mammary regions. No mediastinal hematoma. No discrete thyroid nodules. Unremarkable esophagus. No axillary, mediastinal or hilar lymphadenopathy. Lungs/Pleura: Persistent moderate anterior right pneumothorax with well-positioned right pigtail chest tube terminating in the posterior mid to lower right pleural space. No left pneumothorax. No pleural effusion. Endotracheal tube tip is 3.7 cm above the carina. New complete left lower lobe atelectasis.  Curvilinear air-filled tracts with peripheral mild consolidation in the right upper lobe (series 37/image 134) and peripheral right middle lobe on series 37/image 186 suspicious for pulmonary lacerations. Mild-to-moderate right lung atelectasis, most prominent in the dependent right lower lobe. No discrete lung masses or significant pulmonary nodules in the aerated portions of the lungs. Musculoskeletal: No aggressive appearing focal osseous lesions. Probable nondisplaced posterior right fifth rib fracture. Subcutaneous emphysema throughout the deep ventral right chest wall. Mild thoracic spondylosis. CT ABDOMEN PELVIS FINDINGS Hepatobiliary: Normal liver with no liver laceration or mass. Cholelithiasis. No gallbladder wall thickening. Pancreas: Normal, with no laceration, mass or duct dilation. Spleen: Normal size. No laceration or mass. Adrenals/Urinary Tract: Normal adrenals. No hydronephrosis. Nonobstructing 5 mm lower right renal stone. Simple 1.4 cm posterior lower left renal cyst, for which no follow-up imaging is recommended. No renal laceration. Normal bladder. Stomach/Bowel: Grossly normal stomach. Normal caliber small bowel with no small bowel wall thickening. Normal  appendix. Marked colonic diverticulosis, most prominent in the sigmoid colon, with no large bowel wall thickening or significant pericolonic fat stranding. Vascular/Lymphatic: Atherosclerotic nonaneurysmal abdominal aorta. Patent portal, splenic, hepatic and renal veins. Left common femoral central venous catheter terminates in the left external iliac vein. No pathologically enlarged lymph nodes in the abdomen or pelvis. Reproductive: Top-normal size prostate. Other: No pneumoperitoneum, ascites or focal fluid collection. Musculoskeletal: No aggressive appearing focal osseous lesions. No fracture in the abdomen or pelvis. Mild lumbar spondylosis. IMPRESSION: 1. Persistent moderate anterior right pneumothorax with well-positioned right pigtail  chest tube terminating in the posterior mid to lower right pleural space. 2. Curvilinear air-filled tracts with peripheral mild consolidation in the right upper lobe and right middle lobe suspicious for pulmonary lacerations. 3. Near complete left lower lobe atelectasis. Mild-to-moderate right lung atelectasis, most prominent in the dependent right lower lobe. 4. Minimal scattered foci of pneumomediastinum in the posterior right pericardial and high right internal mammary regions. Subcutaneous emphysema in the ventral right chest wall. 5. Probable nondisplaced posterior right fifth rib fracture. 6. No evidence of acute traumatic injury in the abdomen or pelvis. 7. Cholelithiasis. 8. Nonobstructing right nephrolithiasis. 9. Marked colonic diverticulosis, most prominent in the sigmoid colon. 10.  Aortic Atherosclerosis (ICD10-I70.0). Electronically Signed   By: Delbert Phenix M.D.   On: 04/26/2023 14:22   DG Chest Port 1 View Result Date: 04/26/2023 CLINICAL DATA:  Trauma EXAM: PORTABLE CHEST 1 VIEW COMPARISON:  11/29/2021. FINDINGS: Endotracheal tube is seen with its tip approximately 3.1 cm above the carina. Limited visualization of bilateral lungs however, no frank pneumothorax seen. There is a pleural drainage catheter overlying the right lower lung zone. There are nonspecific opacities overlying the right mid lung zone, which may represent lung contusion, pneumonitis or atelectasis. Stable cardio-mediastinal silhouette. No acute displaced rib fracture seen. The soft tissues are within normal limits. IMPRESSION: *Endotracheal tube is seen with its tip approximately 3.1 cm above the carina. *Right-sided pleural drainage catheter noted. *Nonspecific heterogeneous opacity overlying the right mid lung zone, which may represent lung contusion, pneumonitis or atelectasis. No discrete pneumothorax seen. No displaced rib fracture seen. Please note, patient is scheduled to undergo CT scan chest abdomen and pelvis.  Critical Value/emergent results were called by telephone at the time of interpretation on 04/26/2023 at 1:43 pm to provider Dr. Bedelia Person, who verbally acknowledged these results. Electronically Signed   By: Jules Schick M.D.   On: 04/26/2023 13:43   DG Pelvis Portable Result Date: 04/26/2023 CLINICAL DATA:  Trauma. EXAM: PORTABLE PELVIS 1-2 VIEWS COMPARISON:  None Available. FINDINGS: Please note only the portion of pelvis is included in the film. No acute displaced fracture seen. No radiopaque foreign bodies. IMPRESSION: Limited but grossly unremarkable exam. Electronically Signed   By: Jules Schick M.D.   On: 04/26/2023 13:39    Microbiology No results found for this or any previous visit (from the past 240 hours).  Lab Basic Metabolic Panel: No results for input(s): "NA", "K", "CL", "CO2", "GLUCOSE", "BUN", "CREATININE", "CALCIUM", "MG", "PHOS" in the last 168 hours. Liver Function Tests: No results for input(s): "AST", "ALT", "ALKPHOS", "BILITOT", "PROT", "ALBUMIN" in the last 168 hours. No results for input(s): "LIPASE", "AMYLASE" in the last 168 hours. No results for input(s): "AMMONIA" in the last 168 hours. CBC: No results for input(s): "WBC", "NEUTROABS", "HGB", "HCT", "MCV", "PLT" in the last 168 hours. Cardiac Enzymes: No results for input(s): "CKTOTAL", "CKMB", "CKMBINDEX", "TROPONINI" in the last 168 hours. Sepsis Labs: No results for  input(s): "PROCALCITON", "WBC", "LATICACIDVEN" in the last 168 hours.  Procedures/Operations  CPT 20690-External fixation of right lower extremity CPT 11012-Irrigation and debridement of right open distal tibia fracture CPT 27752-Reduction of right tibial shaft fracture CPT 12044-Intermediate repair of right foot wounds approximately 10 cm in length CPT 12047-Intermediate repair of left hand wounds approximately 25cm in length CPT 11043 and 11046(x18)-Debridement of muscle and fascia left upper extremity (25x15cm in size=375 sq cm) CPT  97606-Wound vac placement to right leg (wound 21 cm x 6cm) CPT 97606-Wound vac placement to left arm Left arm brachial arteryexploration Forearm exploration 1.  Repeat debridement of open right tibia and fibular fractures with removal of skin, subcutaneous tissue, fascia, and bone. 2.  Revision of external fixation right lower extremity including placement of new transcalcaneal pin and additional metatarsal pins. 3.  Insertion of intramedullary rod right tibia. 4.  Insertion of antibiotic cement spacer right tibia. 5.  Debridement of extensive soft tissue wounds left upper extremity, arm and forearm including skin, subcutaneous tissue, and fascia. 6.  Incision and drainage of seroma left upper arm. 7.  Wound VAC dressing change under anesthesia, left arm. 8.  Wound VAC dressing change under anesthesia, right leg. Excision of Left arm wound 20 x 20 cm skin and soft tissue 2.   VAC change to left forearm 10 x 20 cm 3.   Myriad placement to left arm 20 x 20 cm sheet and 2 gm powder 4.   VAC placement to left arm 5.   Partial closure of right leg wound 2 cm 6.   Right leg placement of Myriad sheet 10 x 10 cm and 2 gm powder 7.   Right leg VAC placement Intramedullary nailing of the right tibia using a Synthes 8 mm x 405 mm statically locked nail. 2.  Removal of external fixator, right tibia. 3.  Removal and replacement of antibiotic cement spacer.    Lennie Odor Riannah Stagner 05/15/2023, 4:06 PM

## 2023-05-25 NOTE — Consult Note (Signed)
Cardiology Consultation   Patient ID: Paul Mitchell MRN: [id number]; DOB: 01-29-1951  Admit date: 04/26/2023 Date of Consult: 05/22/2023  PCP:  Marina Goodell, MD   [facility] Providers Cardiologist:  None        Patient Profile:   Paul Mitchell is a 73 y.o. male with a hx of Paroxysmal afib, h/o PE s/p thrombectomy 11/2021,  who is being seen 05/23/2023 for the evaluation of EKG changes at the request of Trauma team.  History of Present Illness:   Paul Mitchell is a 73 y.o. male with a hx of Paroxysmal afib, h/o PE s/p thrombectomy 11/2021,  who is being seen 05/08/2023 for the evaluation of EKG changes   Cardiology urgently called in due to EKG changes (happened after right internal jugular line change over a wire). Per report Monitor showed some ST elevation  Pt had ongoing CRRT, he is very sick with poly trauma, SDH/SAH, multiple fractures, skull fractures,pneumomediastinum, s/p pigtail for pneumothroax, rib #, pulm lacerations etc Currently intubated and sedated EKG was obtained and showed atrial fibrillation with RVR, and acute ST changes in inferior leads- injury pattern, team was concerned and called cardiology. Patient also was very unstable needing urgent cardioversion.  I reviewed the EKGs from earlier today there are 2 EKGs both show atrial fibrillation with right bundle branch blockAnd some ST-T wave changes which are nonspecific in the anterior leads and then mild ST elevation/changes in inferior leads, repeat EKG after cardioversion showsSinus tachycardia without any acute ST-T wave changes.   No prior ekgs ava ible   History reviewed. No pertinent past medical history.  Past Surgical History:  Procedure Laterality Date   APPLICATION OF WOUND VAC Left 04/26/2023   Procedure: APPLICATION OF WOUND VAC;  Surgeon: Luci Bank, MD;  Location: [facility] OR;  Service: Orthopedics;  Laterality: Left;   EXCISIONAL TOTAL KNEE ARTHROPLASTY WITH  ANTIBIOTIC SPACERS Right 04/29/2023   Procedure: PLACEMENT OF ANTIBIOTIC SPACER RIGHT LOWER LEG;  Surgeon: Myrene Galas, MD;  Location: [facility] OR;  Service: Orthopedics;  Laterality: Right;   EXTERNAL FIXATION LEG Right 04/26/2023   Procedure: EXTERNAL FIXATION LEG;  Surgeon: Luci Bank, MD;  Location: [facility];  Service: Orthopedics;  Laterality: Right;   EXTERNAL FIXATION LEG Right 04/29/2023   Procedure: REVISION OF EXTERNAL FIXATION LEG;  Surgeon: Myrene Galas, MD;  Location: [facility];  Service: Orthopedics;  Laterality: Right;   I & D EXTREMITY Right 04/29/2023   Procedure: IRRIGATION AND DEBRIDEMENT RIGHT LOWER EXTREMITY;  Surgeon: Myrene Galas, MD;  Location: [facility];  Service: Orthopedics;  Laterality: Right;   INCISION AND DRAINAGE OF WOUND Left 04/29/2023   Procedure: IRRIGATION AND DEBRIDEMENT LEFT UPPER EXTREMITY;  Surgeon: Myrene Galas, MD;  Location: [facility];  Service: Orthopedics;  Laterality: Left;   MINOR APPLICATION OF WOUND VAC Right 04/26/2023   Procedure: MINOR APPLICATION OF WOUND VAC;  Surgeon: Luci Bank, MD;  Location: [facility];  Service: Orthopedics;  Laterality: Right;   WOUND EXPLORATION Left 04/26/2023   Procedure: WOUND EXPLORATION LEFT UPPER EXTREMITY;  Surgeon: Victorino Sparrow, MD;  Location: [facility] OR;  Service: Vascular;  Laterality: Left;   WOUND EXPLORATION Left 04/26/2023   Procedure: I&D W/WOUND EXPLORATION LEFT EXTREMITY;  Surgeon: Luci Bank, MD;  Location: [facility] OR;  Service: Orthopedics;  Laterality: Left;     Home Medications:  Prior to Admission medications   Medication Sig Start Date End Date Taking? Authorizing Provider  apixaban (ELIQUIS) 5  MG TABS tablet Take 5 mg by mouth 2 (two) times daily.   Yes [provider]  aspirin EC 81 MG tablet Take 81 mg by mouth daily. Swallow whole.   Yes [provider]  pantoprazole (PROTONIX) 20 MG tablet Take 20 mg by mouth daily.   Yes [provider]    Inpatient Medications: Scheduled  Meds:  acetaminophen  1,000 mg Per Tube Q6H   Chlorhexidine Gluconate Cloth  6 each Topical Daily   docusate  100 mg Per Tube BID   insulin aspart  0-15 Units Subcutaneous Q4H   levETIRAcetam  750 mg Per Tube BID   methocarbamol  750 mg Per Tube TID   multivitamin with minerals  1 tablet Per Tube Daily   mupirocin ointment  1 Application Nasal BID   mouth rinse  15 mL Mouth Rinse Q2H   pantoprazole (PROTONIX) IV  40 mg Intravenous Q24H   polyethylene glycol  17 g Per Tube BID   sodium chloride flush  10-40 mL Intracatheter Q12H   vasopressin       Continuous Infusions:  sodium chloride 0 mL (04/27/23 1755)   feeding supplement (PIVOT 1.5 CAL) Stopped (04/28/2023 0020)   fentaNYL infusion INTRAVENOUS Stopped (05/01/23 0801)   heparin 750 Units/hr (05/11/2023 0200)   norepinephrine (LEVOPHED) Adult infusion 34 mcg/min (05/22/2023 0338)   PrismaSol BGK 2/3.5 400 mL/hr at 05/05/23 1100   PrismaSol BGK 2/3.5 400 mL/hr at 05/05/23 1152   PrismaSol BGK 2/3.5 2,000 mL/hr at 05/05/23 2023   vasopressin 0.03 Units/min (05/14/2023 0200)   PRN Meds: sodium chloride, fentaNYL, fentaNYL (SUBLIMAZE) injection, fentaNYL (SUBLIMAZE) injection, heparin, hydrALAZINE, metoprolol tartrate, ondansetron **OR** ondansetron (ZOFRAN) IV, mouth rinse, oxyCODONE, polyethylene glycol, sodium chloride, sodium chloride flush, vasopressin  Allergies:   No Known Allergies  Social History:   Social History   Socioeconomic History   Marital status: Married    Spouse name: Not on file   Number of children: Not on file   Years of education: Not on file   Highest education level: Not on file  Occupational History   Not on file  Tobacco Use   Smoking status: Not on file   Smokeless tobacco: Not on file  Substance and Sexual Activity   Alcohol use: Not on file   Drug use: Not on file   Sexual activity: Not on file  Other Topics Concern   Not on file  Social History Narrative   Not on file   Social Drivers of  Health   Financial Resource Strain: Not on file  Food Insecurity: Not on file  Transportation Needs: Not on file  Physical Activity: Not on file  Stress: Not on file  Social Connections: Not on file  Intimate Partner Violence: Not on file    Family History:   History reviewed. No pertinent family history.   ROS:  Please see the history of present illness.   All other ROS reviewed and negative.     Physical Exam/Data:   Vitals:   05/23/2023 0145 05/19/2023 0200 05/16/2023 0203 05/09/2023 0330  BP: (!) 85/51 (!) 94/49    Pulse: (!) 110 (!) 117 (!) 118   Resp: (!) 33 (!) 32 (!) 32   Temp:      TempSrc:      SpO2: 99% 98% 100% 100%  Weight:      Height:        Intake/Output Summary (Last 24 hours) at 05/10/2023 0459 Last data filed at 05/08/2023 0200  Gross per 24 hour  Intake 2375.54 ml  Output 2583.3 ml  Net -207.76 ml      05/05/2023    5:00 AM 05/04/2023    5:00 AM 05/02/2023    5:00 AM  Last 3 Weights  Weight (lbs) 207 lb 7.3 oz 214 lb 1.1 oz 222 lb 3.6 oz  Weight (kg) 94.1 kg 97.1 kg 100.8 kg     Body mass index is 28.13 kg/m.  General:  intubated and sedated HEENT: normal Neck: no JVD Vascular: No carotid bruits; Distal pulses 2+ bilaterally Cardiac:  tachycardic Lungs:  clear to auscultation bilaterally, no wheezing, rhonchi or rales  Abd: soft, nontender, no hepatomegaly  Ext: no edema Musculoskeletal:  No deformities, BUE and BLE strength normal and equal Skin: warm and dry  Neuro:  intubated and sedated    Relevant CV Studies: --  Laboratory Data:  High Sensitivity Troponin:   Recent Labs  Lab 05/22/2023 0040  TROPONINIHS 69*     Chemistry Recent Labs  Lab 05/04/23 0406 05/04/23 1536 05/05/23 0508 05/05/23 1616 05/04/2023 0040 05/23/2023 0105  NA 137   < > 136 135 134* 134*  K 4.4   < > 4.1 4.1 4.6 4.7  CL 99   < > 102 100 99  --   CO2 23   < > 25 23 18*  --   GLUCOSE 164*   < > 161* 166* 319*  --   BUN 56*   < > 55* 55* 63*  --   CREATININE  2.40*   < > 2.26* 2.26* 2.73*  --   CALCIUM 9.5   < > 9.4 9.2 8.9  --   MG 2.6*  --  2.5*  --  3.0*  --   GFRNONAA 28*   < > 30* 30* 24*  --   ANIONGAP 15   < > 9 12 17*  --    < > = values in this interval not displayed.    Recent Labs  Lab 05/05/23 0508 05/05/23 1616 05/11/2023 0040  PROT  --   --  5.0*  ALBUMIN 1.7* 1.6* <1.5*  AST  --   --  179*  ALT  --   --  69*  BILITOT  --   --  0.9   Lipids  Recent Labs  Lab 05/03/23 0545  TRIG 49    Hematology Recent Labs  Lab 05/04/23 0406 05/05/23 0508 05/03/2023 0040 05/05/2023 0105  WBC 15.6* 23.9* 35.0*  --   RBC 3.18* 3.22* 3.10*  --   HGB 9.4* 9.5* 9.1* 9.2*  HCT 29.5* 29.9* 29.8* 27.0*  MCV 92.8 92.9 96.1  --   MCH 29.6 29.5 29.4  --   MCHC 31.9 31.8 30.5  --   RDW 16.7* 16.9* 17.0*  --   PLT 164 172 182  --    Thyroid No results for input(s): "TSH", "FREET4" in the last 168 hours.  BNPNo results for input(s): "BNP", "PROBNP" in the last 168 hours.  DDimer No results for input(s): "DDIMER" in the last 168 hours.   Radiology/Studies:  DG Chest Portable 1 View Result Date: 05/14/2023 CLINICAL DATA:  Catheter exchange. EXAM: PORTABLE CHEST 1 VIEW COMPARISON:  Portable chest yesterday at 11:17 a.m. FINDINGS: 12:33 a.m. ETT tip is 2.2 cm from the carina. NGT loops towards to the left and cephalad with the tip in the proximal fundal area. Right IJ double-lumen catheter tip is again seen in the distal SVC, right PICC terminates  just above the superior cavoatrial junction and a right pigtail chest tube was stably positioned with no visible pneumothorax. Multiple overlying monitor wires. The cardiomediastinal silhouette is stable. No vascular congestion is seen. There is calcification of the transverse aorta. The lungs are clear of infiltrates. Thoracic spondylosis. Soft tissue emphysema along the outer right upper and lower chest wall has increased. No changes in the overall aeration. IMPRESSION: 1. No visible pneumothorax with  right chest tube in place. 2. Increased soft tissue emphysema along the outer right upper and lower chest wall. 3. No other changes.  No appreciable infiltrate. Electronically Signed   By: Almira Bar M.D.   On: 05/20/2023 01:44   DG CHEST PORT 1 VIEW Result Date: 05/05/2023 CLINICAL DATA:  No proximal right. EXAM: PORTABLE CHEST 1 VIEW COMPARISON:  Chest radiograph dated 05/05/2023. FINDINGS: Support apparatus in similar position. Similar appearance of bilateral mid to lower lung field streaky densities. No pleural effusion. No detectable pneumothorax. Stable cardiac silhouette. No acute osseous pathology. IMPRESSION: 1. Support apparatus in similar position. 2. Similar appearance of bilateral mid to lower lung field streaky densities. No pneumothorax. Electronically Signed   By: Elgie Collard M.D.   On: 05/05/2023 15:03   DG CHEST PORT 1 VIEW Result Date: 05/05/2023 CLINICAL DATA:  73 year old male with hypoxia. EXAM: PORTABLE CHEST 1 VIEW COMPARISON:  Portable chest yesterday and earlier. FINDINGS: Portable AP semi upright view at 0335 hours. Stable lines and tubes. Mildly improved lung volumes. Mediastinal contours remain within normal limits. Right chest tube in place with no pneumothorax or pleural effusion identified. Stable ventilation, no consolidation or edema. Stable visualized osseous structures.  Negative visible bowel gas. IMPRESSION: 1. Stable lines and tubes. 2. Mildly improved lung volumes with otherwise stable ventilation. No right pneumothorax identified now. Electronically Signed   By: Odessa Fleming M.D.   On: 05/05/2023 05:36   DG CHEST PORT 1 VIEW Result Date: 05/04/2023 CLINICAL DATA:  Pneumothorax. EXAM: PORTABLE CHEST 1 VIEW COMPARISON:  Chest radiograph dated 05/03/2023. FINDINGS: Support apparatus in stable position. Evaluation of the right upper lobe pneumothorax is limited due to patient positioning and technique. A small right apical pneumothorax relatively similar to prior  radiograph suspected. There are bibasilar atelectasis. Stable cardiac silhouette. Atherosclerotic calcification of the aorta. No acute osseous pathology. IMPRESSION: Small right apical pneumothorax relatively similar to prior radiograph. Electronically Signed   By: Elgie Collard M.D.   On: 05/04/2023 16:21   EEG adult Result Date: 05/03/2023 Charlsie Quest, MD     05/03/2023  8:57 PM Patient Name: Job Holtsclaw MRN: [id number] Epilepsy Attending: Charlsie Quest Referring Physician/Provider: Diamantina Monks, MD Date: 05/03/2023 Duration: 28.03 mins Patient history: 73yo M with diffuse SAH, right and left SDH and bitemporal lobe contusion. EEG to evaluate for seizure. Level of alertness: Awake/ lethargic AEDs during EEG study: LEV Technical aspects: This EEG study was done with scalp electrodes positioned according to the 10-20 International system of electrode placement. Electrical activity was reviewed with band pass filter of 1-70Hz , sensitivity of 7 uV/mm, display speed of 58mm/sec with a 60Hz  notched filter applied as appropriate. EEG data were recorded continuously and digitally stored.  Video monitoring was available and reviewed as appropriate. Description: EEG showed continuous generalized and lateralized right hemisphere 3 to 6 Hz theta-delta slowing admixed with 12-14Hz  beta activity. Sharp waves were noted in left fronto-temporal region, at times qasi-periodic at 0.5-1hz . Hyperventilation and photic stimulation were not performed.   ABNORMALITY - Sharp wave,  left fronto-temporal region - Continuous slow, generalized and lateralized right hemisphere IMPRESSION: This study showed evidence of epileptogenicity arising from left fronto-temporal region. Additionally there is cortical dysfunction arising from right hemisphere likely secondary to underlying structural abnormality. Lastly there is moderate diffuse encephalopathy. No seizures were seen throughout the recording. Priyanka Annabelle Harman   CT  HEAD WO CONTRAST ( ) Result Date: 05/03/2023 CLINICAL DATA:  Head trauma, moderate-severe EXAM: CT HEAD WITHOUT CONTRAST TECHNIQUE: Contiguous axial images were obtained from the base of the skull through the vertex without intravenous contrast. RADIATION DOSE REDUCTION: This exam was performed according to the departmental dose-optimization program which includes automated exposure control, adjustment of the mA and/or kV according to patient size and/or use of iterative reconstruction technique. COMPARISON:  Head CT 05/01/2023 FINDINGS: Brain: Redemonstrated is a 6 mm left frontal convexity subdural hematoma unchanged from prior exam. Redemonstrated is a 8 mm right middle cranial fossa subdural hematoma, similar to prior exam there is diffuse subarachnoid hemorrhage along the bilateral cerebral convexities with layering blood products in the bilateral occipital horns the volume of intraventricular blood products is stable to slightly increased from prior exam. Hypodensities involving the anterior temporal lobes and inferomedial frontal lobes bilaterally likely represent posttraumatic contusions. There is minimal interval increase in size of the left temporal horn compared to prior exam. 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 No CT evidence of an acute cortical infarct. Vascular: No hyperdense vessel or unexpected calcification. Skull: Redemonstrated complex calvarial maxillofacial fractures as described previously Sinuses/Orbits: Small bilateral mastoid effusions, right-greater-than-left air-fluid levels in bilateral sphenoid sinuses. Orbits are unremarkable Other: None. IMPRESSION: 1. Stable 6 mm left frontal convexity and 8 mm right middle cranial fossa subdural hematomas. 2. Stable subarachnoid hemorrhage along the bilateral cerebral convexities. Stable to slightly increased volume of intraventricular blood products. Minimal interval increase in size of the left temporal  horn compared to prior exam 3. Stable hypodensities involving the anterior temporal lobes and inferomedial frontal lobes bilaterally likely represent posttraumatic contusions. 4. Redemonstrated complex calvarial and maxillofacial fractures as described previously. Electronically Signed   By: Lorenza Cambridge M.D.   On: 05/03/2023 11:56   DG CHEST PORT 1 VIEW Result Date: 05/03/2023 CLINICAL DATA:  Follow-up pneumothorax EXAM: PORTABLE CHEST 1 VIEW COMPARISON:  Film from earlier in the same day. FINDINGS: Right-sided pigtail catheter, endotracheal tube and nasogastric catheter are again seen and stable. Right PICC and temporary dialysis catheter in the right jugular vein are again seen. Minimal right apical pneumothorax is again noted stable from the recent exam. No new focal infiltrate or effusion is noted. Persistent right basilar atelectasis is seen. IMPRESSION: Stable appearance when compared with the earlier film. No new increase in pneumothorax is noted. Electronically Signed   By: Alcide Clever M.D.   On: 05/03/2023 10:23   DG CHEST PORT 1 VIEW Result Date: 05/03/2023 CLINICAL DATA:  Respiratory abnormality. EXAM: PORTABLE CHEST 1 VIEW COMPARISON:  May 01, 2023 FINDINGS: An endotracheal tube is seen with its distal tip approximately 2.9 cm from the carina. Stable enteric tube, right internal jugular venous catheter and right-sided chest tube positioning is noted. The heart size and mediastinal contours are within normal limits. Marked severity calcification of the aortic arch is seen. A small right apical pneumothorax is seen which is mildly decreased in size when compared to the prior study. Mild stable mid to lower right lung atelectatic changes are noted. No pleural effusion is seen. A stable amount of soft  tissue air is noted along the periphery of the right rib cage. The visualized skeletal structures are unremarkable. IMPRESSION: 1. Small right apical pneumothorax which is mildly decreased in size  when compared to the prior study. 2. Stable mid to lower right lung atelectatic changes. 3. Stable support apparatus. Electronically Signed   By: Aram Candela M.D.   On: 05/03/2023 02:13   DG Tibia/Fibula Right Port Result Date: 05/02/2023 CLINICAL DATA:  Postop. EXAM: PORTABLE RIGHT TIBIA AND FIBULA - 2 VIEW COMPARISON:  Preoperative radiograph 04/29/2023 FINDINGS: Tibial intramedullary nail with proximal and distal locking screw fixation traversing distal tibial fracture. The previous external fixator has been removed. Segmental fibular fracture again seen. Recent postsurgical change includes air and edema in the soft tissues. Wound VAC distally. IMPRESSION: ORIF of distal tibial fracture. Electronically Signed   By: Narda Rutherford M.D.   On: 05/02/2023 16:05   DG Tibia/Fibula Right Result Date: 05/02/2023 CLINICAL DATA:  Elective surgery. EXAM: RIGHT TIBIA AND FIBULA - 2 VIEW COMPARISON:  Radiographs 04/29/2023 FINDINGS: Previous intramedullary rod is been removed, subsequent tibial intramedullary nail with proximal and distal locking screw fixation. Portions of segmental fibular fracture or visualized. Fluoroscopy time 71.2 seconds. Dose 1.85 mGy. IMPRESSION: Intraoperative fluoroscopy during tibial ORIF. Electronically Signed   By: Narda Rutherford M.D.   On: 05/02/2023 16:00   DG C-Arm 1-60 Min-No Report Result Date: 05/02/2023 Fluoroscopy was utilized by the requesting physician.  No radiographic interpretation.   DG C-Arm 1-60 Min-No Report Result Date: 05/02/2023 Fluoroscopy was utilized by the requesting physician.  No radiographic interpretation.     Assessment and Plan:   Acute EKG changes (Inferior ST-T wave) (soon after Right internal jugular line replacement over a wire) CODED (PEA arrest) s/p ROSC Paroxysmal Afib RVR-> s/p emergent DCCV given instability Septic/vasoplegic shock Elevated trop- sec to above Ac hypoxic respiratory failure- intubated Poly trauma SDH/SAH Skull  fracture AKI on CRRT Multiple other comorbidities- h/o PE s/p thrombectomy, tibia/fibula ; Pneumothorax right s/p pigtail chest tube   Plan: - his EKG changes have resolved, currently sinus tachycardia. Unclear if the acute EKG changes (inferior injury pattern- per trauma team there were ST elevation, then went into PEA arrest and afib RVR with RBBB-> s/p CPR and Shock) ?was sec to a wire vs something else. He is also acidotic, ongoing CRRTl, had pneumomediastinum on CT 1/31.  Overall, very sick and pt poor candidate for urgent invasive work up given Subdural hematoma/subarachnoid bleeding  - continue to monitor on EKG, 1st trop is 69.  - obtain ECHO today - already on heparin gtt - ongoing CRRT  Will follow.  Risk Assessment/Risk Scores:          CHA2DS2-VASc Score =   atleast 2  This indicates a  % annual risk of stroke. The patient's score is based upon:           For questions or updates, please contact [facility] Please consult [email or web address] for contact info under    Signed, Elmon Kirschner, MD  04/28/2023 4:59 AM

## 2023-05-25 NOTE — Progress Notes (Signed)
Brief cardiology note: Seen this AM by Dr. Brayton Layman. Now transitioned to comfort care. Please reach out if cardiology can be of assistance.  Jodelle Red, MD, PhD, [facility]  [facility]  [facility] [address] [place], [place] [address] [phone]

## 2023-05-25 NOTE — Progress Notes (Signed)
Patient ID: Paul Mitchell, male   DOB: 05-02-1950, 73 y.o.   MRN: [id number] Overnight events noted with cardiac arrest surrounding replacement of right IJ dialysis catheter.  He was resuscitated successfully and now with plans noted for transition to comfort care.  Terminal extubation and morphine drip noted.  [name] MD [facility]. Office # [phone] Pager # [phone] 11:37 AM

## 2023-05-25 NOTE — Progress Notes (Signed)
Attempted to call [facility] x2 with no answer.  Voicemail left

## 2023-05-25 DEATH — deceased

## 2023-06-13 ENCOUNTER — Ambulatory Visit (INDEPENDENT_AMBULATORY_CARE_PROVIDER_SITE_OTHER): Payer: Managed Care, Other (non HMO) | Admitting: Vascular Surgery
